# Patient Record
Sex: Female | Born: 1980 | Race: White | Hispanic: No | Marital: Married | Smoking: Never smoker
Health system: Southern US, Community
[De-identification: ages and names within clinical notes are randomized; demographics above are authoritative.]

## PROBLEM LIST (undated history)

## (undated) DIAGNOSIS — N289 Disorder of kidney and ureter, unspecified: Secondary | ICD-10-CM

## (undated) DIAGNOSIS — I10 Essential (primary) hypertension: Secondary | ICD-10-CM

## (undated) DIAGNOSIS — I340 Nonrheumatic mitral (valve) insufficiency: Secondary | ICD-10-CM

## (undated) DIAGNOSIS — G43909 Migraine, unspecified, not intractable, without status migrainosus: Secondary | ICD-10-CM

## (undated) DIAGNOSIS — N200 Calculus of kidney: Secondary | ICD-10-CM

## (undated) DIAGNOSIS — F419 Anxiety disorder, unspecified: Secondary | ICD-10-CM

## (undated) DIAGNOSIS — I071 Rheumatic tricuspid insufficiency: Secondary | ICD-10-CM

## (undated) HISTORY — PX: LITHOTRIPSY: SUR834

## (undated) HISTORY — DX: Anxiety disorder, unspecified: F41.9

---
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
## 2002-02-21 ENCOUNTER — Emergency Department (HOSPITAL_COMMUNITY): Admission: EM | Admit: 2002-02-21 | Discharge: 2002-02-21 | Payer: Self-pay | Admitting: Emergency Medicine

## 2002-12-22 ENCOUNTER — Emergency Department (HOSPITAL_COMMUNITY): Admission: EM | Admit: 2002-12-22 | Discharge: 2002-12-22 | Payer: Self-pay | Admitting: Emergency Medicine

## 2003-11-06 ENCOUNTER — Other Ambulatory Visit: Admission: RE | Admit: 2003-11-06 | Discharge: 2003-11-06 | Payer: Self-pay | Admitting: Obstetrics and Gynecology

## 2006-06-14 ENCOUNTER — Other Ambulatory Visit: Admission: RE | Admit: 2006-06-14 | Discharge: 2006-06-14 | Payer: Self-pay | Admitting: Obstetrics and Gynecology

## 2006-08-02 ENCOUNTER — Emergency Department (HOSPITAL_COMMUNITY): Admission: EM | Admit: 2006-08-02 | Discharge: 2006-08-02 | Payer: Self-pay | Admitting: Emergency Medicine

## 2006-08-30 ENCOUNTER — Ambulatory Visit (HOSPITAL_COMMUNITY): Admission: RE | Admit: 2006-08-30 | Discharge: 2006-08-30 | Payer: Self-pay | Admitting: Obstetrics and Gynecology

## 2006-09-04 ENCOUNTER — Emergency Department (HOSPITAL_COMMUNITY): Admission: EM | Admit: 2006-09-04 | Discharge: 2006-09-04 | Payer: Self-pay | Admitting: Emergency Medicine

## 2006-09-05 ENCOUNTER — Emergency Department (HOSPITAL_COMMUNITY): Admission: EM | Admit: 2006-09-05 | Discharge: 2006-09-05 | Payer: Self-pay | Admitting: Emergency Medicine

## 2007-01-07 ENCOUNTER — Inpatient Hospital Stay (HOSPITAL_COMMUNITY): Admission: AD | Admit: 2007-01-07 | Discharge: 2007-01-12 | Payer: Self-pay | Admitting: Obstetrics and Gynecology

## 2007-01-09 DIAGNOSIS — O1490 Unspecified pre-eclampsia, unspecified trimester: Secondary | ICD-10-CM

## 2007-01-17 ENCOUNTER — Inpatient Hospital Stay (HOSPITAL_COMMUNITY): Admission: AD | Admit: 2007-01-17 | Discharge: 2007-01-17 | Payer: Self-pay | Admitting: Obstetrics and Gynecology

## 2007-01-22 ENCOUNTER — Observation Stay (HOSPITAL_COMMUNITY): Admission: AD | Admit: 2007-01-22 | Discharge: 2007-01-23 | Payer: Self-pay | Admitting: Obstetrics and Gynecology

## 2007-02-20 ENCOUNTER — Other Ambulatory Visit: Admission: RE | Admit: 2007-02-20 | Discharge: 2007-02-20 | Payer: Self-pay | Admitting: Obstetrics and Gynecology

## 2007-06-15 ENCOUNTER — Emergency Department (HOSPITAL_COMMUNITY): Admission: EM | Admit: 2007-06-15 | Discharge: 2007-06-15 | Payer: Self-pay | Admitting: Emergency Medicine

## 2007-11-24 ENCOUNTER — Emergency Department (HOSPITAL_COMMUNITY): Admission: EM | Admit: 2007-11-24 | Discharge: 2007-11-24 | Payer: Self-pay | Admitting: Emergency Medicine

## 2007-12-15 ENCOUNTER — Emergency Department (HOSPITAL_COMMUNITY): Admission: EM | Admit: 2007-12-15 | Discharge: 2007-12-15 | Payer: Self-pay | Admitting: Emergency Medicine

## 2007-12-19 ENCOUNTER — Inpatient Hospital Stay (HOSPITAL_COMMUNITY): Admission: EM | Admit: 2007-12-19 | Discharge: 2007-12-21 | Payer: Self-pay | Admitting: Urology

## 2008-01-11 ENCOUNTER — Ambulatory Visit (HOSPITAL_BASED_OUTPATIENT_CLINIC_OR_DEPARTMENT_OTHER): Admission: RE | Admit: 2008-01-11 | Discharge: 2008-01-11 | Payer: Self-pay | Admitting: Urology

## 2008-04-10 ENCOUNTER — Emergency Department (HOSPITAL_COMMUNITY): Admission: EM | Admit: 2008-04-10 | Discharge: 2008-04-10 | Payer: Self-pay | Admitting: Emergency Medicine

## 2008-04-11 ENCOUNTER — Emergency Department (HOSPITAL_COMMUNITY): Admission: EM | Admit: 2008-04-11 | Discharge: 2008-04-11 | Payer: Self-pay | Admitting: Emergency Medicine

## 2008-04-15 ENCOUNTER — Emergency Department (HOSPITAL_COMMUNITY): Admission: EM | Admit: 2008-04-15 | Discharge: 2008-04-15 | Payer: Self-pay | Admitting: Emergency Medicine

## 2008-04-16 ENCOUNTER — Inpatient Hospital Stay (HOSPITAL_COMMUNITY): Admission: AD | Admit: 2008-04-16 | Discharge: 2008-04-21 | Payer: Self-pay | Admitting: Obstetrics and Gynecology

## 2008-04-16 ENCOUNTER — Encounter: Payer: Self-pay | Admitting: Emergency Medicine

## 2008-05-08 ENCOUNTER — Ambulatory Visit: Payer: Self-pay | Admitting: Obstetrics and Gynecology

## 2008-05-09 ENCOUNTER — Encounter: Payer: Self-pay | Admitting: Obstetrics and Gynecology

## 2008-05-09 LAB — CONVERTED CEMR LAB
HCT: 38.2 % (ref 36.0–46.0)
Hemoglobin: 12.8 g/dL (ref 12.0–15.0)
MCV: 91 fL (ref 78.0–100.0)
RDW: 14.2 % (ref 11.5–15.5)

## 2008-05-29 ENCOUNTER — Ambulatory Visit (HOSPITAL_COMMUNITY): Admission: RE | Admit: 2008-05-29 | Discharge: 2008-05-29 | Payer: Self-pay | Admitting: Family Medicine

## 2008-09-16 ENCOUNTER — Emergency Department (HOSPITAL_COMMUNITY): Admission: EM | Admit: 2008-09-16 | Discharge: 2008-09-17 | Payer: Self-pay | Admitting: Emergency Medicine

## 2008-09-24 ENCOUNTER — Emergency Department (HOSPITAL_COMMUNITY): Admission: EM | Admit: 2008-09-24 | Discharge: 2008-09-24 | Payer: Self-pay | Admitting: Emergency Medicine

## 2008-12-13 ENCOUNTER — Emergency Department (HOSPITAL_COMMUNITY): Admission: EM | Admit: 2008-12-13 | Discharge: 2008-12-13 | Payer: Self-pay | Admitting: Emergency Medicine

## 2009-04-13 ENCOUNTER — Inpatient Hospital Stay (HOSPITAL_COMMUNITY): Admission: AD | Admit: 2009-04-13 | Discharge: 2009-04-13 | Payer: Self-pay | Admitting: Obstetrics and Gynecology

## 2009-05-07 ENCOUNTER — Emergency Department (HOSPITAL_COMMUNITY): Admission: EM | Admit: 2009-05-07 | Discharge: 2009-05-07 | Payer: Self-pay | Admitting: Emergency Medicine

## 2009-05-20 ENCOUNTER — Ambulatory Visit: Payer: Self-pay | Admitting: Obstetrics and Gynecology

## 2009-05-21 ENCOUNTER — Emergency Department (HOSPITAL_COMMUNITY): Admission: EM | Admit: 2009-05-21 | Discharge: 2009-05-21 | Payer: Self-pay | Admitting: Emergency Medicine

## 2009-10-09 IMAGING — CR DG CHEST 2V
2 series · 2 of 2 positions shown · non-contrast
Comparison: None

CLINICAL DATA: Elevated blood pressure, dizziness and headaches.

CHEST - 2 VIEW

[w chest pa]
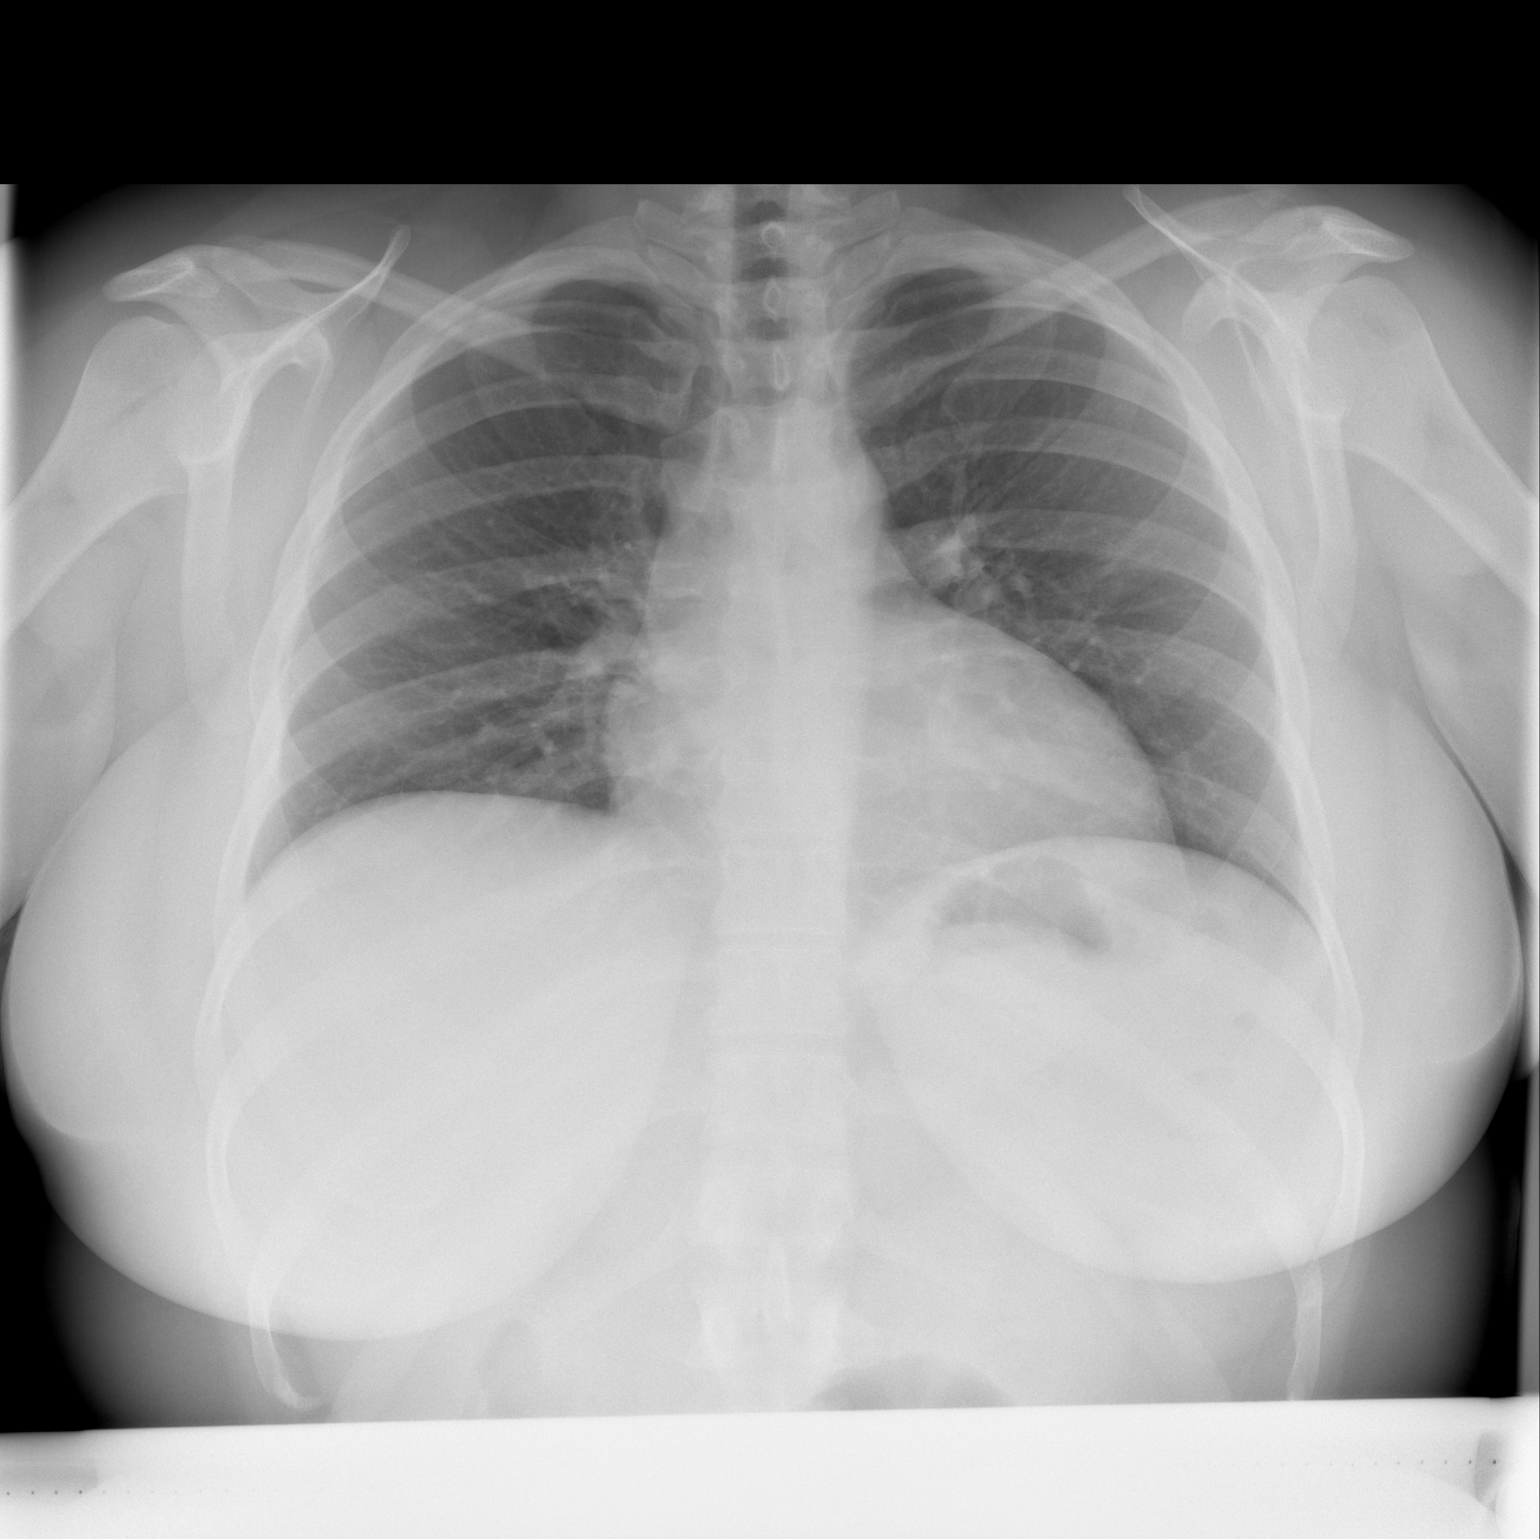

[w chest lat]
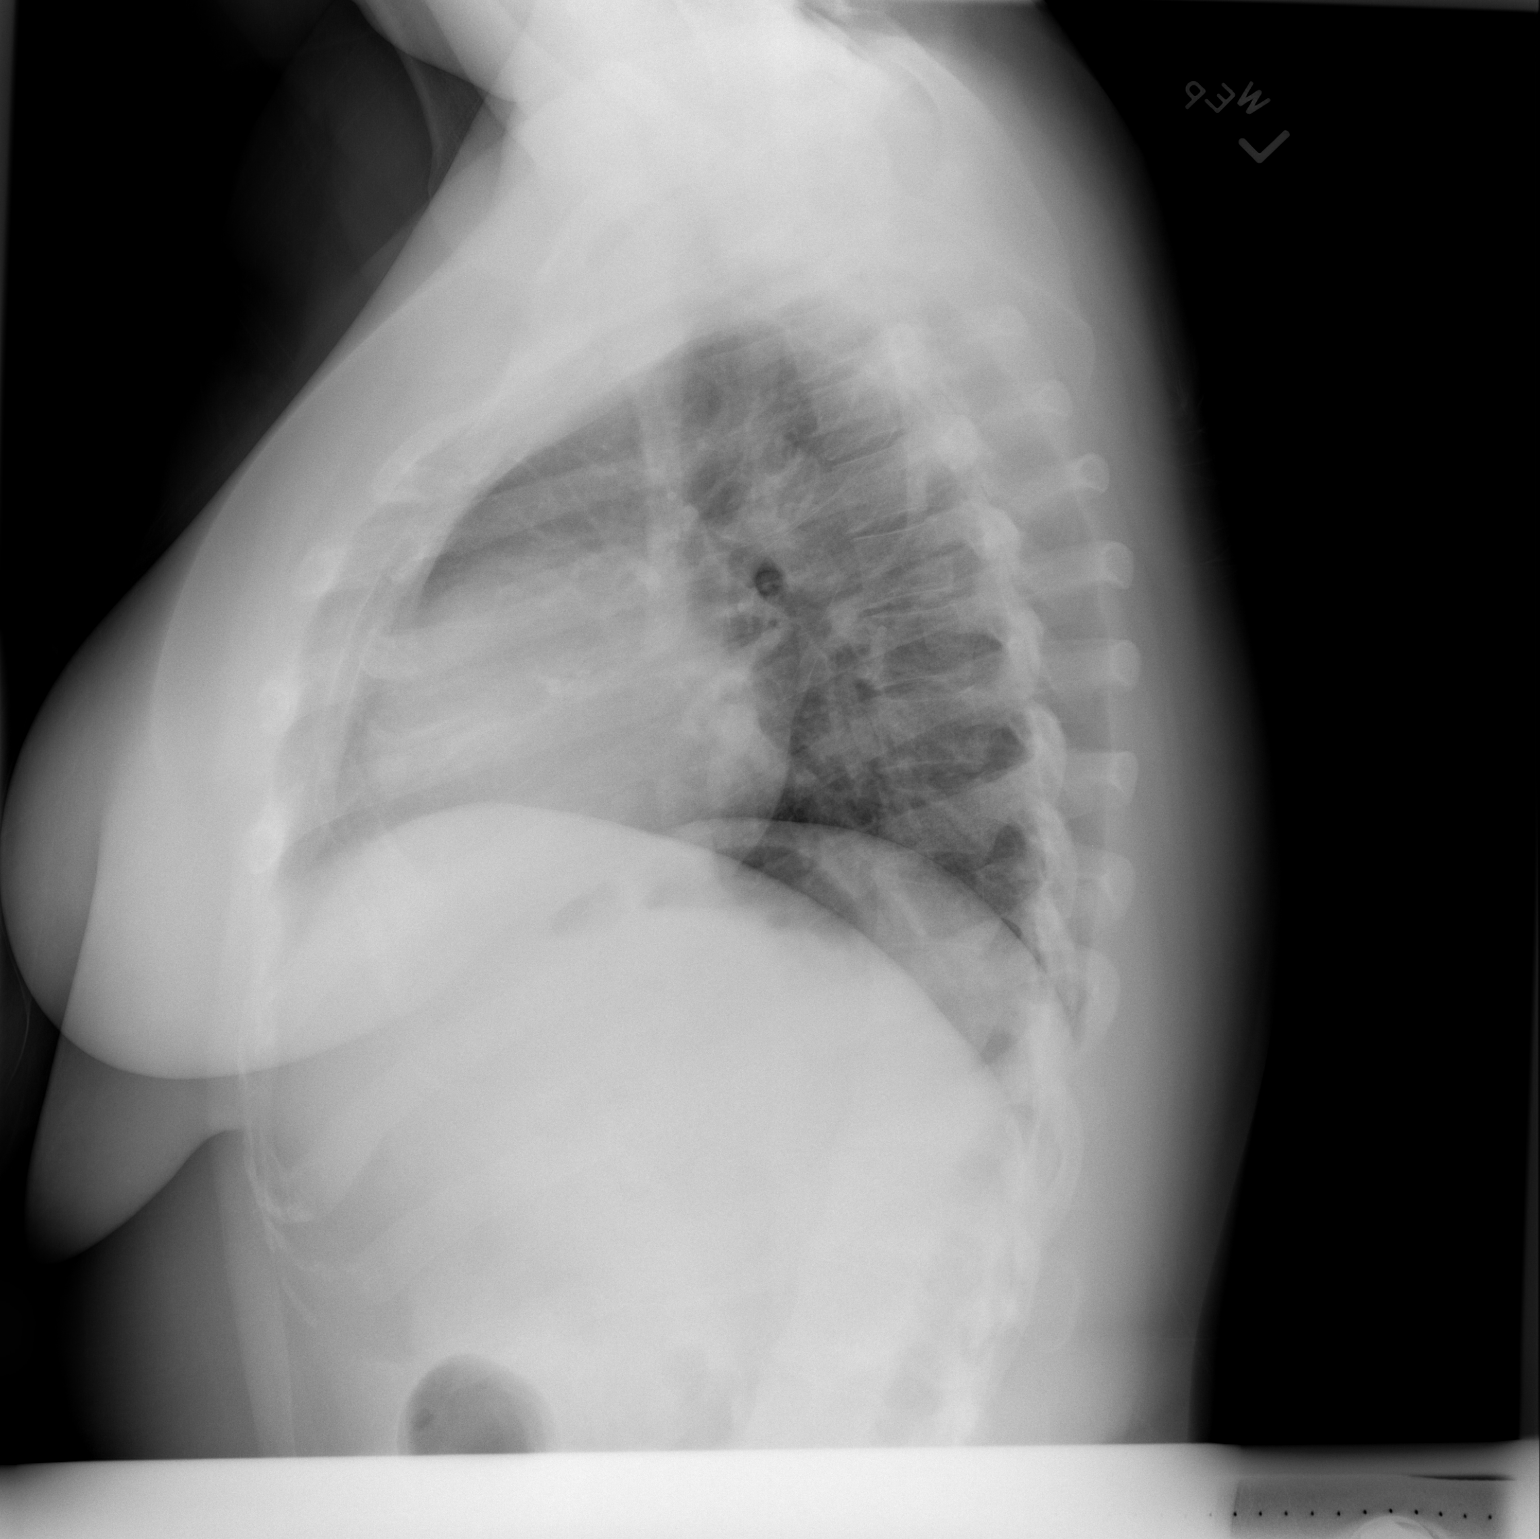

[2 of 2 positions shown; findings below may reference images not displayed]

FINDINGS: There are low lung volumes.  The heart size and
mediastinal contours are normal.  The lungs are clear.  There is no
pleural effusion or pneumothorax.  No acute osseous findings are
demonstrated.
IMPRESSION: No active cardiopulmonary process.

## 2010-01-05 IMAGING — CR DG CHEST 2V
2 series · 2 of 2 positions shown · non-contrast
Comparison: 09/16/2008.

CLINICAL DATA: 27-year-old female with right breast pain for 2
days.

CHEST - 2 VIEW

[w chest pa]
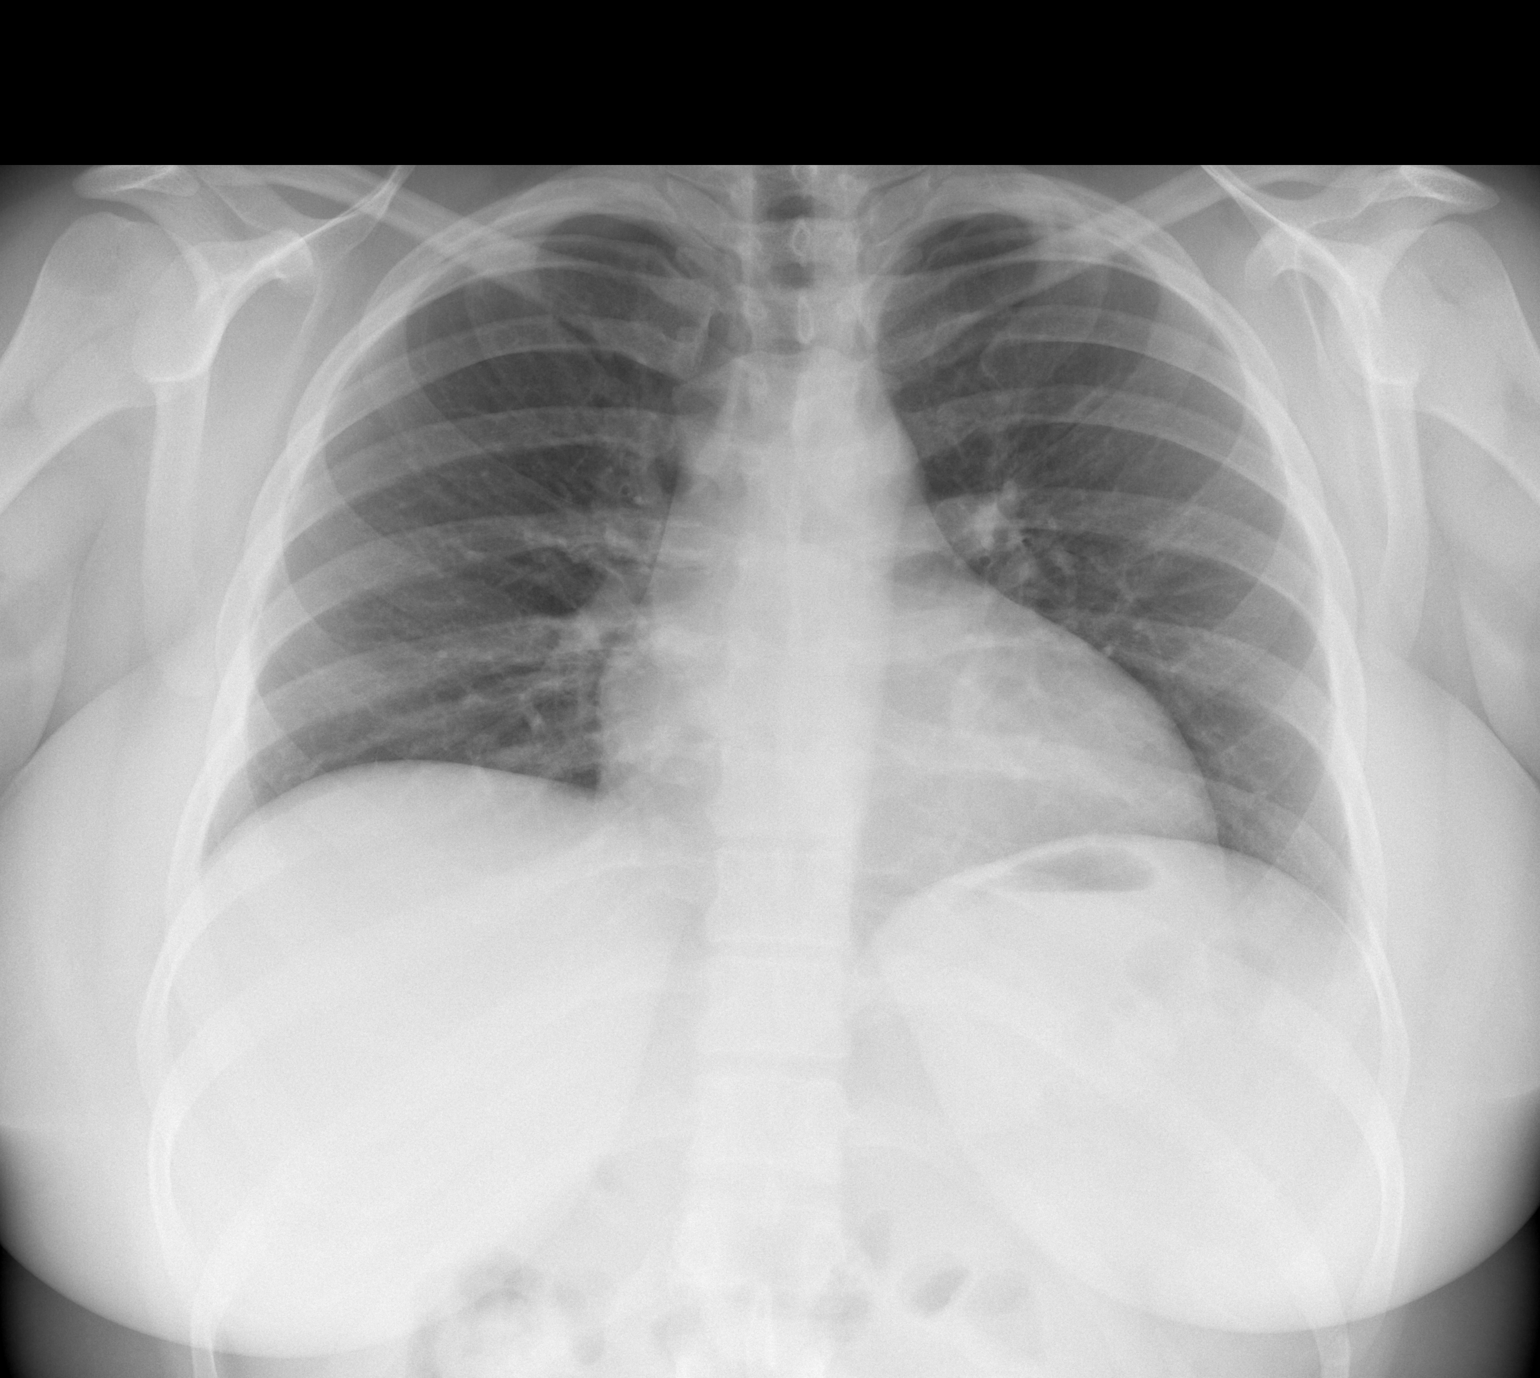

[w chest lat]
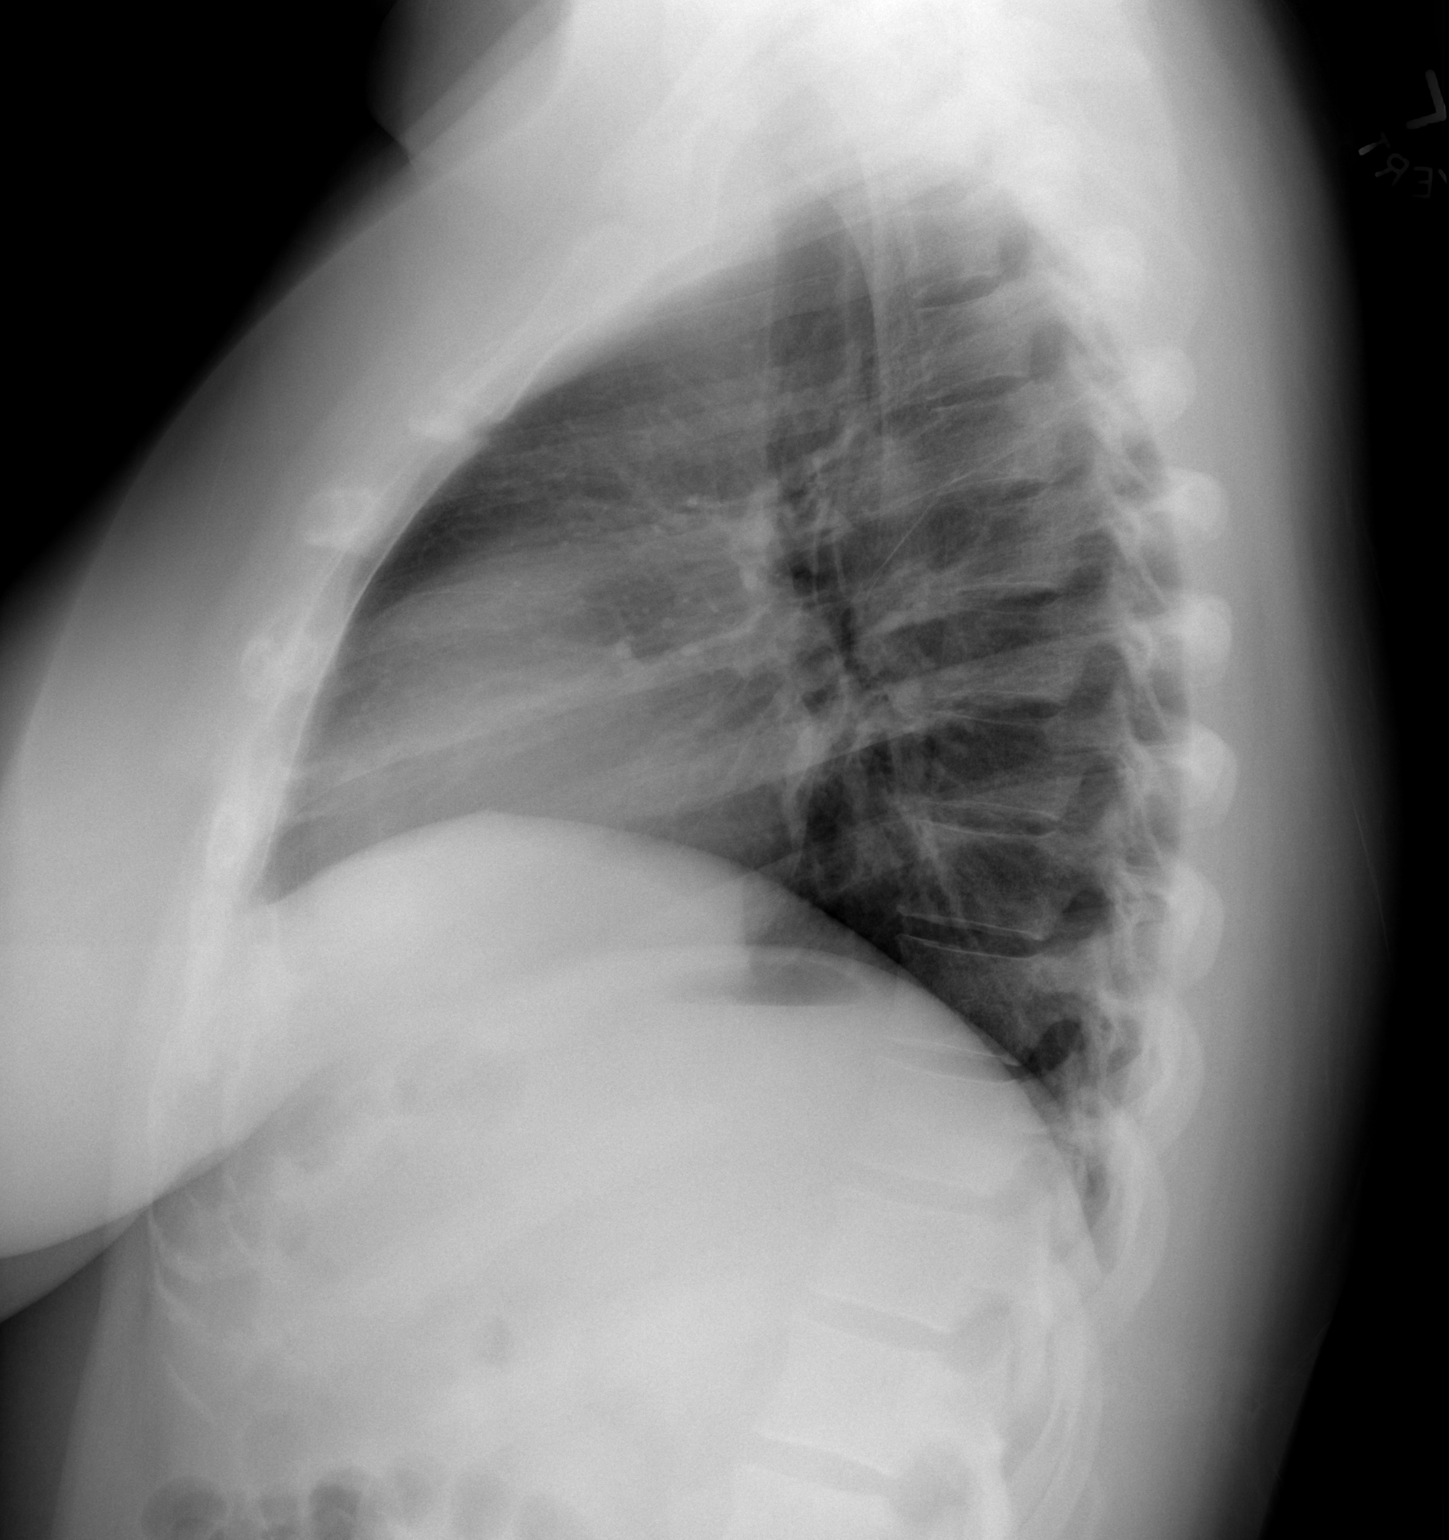

[2 of 2 positions shown; findings below may reference images not displayed]

FINDINGS: Stable low lung volumes.  Cardiac size and mediastinal
contours are within normal limits.  Visualized tracheal air column
is within normal limits.  The lungs remain clear.  No pneumothorax.
No acute osseous abnormality identified.
IMPRESSION: No acute cardiopulmonary abnormality.

## 2010-04-25 ENCOUNTER — Encounter: Payer: Self-pay | Admitting: *Deleted

## 2010-04-26 ENCOUNTER — Encounter: Payer: Self-pay | Admitting: Urology

## 2010-04-28 ENCOUNTER — Emergency Department (HOSPITAL_COMMUNITY)
Admission: EM | Admit: 2010-04-28 | Discharge: 2010-04-28 | Payer: Self-pay | Source: Home / Self Care | Admitting: Emergency Medicine

## 2010-04-28 LAB — RAPID STREP SCREEN (MED CTR MEBANE ONLY): Streptococcus, Group A Screen (Direct): NEGATIVE

## 2010-06-20 LAB — WET PREP, GENITAL
Trich, Wet Prep: NONE SEEN
Yeast Wet Prep HPF POC: NONE SEEN

## 2010-06-20 LAB — URINALYSIS, ROUTINE W REFLEX MICROSCOPIC
Glucose, UA: NEGATIVE mg/dL
Ketones, ur: NEGATIVE mg/dL
Leukocytes, UA: NEGATIVE
pH: 7 (ref 5.0–8.0)

## 2010-06-20 LAB — URINE MICROSCOPIC-ADD ON

## 2010-06-20 LAB — GC/CHLAMYDIA PROBE AMP, GENITAL: GC Probe Amp, Genital: NEGATIVE

## 2010-06-20 LAB — CBC
Hemoglobin: 13.7 g/dL (ref 12.0–15.0)
MCV: 96.2 fL (ref 78.0–100.0)
RBC: 4.16 MIL/uL (ref 3.87–5.11)
WBC: 6.1 10*3/uL (ref 4.0–10.5)

## 2010-06-20 LAB — HCG, QUANTITATIVE, PREGNANCY: hCG, Beta Chain, Quant, S: <2 m[IU]/mL (ref <5)

## 2010-06-23 LAB — URINALYSIS, ROUTINE W REFLEX MICROSCOPIC
Bilirubin Urine: NEGATIVE
Glucose, UA: NEGATIVE mg/dL
Hgb urine dipstick: NEGATIVE
Hgb urine dipstick: NEGATIVE
Ketones, ur: NEGATIVE mg/dL
Nitrite: NEGATIVE
Protein, ur: NEGATIVE mg/dL
Specific Gravity, Urine: 1.023 (ref 1.005–1.030)
Urobilinogen, UA: 1 mg/dL (ref 0.0–1.0)

## 2010-06-23 LAB — URINE MICROSCOPIC-ADD ON

## 2010-06-23 LAB — COMPREHENSIVE METABOLIC PANEL
Alkaline Phosphatase: 97 U/L (ref 39–117)
BUN: 13 mg/dL (ref 6–23)
Calcium: 8.9 mg/dL (ref 8.4–10.5)
Glucose, Bld: 91 mg/dL (ref 70–99)
Potassium: 4.2 mEq/L (ref 3.5–5.1)
Total Protein: 6.9 g/dL (ref 6.0–8.3)

## 2010-06-23 LAB — CBC
HCT: 39.9 % (ref 36.0–46.0)
Hemoglobin: 13.9 g/dL (ref 12.0–15.0)
MCHC: 34.8 g/dL (ref 30.0–36.0)
MCV: 97.9 fL (ref 78.0–100.0)
RDW: 13 % (ref 11.5–15.5)

## 2010-06-23 LAB — DIFFERENTIAL
Basophils Relative: 0 % (ref 0–1)
Monocytes Relative: 6 % (ref 3–12)
Neutro Abs: 5.2 10*3/uL (ref 1.7–7.7)
Neutrophils Relative %: 62 % (ref 43–77)

## 2010-06-23 LAB — POCT PREGNANCY, URINE: Preg Test, Ur: NEGATIVE

## 2010-07-09 ENCOUNTER — Emergency Department (HOSPITAL_COMMUNITY): Payer: BC Managed Care – PPO

## 2010-07-09 ENCOUNTER — Emergency Department (HOSPITAL_COMMUNITY)
Admission: EM | Admit: 2010-07-09 | Discharge: 2010-07-09 | Disposition: A | Discharge status: Home / Self Care | Payer: BC Managed Care – PPO | Attending: Emergency Medicine | Admitting: Emergency Medicine

## 2010-07-09 DIAGNOSIS — R109 Unspecified abdominal pain: Secondary | ICD-10-CM | POA: Insufficient documentation

## 2010-07-09 DIAGNOSIS — I10 Essential (primary) hypertension: Secondary | ICD-10-CM | POA: Insufficient documentation

## 2010-07-09 DIAGNOSIS — N201 Calculus of ureter: Secondary | ICD-10-CM | POA: Insufficient documentation

## 2010-07-09 LAB — BASIC METABOLIC PANEL
CO2: 25 mEq/L (ref 19–32)
Calcium: 9.3 mg/dL (ref 8.4–10.5)
Chloride: 104 mEq/L (ref 96–112)
GFR calc Af Amer: >60 mL/min (ref >60)
Sodium: 138 mEq/L (ref 135–145)

## 2010-07-09 LAB — CBC
Hemoglobin: 13.8 g/dL (ref 12.0–15.0)
MCHC: 32.8 g/dL (ref 30.0–36.0)
Platelets: 307 10*3/uL (ref 150–400)
RBC: 4.43 MIL/uL (ref 3.87–5.11)

## 2010-07-09 LAB — URINALYSIS, ROUTINE W REFLEX MICROSCOPIC
Bilirubin Urine: NEGATIVE
Glucose, UA: NEGATIVE mg/dL
Specific Gravity, Urine: 1.022 (ref 1.005–1.030)
pH: 6.5 (ref 5.0–8.0)

## 2010-07-09 LAB — DIFFERENTIAL
Basophils Absolute: 0 10*3/uL (ref 0.0–0.1)
Basophils Relative: 0 % (ref 0–1)
Neutro Abs: 6.3 10*3/uL (ref 1.7–7.7)
Neutrophils Relative %: 60 % (ref 43–77)

## 2010-07-09 LAB — URINE MICROSCOPIC-ADD ON

## 2010-07-10 LAB — URINE CULTURE
Colony Count: 60000
Culture  Setup Time: 201204060837

## 2010-07-12 LAB — DIFFERENTIAL
Basophils Absolute: 0 10*3/uL (ref 0.0–0.1)
Basophils Absolute: 0.1 10*3/uL (ref 0.0–0.1)
Basophils Relative: 1 % (ref 0–1)
Eosinophils Absolute: 0.1 10*3/uL (ref 0.0–0.7)
Eosinophils Absolute: 0.1 10*3/uL (ref 0.0–0.7)
Eosinophils Relative: 1 % (ref 0–5)
Eosinophils Relative: 1 % (ref 0–5)
Lymphocytes Relative: 33 % (ref 12–46)
Monocytes Absolute: 0.5 10*3/uL (ref 0.1–1.0)
Monocytes Relative: 6 % (ref 3–12)
Neutro Abs: 5 10*3/uL (ref 1.7–7.7)
Neutrophils Relative %: 60 % (ref 43–77)

## 2010-07-12 LAB — COMPREHENSIVE METABOLIC PANEL
ALT: 32 U/L (ref 0–35)
AST: 31 U/L (ref 0–37)
Albumin: 3.8 g/dL (ref 3.5–5.2)
Alkaline Phosphatase: 120 U/L — ABNORMAL HIGH (ref 39–117)
Chloride: 104 mEq/L (ref 96–112)
GFR calc Af Amer: >60 mL/min (ref >60)
Potassium: 3.5 mEq/L (ref 3.5–5.1)
Sodium: 138 mEq/L (ref 135–145)
Total Bilirubin: 0.3 mg/dL (ref 0.3–1.2)
Total Protein: 7.2 g/dL (ref 6.0–8.3)

## 2010-07-12 LAB — POCT I-STAT, CHEM 8
Calcium, Ion: 1.17 mmol/L (ref 1.12–1.32)
Calcium, Ion: 1.04 mmol/L — ABNORMAL LOW (ref 1.12–1.32)
Chloride: 103 mEq/L (ref 96–112)
Glucose, Bld: 100 mg/dL — ABNORMAL HIGH (ref 70–99)
HCT: 40 % (ref 36.0–46.0)
HCT: 43 % (ref 36.0–46.0)
Sodium: 136 mEq/L (ref 135–145)
TCO2: 27 mmol/L (ref 0–100)
TCO2: 21 mmol/L (ref 0–100)

## 2010-07-12 LAB — CBC
HCT: 38.7 % (ref 36.0–46.0)
HCT: 41.9 % (ref 36.0–46.0)
MCV: 92.6 fL (ref 78.0–100.0)
Platelets: 285 10*3/uL (ref 150–400)
Platelets: 309 10*3/uL (ref 150–400)
RDW: 13.9 % (ref 11.5–15.5)
RDW: 14 % (ref 11.5–15.5)
WBC: 7.9 10*3/uL (ref 4.0–10.5)

## 2010-07-12 LAB — URINALYSIS, ROUTINE W REFLEX MICROSCOPIC
Bilirubin Urine: NEGATIVE
Ketones, ur: NEGATIVE mg/dL
Nitrite: NEGATIVE
Specific Gravity, Urine: 1.006 (ref 1.005–1.030)
pH: 7 (ref 5.0–8.0)

## 2010-07-12 LAB — URINE MICROSCOPIC-ADD ON

## 2010-07-12 LAB — PREGNANCY, URINE: Preg Test, Ur: NEGATIVE

## 2010-07-12 LAB — POCT PREGNANCY, URINE: Preg Test, Ur: NEGATIVE

## 2010-07-13 ENCOUNTER — Emergency Department (HOSPITAL_COMMUNITY)
Admission: EM | Admit: 2010-07-13 | Discharge: 2010-07-13 | Disposition: A | Discharge status: Home / Self Care | Payer: BC Managed Care – PPO | Attending: Emergency Medicine | Admitting: Emergency Medicine

## 2010-07-13 DIAGNOSIS — R112 Nausea with vomiting, unspecified: Secondary | ICD-10-CM | POA: Insufficient documentation

## 2010-07-13 DIAGNOSIS — Z87442 Personal history of urinary calculi: Secondary | ICD-10-CM | POA: Insufficient documentation

## 2010-07-13 DIAGNOSIS — N200 Calculus of kidney: Secondary | ICD-10-CM | POA: Insufficient documentation

## 2010-07-13 DIAGNOSIS — N390 Urinary tract infection, site not specified: Secondary | ICD-10-CM | POA: Insufficient documentation

## 2010-07-13 DIAGNOSIS — I10 Essential (primary) hypertension: Secondary | ICD-10-CM | POA: Insufficient documentation

## 2010-07-13 DIAGNOSIS — R109 Unspecified abdominal pain: Secondary | ICD-10-CM | POA: Insufficient documentation

## 2010-07-13 LAB — CBC
MCHC: 34 g/dL (ref 30.0–36.0)
Platelets: 303 10*3/uL (ref 150–400)
RDW: 13 % (ref 11.5–15.5)
WBC: 12.4 10*3/uL — ABNORMAL HIGH (ref 4.0–10.5)

## 2010-07-13 LAB — POCT PREGNANCY, URINE: Preg Test, Ur: NEGATIVE

## 2010-07-13 LAB — URINALYSIS, ROUTINE W REFLEX MICROSCOPIC
Nitrite: NEGATIVE
Protein, ur: 30 mg/dL — AB
Urobilinogen, UA: 0.2 mg/dL (ref 0.0–1.0)

## 2010-07-13 LAB — POCT I-STAT, CHEM 8
HCT: 42 % (ref 36.0–46.0)
Hemoglobin: 14.3 g/dL (ref 12.0–15.0)
Potassium: 4.1 mEq/L (ref 3.5–5.1)
TCO2: 24 mmol/L (ref 0–100)

## 2010-07-13 LAB — URINE MICROSCOPIC-ADD ON

## 2010-07-15 ENCOUNTER — Emergency Department (HOSPITAL_COMMUNITY)
Admission: EM | Admit: 2010-07-15 | Discharge: 2010-07-15 | Disposition: A | Discharge status: Home / Self Care | Payer: BC Managed Care – PPO | Attending: Emergency Medicine | Admitting: Emergency Medicine

## 2010-07-15 ENCOUNTER — Emergency Department (HOSPITAL_COMMUNITY): Payer: BC Managed Care – PPO

## 2010-07-15 DIAGNOSIS — R109 Unspecified abdominal pain: Secondary | ICD-10-CM | POA: Insufficient documentation

## 2010-07-15 DIAGNOSIS — I10 Essential (primary) hypertension: Secondary | ICD-10-CM | POA: Insufficient documentation

## 2010-07-15 DIAGNOSIS — R112 Nausea with vomiting, unspecified: Secondary | ICD-10-CM | POA: Insufficient documentation

## 2010-07-15 DIAGNOSIS — N200 Calculus of kidney: Secondary | ICD-10-CM | POA: Insufficient documentation

## 2010-07-15 DIAGNOSIS — Z87442 Personal history of urinary calculi: Secondary | ICD-10-CM | POA: Insufficient documentation

## 2010-07-15 LAB — BASIC METABOLIC PANEL
Calcium: 9.1 mg/dL (ref 8.4–10.5)
GFR calc Af Amer: 59 mL/min — ABNORMAL LOW (ref >60)
GFR calc non Af Amer: 49 mL/min — ABNORMAL LOW (ref >60)
Glucose, Bld: 97 mg/dL (ref 70–99)
Potassium: 3.6 mEq/L (ref 3.5–5.1)
Sodium: 138 mEq/L (ref 135–145)

## 2010-07-15 LAB — URINALYSIS, ROUTINE W REFLEX MICROSCOPIC
Nitrite: NEGATIVE
Specific Gravity, Urine: 1.021 (ref 1.005–1.030)
Urobilinogen, UA: 0.2 mg/dL (ref 0.0–1.0)
pH: 6.5 (ref 5.0–8.0)

## 2010-07-15 LAB — URINE MICROSCOPIC-ADD ON

## 2010-07-15 LAB — DIFFERENTIAL
Basophils Absolute: 0 10*3/uL (ref 0.0–0.1)
Basophils Relative: 0 % (ref 0–1)
Eosinophils Relative: 1 % (ref 0–5)
Lymphocytes Relative: 25 % (ref 12–46)
Monocytes Absolute: 0.7 10*3/uL (ref 0.1–1.0)
Neutro Abs: 6.2 10*3/uL (ref 1.7–7.7)

## 2010-07-15 LAB — CBC
HCT: 39 % (ref 36.0–46.0)
Hemoglobin: 13.1 g/dL (ref 12.0–15.0)
MCHC: 33.6 g/dL (ref 30.0–36.0)
RDW: 13.2 % (ref 11.5–15.5)
WBC: 9.4 10*3/uL (ref 4.0–10.5)

## 2010-07-16 LAB — URINE CULTURE: Colony Count: 35000

## 2010-07-19 LAB — WET PREP, GENITAL
Trich, Wet Prep: NONE SEEN
WBC, Wet Prep HPF POC: NONE SEEN

## 2010-07-19 LAB — URINALYSIS, ROUTINE W REFLEX MICROSCOPIC
Bilirubin Urine: NEGATIVE
Hgb urine dipstick: NEGATIVE
Ketones, ur: NEGATIVE mg/dL
Nitrite: NEGATIVE
Nitrite: NEGATIVE
Nitrite: NEGATIVE
Protein, ur: NEGATIVE mg/dL
Specific Gravity, Urine: 1.012 (ref 1.005–1.030)
Specific Gravity, Urine: 1.023 (ref 1.005–1.030)
Urobilinogen, UA: 0.2 mg/dL (ref 0.0–1.0)
Urobilinogen, UA: 0.2 mg/dL (ref 0.0–1.0)
Urobilinogen, UA: 1 mg/dL (ref 0.0–1.0)

## 2010-07-19 LAB — DIFFERENTIAL
Basophils Absolute: 0 10*3/uL (ref 0.0–0.1)
Basophils Absolute: 0 10*3/uL (ref 0.0–0.1)
Basophils Relative: 0 % (ref 0–1)
Basophils Relative: 0 % (ref 0–1)
Eosinophils Absolute: 0 10*3/uL (ref 0.0–0.7)
Eosinophils Absolute: 0 10*3/uL (ref 0.0–0.7)
Eosinophils Absolute: 0 10*3/uL (ref 0.0–0.7)
Eosinophils Absolute: 0 10*3/uL (ref 0.0–0.7)
Eosinophils Relative: 0 % (ref 0–5)
Eosinophils Relative: 0 % (ref 0–5)
Eosinophils Relative: 0 % (ref 0–5)
Lymphocytes Relative: 15 % (ref 12–46)
Lymphocytes Relative: 10 % — ABNORMAL LOW (ref 12–46)
Lymphocytes Relative: 12 % (ref 12–46)
Lymphs Abs: 1 10*3/uL (ref 0.7–4.0)
Lymphs Abs: 1.1 10*3/uL (ref 0.7–4.0)
Monocytes Absolute: 0.6 10*3/uL (ref 0.1–1.0)
Monocytes Relative: 6 % (ref 3–12)
Monocytes Relative: 5 % (ref 3–12)
Monocytes Relative: 7 % (ref 3–12)
Neutro Abs: 11.4 10*3/uL — ABNORMAL HIGH (ref 1.7–7.7)
Neutrophils Relative %: 79 % — ABNORMAL HIGH (ref 43–77)
Neutrophils Relative %: 81 % — ABNORMAL HIGH (ref 43–77)
Neutrophils Relative %: 88 % — ABNORMAL HIGH (ref 43–77)
Neutrophils Relative %: 88 % — ABNORMAL HIGH (ref 43–77)

## 2010-07-19 LAB — COMPREHENSIVE METABOLIC PANEL
ALT: 26 U/L (ref 0–35)
AST: 23 U/L (ref 0–37)
Alkaline Phosphatase: 117 U/L (ref 39–117)
CO2: 25 mEq/L (ref 19–32)
Calcium: 9.1 mg/dL (ref 8.4–10.5)
GFR calc Af Amer: >60 mL/min (ref >60)
Glucose, Bld: 108 mg/dL — ABNORMAL HIGH (ref 70–99)
Potassium: 3.6 mEq/L (ref 3.5–5.1)
Sodium: 137 mEq/L (ref 135–145)
Total Protein: 6.9 g/dL (ref 6.0–8.3)

## 2010-07-19 LAB — URINE MICROSCOPIC-ADD ON

## 2010-07-19 LAB — BASIC METABOLIC PANEL
BUN: 10 mg/dL (ref 6–23)
BUN: 13 mg/dL (ref 6–23)
CO2: 26 mEq/L (ref 19–32)
Chloride: 102 mEq/L (ref 96–112)
Creatinine, Ser: 0.86 mg/dL (ref 0.4–1.2)
GFR calc non Af Amer: >60 mL/min (ref >60)
GFR calc non Af Amer: >60 mL/min (ref >60)
Glucose, Bld: 124 mg/dL — ABNORMAL HIGH (ref 70–99)
Glucose, Bld: 86 mg/dL (ref 70–99)
Potassium: 3.4 mEq/L — ABNORMAL LOW (ref 3.5–5.1)
Potassium: 3.6 mEq/L (ref 3.5–5.1)
Sodium: 137 mEq/L (ref 135–145)

## 2010-07-19 LAB — CBC
HCT: 31.5 % — ABNORMAL LOW (ref 36.0–46.0)
HCT: 36.4 % (ref 36.0–46.0)
HCT: 36.8 % (ref 36.0–46.0)
Hemoglobin: 13.2 g/dL (ref 12.0–15.0)
Hemoglobin: 10.6 g/dL — ABNORMAL LOW (ref 12.0–15.0)
Hemoglobin: 12.6 g/dL (ref 12.0–15.0)
Hemoglobin: 13.1 g/dL (ref 12.0–15.0)
MCHC: 33.4 g/dL (ref 30.0–36.0)
MCHC: 33.7 g/dL (ref 30.0–36.0)
MCHC: 34.2 g/dL (ref 30.0–36.0)
MCV: 90.4 fL (ref 78.0–100.0)
MCV: 90.8 fL (ref 78.0–100.0)
MCV: 92.3 fL (ref 78.0–100.0)
Platelets: 275 10*3/uL (ref 150–400)
Platelets: 303 10*3/uL (ref 150–400)
RBC: 4.32 MIL/uL (ref 3.87–5.11)
RBC: 3.42 MIL/uL — ABNORMAL LOW (ref 3.87–5.11)
RBC: 4.26 MIL/uL (ref 3.87–5.11)
RDW: 13.5 % (ref 11.5–15.5)
RDW: 13.8 % (ref 11.5–15.5)
RDW: 13.8 % (ref 11.5–15.5)
WBC: 14.5 10*3/uL — ABNORMAL HIGH (ref 4.0–10.5)
WBC: 9.8 10*3/uL (ref 4.0–10.5)

## 2010-07-19 LAB — URINE CULTURE: Colony Count: 35000

## 2010-07-19 LAB — POCT I-STAT, CHEM 8
HCT: 41 % (ref 36.0–46.0)
Hemoglobin: 13.9 g/dL (ref 12.0–15.0)
Potassium: 3.6 mEq/L (ref 3.5–5.1)
Sodium: 139 mEq/L (ref 135–145)
TCO2: 26 mmol/L (ref 0–100)

## 2010-07-19 LAB — PREGNANCY, URINE: Preg Test, Ur: NEGATIVE

## 2010-07-19 LAB — LIPASE, BLOOD: Lipase: 16 U/L (ref 11–59)

## 2010-07-19 LAB — POCT PREGNANCY, URINE: Preg Test, Ur: NEGATIVE

## 2010-08-17 NOTE — H&P (Signed)
NAMEJoneen Ramirez           ACCOUNT NO.:  [id number]   MEDICAL RECORD NO.:  [id number]          PATIENT TYPE:  OBV   LOCATION:  9305                          [facility]   PHYSICIAN:  James A. Ashley Royalty, M.D.DATE OF BIRTH:  Mar 15, 1981   DATE OF ADMISSION:  01/22/2007  DATE OF DISCHARGE:                              HISTORY & PHYSICAL   The patient is a 30 year old gravida 1, para 1 who recently delivered  vaginally January 09, 2007.  At the time of her delivery she carried a  diagnosis of preeclampsia, and was on magnesium sulfate prophylaxis.  She went home on-or-about January 12, 2007.  The patient returned to the  office January 17, 2007 for a blood pressure check.  She was noted to  be hypertensive and asked to return maternity admissions for further  evaluation and therapy.  She did return to maternity admissions, and  blood pressures were noted to be consistently elevated.  The patient was  asked to be placed on outpatient observation in order to address the  issue.  Unfortunately she chose to check out against medical advice.   The patient returned today, to the office, also for blood pressure  check.  At this time she complains of a headache.  She denies any visual  disturbance, right upper quadrant or epigastric pain.   MEDICATIONS:  None.   PAST HISTORY MEDICAL HISTORY:  Medical--history of kidney stones.  Surgical--negative.   ALLERGIES:  PERTUSSIS   FAMILY HISTORY:  Noncontributory.   SOCIAL HISTORY:  The patient denies the use of tobacco or significant  alcohol.   REVIEW OF SYSTEMS:  Noncontributory.   PHYSICAL EXAMINATION:  GENERAL:  A well-developed, well-nourished,  pleasant female in no acute distress.  VITAL SIGNS:  Blood pressure 162/114.  HEENT: Normocephalic.  NECK: Supple without thyromegaly.  CHEST:  Lungs are clear.  CARDIAC:  Regular rate and rhythm.  ABDOMEN:  Soft and nontender without masses or organomegaly.  PELVIC:  Examination  deferred.  MUSCULOSKELETAL:  No significant edema.  DTRs 2+ equal   IMPRESSION:  1. Status post vaginal delivery January 09, 2007.  2. History of preeclampsia with recent pregnancy.  3. Hypertension--chronic versus preeclampsia.   PLAN:  The patient, once again, asked to go to maternity admissions and  be placed on outpatient observation.  She states, that at this time she  will comply without request.  Will check additional labs and provide  antihypertensive medication as indicated.      James A. Ashley Royalty, M.D.  Electronically Signed     JAM/MEDQ  D:  01/22/2007  T:  01/23/2007  Job:  [id number]

## 2010-08-17 NOTE — Op Note (Signed)
Sara Ramirez, Sara Ramirez NO.:  [id number]   MEDICAL RECORD NO.:  [id number]          PATIENT TYPE:  INP   LOCATION:  1428                         FACILITY:  [facility]   PHYSICIAN:  Martina Sinner, MD DATE OF BIRTH:  30-Nov-1980   DATE OF PROCEDURE:  12/19/2007  DATE OF DISCHARGE:                               OPERATIVE REPORT   PREOPERATIVE DIAGNOSIS:  Infected right ureteral stone.   POSTOPERATIVE DIAGNOSIS:  Infected right ureteral stone.   SURGERY:  Cystoscopy, right retrograde ureterogram, insertion of right  ureteral stent.   HISTORY:  [name] has a 5-6 mm distal right ureteral vesical  stone.  She tried to pass over last month.  She developed a temperature  of 103 in the emergency room and she had refractory nausea and pain.   PROCEDURE IN DETAIL:  The patient was prepped and draped in the usual  fashion.  I initially scoped the patient with a 27 [place] scope.  She  was given IV Ancef and gentamicin and cultures were taken of the urine  and blood prior to surgery.  I inspected the bladder with a 21 [place]  scope.  There is no periureteral edema.  Bladder mucosa and trigone were  normal.  There is no stitch, foreign body or carcinoma.   Under cystoscopic and fluoroscopic guidance I gently passed a sensor  wire up to the mid ureter past the stone.  At first the sensor wire  would not go past the partially impacted stone.  Using a corkscrew type  maneuver gently it popped through.  I then passed an open-ended ureteral  catheter using the same maneuver to the mid ureter.  I removed the  sensor wire and did a gentle retrograde not to cause a back pressure  effect with infected urine.  I marked the level of the renal pelvis.  It  was not very dilated.  I then passed the sensor wire up into the upper  pole calyx.  There was a volcano like evacuation of bloody dark urine  from the stone with the wire in place.  I then placed under fluoroscopic  and  cystoscopic guidance quite easily a 24 cm x 6 French double-J stent.  I really did not leave much of a curl in the bladder and the stent just  reached beyond the ureteropelvic junction.  She had a small right renal  pelvis.  There is no question the stent was bridging the stone and I  suspect it will stay in good position.  She is only 5 foot 3 inches or 5  feet 4 inches and I was a bit surprised that the stent would not have  gone all the way into the renal pelvis or upper pole calyx.  The bladder  was emptied.  She had a little bit of bleeding around the ureter which  was minimal.  She was taken to the recovery room.  She will be admitted  and treated with IV antibiotics and observed for sepsis.           ______________________________  Martina Sinner, MD  Electronically Signed  SAM/MEDQ  D:  12/19/2007  T:  12/21/2007  Job:  [id number]

## 2010-08-17 NOTE — Discharge Summary (Signed)
NAMETERRIAN, RIDLON NO.:  [id number]   MEDICAL RECORD NO.:  [id number]          PATIENT TYPE:  INP   LOCATION:  9303                          [facility]ACILITY:  [facility]   PHYSICIAN:  Lazaro Arms, M.D.   DATE OF BIRTH:  [birth year]   DATE OF ADMISSION:  04/16/2008  DATE OF DISCHARGE:  04/21/2008                               DISCHARGE SUMMARY   DISCHARGE DIAGNOSES:  1. Left tuboovarian abscess.  2. Response to intravenous antibiotics.   Please refer to the history and physical for detail submission of the  hospital.   HOSPITAL COURSE:  The patient was admitted with significant left lower  quadrant pain and fever.  She was evaluated and emergently admitted by  Dr. Estanislado Pandy and she was found to have a left tuboovarian abscess 6.5 cm.  Her temperature was greater than 101 and her white blood cell count was  really pretty marginal at 9800 with minimally significant left shift  with 81% segs.  She was placed on Ceftin and Oxy and then transferred to  our service and was then placed on Zosyn.  I believe, she had a fever  elevation up to 100.7 in the morning of the 15, but has been afebrile  thereafter, daily.  Abdominal exams have been progressively better in  this morning, today.  Her discharge is basically benign.  She is eating  well, having no other complaints, and states she is having no need for  pain medicines as well.  She was discharged in the home in the morning  of the 18th, after 4+ days of having antibiotics, having been afebrile  for greater than 60 hours.  She is a short-time Cipro 500 mg take twice  a day for the next 14 days and will be followed up in the GYN clinic in  3 weeks.  She was given instructions and precautions for return to the  hospital prior to that time.      Lazaro Arms, M.D.  Electronically Signed     LHE/MEDQ  D:  04/21/2008  T:  04/21/2008  Job:  [id number]

## 2010-08-17 NOTE — Group Therapy Note (Signed)
NAMEALLIANA, MCAULIFF NO.:  [id number]   MEDICAL RECORD NO.:  [id number]          PATIENT TYPE:  WOC   LOCATION:  [facility]                   FACILITY:  [facility]   PHYSICIAN:  Argentina Donovan, MD        DATE OF BIRTH:  1980-07-11   DATE OF SERVICE:  05/08/2008                                  CLINIC NOTE   The patient is a 30 year old Caucasian female, gravida 1, para 1-0-0-1  who went in to the Maternity Admissions Office on April 16, 2008, was  admitted to the hospital with tubo-ovarian abscess, treated until  April 21, 2008, 5 days on IV antibiotics and sent home on Cipro for 10  days.  She has been asymptomatic since that time.  She came in today for  a followup.  We are going to follow her up at the end of February, i.e.  6 weeks from her acute episode with an ultrasound and have her come in  following that to evaluate her also with a CBC and a sed rate to  evaluate whether there is any sign of chronic inflammation at this  point.           ______________________________  Argentina Donovan, MD     PR/MEDQ  D:  05/08/2008  T:  05/09/2008  Job:  [id number]

## 2010-08-17 NOTE — Discharge Summary (Signed)
Sara Ramirez, Sara Ramirez NO.:  [id number]   MEDICAL RECORD NO.:  [id number]          PATIENT TYPE:  INP   LOCATION:  1428                         FACILITY:  [facility]   PHYSICIAN:  Martina Sinner, MD DATE OF BIRTH:  17-Jun-1980   DATE OF ADMISSION:  12/19/2007  DATE OF DISCHARGE:  12/21/2007                               DISCHARGE SUMMARY   ADMISSION DIAGNOSIS:  Right ureteral stone with probable urinary tract  infection.   DISCHARGE DIAGNOSIS:  Right ureteral stone with probable urinary tract  infection.   [name] tried to pass a stone over 1 month and went to the  [facility] emergency room on three occasions.  I was called when she was going  to have a distal symptomatic right ureterovesical stone.  She had  protracted vomiting and pain.  She was pale.  She spiked a temperature  to 103 when the emergency room, so she was taken to the operating room  and had a cystoscopy, retrograde urethrogram and placement of stent.   She did very well post procedure.  She was afebrile.  I kept her an  extra day to check her cultures and make certain she did not spike a  temperature.   Her vitals were good throughout her stay.  Her intake and output was  good.  Her white blood count was normal.  She had mixed organisms on  urine culture.   [name] was tolerating the stent very well.  Do's and don'ts were  discussed with the patient.  She will be seen in a week in clinic.  I  will get a KUB.  Will arrange ureteroscopy in approximately 2-3 weeks.  I sent her home with prescription of ciprofloxacin, and she had Percocet  from her previous emergency room visits.           ______________________________  Martina Sinner, MD  Electronically Signed     SAM/MEDQ  D:  12/21/2007  T:  12/23/2007  Job:  [id number]

## 2010-08-17 NOTE — Op Note (Signed)
NAMELEEONA, MCCARDLE         ACCOUNT NO.:  [id number]   MEDICAL RECORD NO.:  [id number]          PATIENT TYPE:  AMB   LOCATION:  N[facility]C                         FACILITY:  [facility]   PHYSICIAN:  Martina Sinner, MD DATE OF BIRTH:  25-Aug-1980   DATE OF PROCEDURE:  01/11/2008  DATE OF DISCHARGE:                               OPERATIVE REPORT   PREOPERATIVE DIAGNOSIS:  Right ureteral stone.   POSTOPERATIVE DIAGNOSIS:  Right ureteral stone.   PROCEDURE:  1. Cystourethroscopy.  2. Removal of right ureteral stent.  3. Intraoperative fluoroscopy with interpretation.  4. Right ureteroscopic stone manipulation with laser lithotripsy and      basket extraction.  5. Placement of right 6 x 24 contoured double-J stent placement.   ANESTHESIA:  General.   INDICATIONS FOR PROCEDURE:  [name] is a 30 year old female with  recent episode of renal colic.  She was taken to the operating room a  few weeks ago and had a ureteral stent placed.  She comes back today for  further definitive management of this stone.  Stone was not visible on  preoperative KUB.  Prior to the procedure, all risks, benefits,  consequences and concerns were discussed, and informed consent was  obtained.   PROCEDURE IN DETAIL:  The patient was brought to the operating room,  placed in supine position.  She was correctly identified by wristband,  and appropriate time-out was taken.  IV antibiotics were administered,  and general anesthesia was delivered.  Once adequately anesthetized, she  was placed in dorsal lithotomy position with great care taken to  minimize the risk of peripheral neuropathy or compartment syndrome.  Her  perineum was prepped and draped sterilely.  We began our procedure by  performing rigid cystourethroscopy with 22-French rigid cystoscopic  sheath and a 12-degree lens and normal saline as our irrigant.  Upon  entering the bladder, clear urine was identified.  No stone fragments  were  seen.  The distal curl of the right ureteral stent was grasped and  externalized with flexible graspers.  We then advanced a cystic  guidewire through the right ureteral stent into the right renal pelvis  using fluoroscopic guidance.  The stent was then removed completely.  We  then used a semi-rigid ureteroscope and easily cannulated the capacious  right ureteral orifice despite the marked bullous edema of the  urothelium surrounding the orifice.  We advanced the ureteroscope  without difficulty up to the level of the stone.  In the ureter just  distal to the stone was a small lip of mucosa.  There was no frank  stricture, but there was an obvious anatomic impediment to her stone  passage.  We then advanced a 360 nanometer laser fiber through the  ureteroscope and performed laser lithotripsy with settings of 5 Hz and  0.5 joules.  The laser was subsequently fragmented into multiple small  pieces.  We then used basket to remove all of the pieces completely.  Once this stone was completely removed, we then performed pan right  ureteroscopy, and no stone fragments were seen.  There was a little bit  of urothelial edema  at the site of the prior stone.  There was some  slight evidence that this may have been somewhat impacted.  There was no  perforation, no active bleeding, and likewise, we removed the  ureteroscope entirely.  We then back loaded the cystoscope over the  guide wire and successfully placed a right 6 x 24 contour double-J  stent.  We retrieved the stone  fragments from her bladder and emptied her bladder, and this marked the  end of our procedure.  She awoke from anesthesia and was taken to the  recovery room in stable condition.  There were no complications.  She  tolerated the procedure well.  Dr. Sherron Monday was present and  participated in all aspects of the case.     ______________________________  Delman Kitten, M.D.      Martina Sinner, MD  Electronically  Signed    DW/MEDQ  D:  01/11/2008  T:  01/12/2008  Job:  [id number]

## 2010-08-20 NOTE — Discharge Summary (Signed)
NAMEJoneen Ramirez           ACCOUNT NO.:  [id number]   MEDICAL RECORD NO.:  [id number]        PATIENT TYPE:  WOBV   LOCATION:                                [facility]   PHYSICIAN:  James A. Ashley Royalty, M.D.     DATE OF BIRTH:   DATE OF ADMISSION:  01/08/2007  DATE OF DISCHARGE:  01/12/2007                               DISCHARGE SUMMARY   DISCHARGE DIAGNOSES:  1. Intrauterine pregnancy at term, delivered.  2. Preeclampsia.  3. Anemia - asymptomatic.   CONSULTATIONS:  Dr. Rachel Bo (MFM).   OPERATIONS/SPECIAL PROCEDURES:  OB delivery with repair of first degree  laceration.   HISTORY OF PRESENT ILLNESS:  This is a 30 year old primigravida at 37  weeks 6 days gestation.  Prenatal care was complicated by a kidney stone  which was treated at [facility] as well as a positive chlamydia  culture which was also treated.  The patient presented to maternity  admissions complaining of headaches.  She denied other symptoms of  severe preeclampsia.  Blood pressures upon admission were 140/150  systolic and 90's diastolic.  Initial cervical examination per the nurse  revealed 1 cm dilatation and very high station.  The patient was  admitted to [facility] in order to observe the  patient for possible emerging preeclampsia.  For the remainder of the  History and Physical please see chart.   HOSPITAL COURSE:  The patient was admitted.  On January 08, 2007 the  patient was noted to have higher blood pressures.  A 24 urine had been  in progress, but January 08, 2007 the nurse reported that the urine was  inadvertently discarded by the nursing staff at approximately 6 p.m. or  perhaps at a later time as well.  The 24-hour urine was restarted at  approximately 9:10 p.m. on that date.  Due to the increasing blood  pressures the decision was made to give the patient magnesium sulfate  and initiate induction of labor.  The patient went on to deliver on  January 09, 2007.  The infant was a 6 pound 4 ounce female, Apgars 8 at 1  minute, 9 at 5 minutes.  Sent to newborn nursery.  Delivery was  accomplished by Dr. Sylvester Harder over an intact perineum.  There was a  first degree introital laceration which was repaired without difficulty.  Magnesium sulfate was continued for approximately 24 hours.  On January 10, 2007 blood pressures were noted to be relatively normal.  On January 12, 2007 the patient was felt to be a candidate for discharge home and  was discharged home afebrile and in satisfactory condition.   DISPOSITION:  The patient was asked to return to [facility] in approximately six weeks for postpartum evaluation.      James A. Ashley Royalty, M.D.  Electronically Signed     JAM/MEDQ  D:  03/15/2007  T:  03/15/2007  Job:  [id number]

## 2010-08-20 NOTE — Discharge Summary (Signed)
NAMEJoneen Ramirez           ACCOUNT NO.:  [id number]   MEDICAL RECORD NO.:  [id number]          PATIENT TYPE:  OBV   LOCATION:  9305                          [facility]   PHYSICIAN:  James A. Ashley Royalty, M.D.DATE OF BIRTH:  [birth year]   DATE OF ADMISSION:  01/22/2007  DATE OF DISCHARGE:  01/23/2007                               DISCHARGE SUMMARY   DISCHARGE DIAGNOSES:  1. Status post vaginal delivery January 09, 2007.  2. History of preeclampsia with recent pregnancy.  3. Hypertension, probably chronic.   OPERATIONS AND PROCEDURES:  None.   DISCHARGE MEDICATIONS:  Labetalol 200 mg p.o. b.i.d.   HISTORY AND PHYSICAL:  This is a 25-year gravida 1, para 1, who recent  delivered January 09, 2007.  At the time of her delivery she carried a  diagnosis of preeclampsia and was on magnesium sulfate prophylaxis.  She  went home on or about January 12, 2007.  She returned to the office  October 15 for a blood pressure check.  She was noted to be  hypertensive and asked to return to maternity admissions for further  evaluation and therapy.  She did return to maternity admissions and  blood pressures were noted to be consistently elevated.  The patient was  asked to be placed on outpatient observation in order to address the  tissue.  Unfortunately, she chose to check out against medical advice.   The patient returned on the day of admission to the office, also for a  blood pressure check.  At the time of admission she complained of a  headache.  She denied any other symptoms of severe preeclampsia.  Blood  pressure at that time was 162/114.   For the remainder of the history physical, please see chart.   HOSPITAL COURSE:  The patient was admitted [facility].  Admission laboratory studies were drawn.  She was given  labetalol.  PIH panel was negative.  Urine protein was negative.  On  January 23, 2007, blood pressures were noted be 124/80.  The patient was  felt to be a candidate for discharge and was discharged home afebrile  and in satisfactory condition.  She is asked to return to the office in  3 days for further evaluation and therapy.      James A. Ashley Royalty, M.D.  Electronically Signed     JAM/MEDQ  D:  03/15/2007  T:  03/15/2007  Job:  [id number]

## 2011-01-03 LAB — URINE CULTURE
Colony Count: >=100000
Culture: NO GROWTH
Culture: NO GROWTH

## 2011-01-03 LAB — CBC
HCT: 32.7 — ABNORMAL LOW
HCT: 38.8
Hemoglobin: 11.2 — ABNORMAL LOW
MCHC: 34
MCHC: 34.4
MCV: 91.3
MCV: 91.4
Platelets: 319
RBC: 3.57 — ABNORMAL LOW
RDW: 13.1
WBC: 11.1 — ABNORMAL HIGH

## 2011-01-03 LAB — POCT I-STAT, CHEM 8
Creatinine, Ser: 1
Hemoglobin: 13.3
Sodium: 137
TCO2: 27

## 2011-01-03 LAB — URINALYSIS, ROUTINE W REFLEX MICROSCOPIC
Bilirubin Urine: NEGATIVE
Protein, ur: 30 — AB
Urobilinogen, UA: 0.2

## 2011-01-03 LAB — DIFFERENTIAL
Eosinophils Absolute: 0
Eosinophils Relative: 0
Lymphs Abs: 0.6 — ABNORMAL LOW

## 2011-01-03 LAB — CULTURE, BLOOD (SINGLE): Culture: NO GROWTH

## 2011-01-03 LAB — URINE MICROSCOPIC-ADD ON

## 2011-01-03 LAB — BASIC METABOLIC PANEL
CO2: 27
Chloride: 105
GFR calc Af Amer: >60
Sodium: 135

## 2011-01-05 LAB — URINALYSIS, ROUTINE W REFLEX MICROSCOPIC
Nitrite: NEGATIVE
Specific Gravity, Urine: 1.026
Urobilinogen, UA: 0.2

## 2011-01-05 LAB — URINE MICROSCOPIC-ADD ON

## 2011-01-12 LAB — CBC
HCT: 31.7 — ABNORMAL LOW
HCT: 36.3
MCHC: 33.9
MCHC: 34.2
MCV: 91.5
MCV: 91.8
Platelets: 362
Platelets: 460 — ABNORMAL HIGH
RDW: 15 — ABNORMAL HIGH
WBC: 11.3 — ABNORMAL HIGH

## 2011-01-12 LAB — COMPREHENSIVE METABOLIC PANEL
AST: 17
AST: 18
Albumin: 3.7
BUN: 12
BUN: 12
CO2: 27
Calcium: 9.3
Calcium: 9.6
Creatinine, Ser: 0.89
Creatinine, Ser: 0.92
GFR calc Af Amer: >60
GFR calc Af Amer: >60
GFR calc non Af Amer: >60
Total Protein: 7.4

## 2011-01-12 LAB — URINALYSIS, ROUTINE W REFLEX MICROSCOPIC
Glucose, UA: NEGATIVE
Glucose, UA: NEGATIVE
Ketones, ur: NEGATIVE
Nitrite: NEGATIVE
Nitrite: NEGATIVE
Protein, ur: 30 — AB
Urobilinogen, UA: 0.2
pH: 6.5

## 2011-01-12 LAB — URINE MICROSCOPIC-ADD ON

## 2011-01-12 LAB — URIC ACID
Uric Acid, Serum: 7.4 — ABNORMAL HIGH
Uric Acid, Serum: 7.7 — ABNORMAL HIGH

## 2011-01-13 LAB — CBC
HCT: 22.9 — ABNORMAL LOW
HCT: 23.1 — ABNORMAL LOW
HCT: 26.5 — ABNORMAL LOW
Hemoglobin: 10.2 — ABNORMAL LOW
Hemoglobin: 8 — ABNORMAL LOW
Hemoglobin: 9.3 — ABNORMAL LOW
MCHC: 34.8
MCV: 90.7
MCV: 90.9
MCV: 92.8
Platelets: 252
Platelets: 259
Platelets: 259
Platelets: 265
RBC: 2.49 — ABNORMAL LOW
RBC: 3.22 — ABNORMAL LOW
RDW: 14
RDW: 14.4 — ABNORMAL HIGH
RDW: 14.8 — ABNORMAL HIGH
RDW: 14.9 — ABNORMAL HIGH
WBC: 10.2
WBC: 13.3 — ABNORMAL HIGH

## 2011-01-13 LAB — COMPREHENSIVE METABOLIC PANEL
ALT: 11
AST: 17
AST: 20
Albumin: 2 — ABNORMAL LOW
Albumin: 2 — ABNORMAL LOW
Albumin: 2.2 — ABNORMAL LOW
Albumin: 2.7 — ABNORMAL LOW
Alkaline Phosphatase: 112
Alkaline Phosphatase: 75
Alkaline Phosphatase: 88
Alkaline Phosphatase: 96
BUN: 2 — ABNORMAL LOW
BUN: 3 — ABNORMAL LOW
BUN: 4 — ABNORMAL LOW
CO2: 24
Chloride: 104
Chloride: 105
Chloride: 106
Chloride: 108
Creatinine, Ser: 0.67
GFR calc Af Amer: >60
GFR calc non Af Amer: >60
Glucose, Bld: 91
Potassium: 2.9 — ABNORMAL LOW
Potassium: 3 — ABNORMAL LOW
Potassium: 3.1 — ABNORMAL LOW
Potassium: 3.2 — ABNORMAL LOW
Sodium: 137
Sodium: 138
Total Bilirubin: 0.4
Total Bilirubin: 0.5
Total Bilirubin: 0.6
Total Bilirubin: 0.7
Total Protein: 5.2 — ABNORMAL LOW
Total Protein: 5.5 — ABNORMAL LOW
Total Protein: 6.1

## 2011-01-13 LAB — BASIC METABOLIC PANEL
BUN: 8
Chloride: 107
GFR calc non Af Amer: >60
Glucose, Bld: 83
Potassium: 4

## 2011-01-13 LAB — URINALYSIS, ROUTINE W REFLEX MICROSCOPIC
Bilirubin Urine: NEGATIVE
Hgb urine dipstick: NEGATIVE
Ketones, ur: NEGATIVE
Nitrite: NEGATIVE
Specific Gravity, Urine: 1.01
pH: 7

## 2011-01-13 LAB — CCBB MATERNAL DONOR DRAW

## 2011-01-13 LAB — URIC ACID
Uric Acid, Serum: 4.9
Uric Acid, Serum: 5

## 2011-01-13 LAB — LACTATE DEHYDROGENASE: LDH: 116

## 2011-01-13 LAB — MAGNESIUM: Magnesium: 5.1 — ABNORMAL HIGH

## 2011-01-20 LAB — DIFFERENTIAL
Basophils Absolute: 0
Basophils Relative: 0
Eosinophils Absolute: 0
Eosinophils Relative: 0
Lymphocytes Relative: 15
Lymphs Abs: 1.5
Monocytes Absolute: 0.3
Monocytes Relative: 3
Neutro Abs: 8.2 — ABNORMAL HIGH
Neutrophils Relative %: 82 — ABNORMAL HIGH

## 2011-01-20 LAB — URINE MICROSCOPIC-ADD ON

## 2011-01-20 LAB — CBC
HCT: 34.6 — ABNORMAL LOW
Hemoglobin: 12
MCHC: 34.6
MCV: 95.7
Platelets: 324
RBC: 3.61 — ABNORMAL LOW
RDW: 13.9
WBC: 10

## 2011-01-20 LAB — URINALYSIS, ROUTINE W REFLEX MICROSCOPIC
Bilirubin Urine: NEGATIVE
Glucose, UA: NEGATIVE
Glucose, UA: NEGATIVE
Hgb urine dipstick: NEGATIVE
Ketones, ur: 15 — AB
Ketones, ur: NEGATIVE
Nitrite: NEGATIVE
Nitrite: NEGATIVE
Protein, ur: 30 — AB
Protein, ur: 100 — AB
Specific Gravity, Urine: 1.023
Specific Gravity, Urine: 1.024
Specific Gravity, Urine: 1.026
Urobilinogen, UA: 0.2
Urobilinogen, UA: 0.2
pH: 6
pH: 6

## 2011-01-20 LAB — COMPREHENSIVE METABOLIC PANEL
ALT: 18
AST: 20
Albumin: 2.8 — ABNORMAL LOW
Alkaline Phosphatase: 64
BUN: 7
CO2: 20
Calcium: 8.8
Chloride: 106
Creatinine, Ser: 0.67
GFR calc Af Amer: >60
GFR calc non Af Amer: >60
Glucose, Bld: 86
Potassium: 3.4 — ABNORMAL LOW
Sodium: 137
Total Bilirubin: 0.7
Total Protein: 6.1

## 2011-01-20 LAB — POCT I-STAT CREATININE: Operator id: 151321

## 2011-01-20 LAB — I-STAT 8, (EC8 V) (CONVERTED LAB)
Acid-base deficit: 4 — ABNORMAL HIGH
Chloride: 107
Hemoglobin: 11.9 — ABNORMAL LOW
Potassium: 3.6
Sodium: 138
pH, Ven: 7.339 — ABNORMAL HIGH

## 2011-04-16 ENCOUNTER — Encounter (HOSPITAL_COMMUNITY): Payer: Self-pay | Admitting: *Deleted

## 2011-04-16 ENCOUNTER — Emergency Department (HOSPITAL_COMMUNITY)
Admission: EM | Admit: 2011-04-16 | Discharge: 2011-04-16 | Disposition: A | Discharge status: Home / Self Care | Payer: BC Managed Care – PPO | Attending: Emergency Medicine | Admitting: Emergency Medicine

## 2011-04-16 DIAGNOSIS — R1013 Epigastric pain: Secondary | ICD-10-CM | POA: Insufficient documentation

## 2011-04-16 DIAGNOSIS — R112 Nausea with vomiting, unspecified: Secondary | ICD-10-CM | POA: Insufficient documentation

## 2011-04-16 LAB — DIFFERENTIAL
Lymphocytes Relative: 3 % — ABNORMAL LOW (ref 12–46)
Lymphs Abs: 0.3 10*3/uL — ABNORMAL LOW (ref 0.7–4.0)
Monocytes Relative: 4 % (ref 3–12)
Neutro Abs: 10.7 10*3/uL — ABNORMAL HIGH (ref 1.7–7.7)
Neutrophils Relative %: 93 % — ABNORMAL HIGH (ref 43–77)

## 2011-04-16 LAB — URINALYSIS, ROUTINE W REFLEX MICROSCOPIC
Glucose, UA: NEGATIVE mg/dL
Hgb urine dipstick: NEGATIVE
Protein, ur: NEGATIVE mg/dL
Specific Gravity, Urine: 1.022 (ref 1.005–1.030)

## 2011-04-16 LAB — COMPREHENSIVE METABOLIC PANEL
AST: 16 U/L (ref 0–37)
BUN: 16 mg/dL (ref 6–23)
CO2: 22 mEq/L (ref 19–32)
Calcium: 8.9 mg/dL (ref 8.4–10.5)
Creatinine, Ser: 0.63 mg/dL (ref 0.50–1.10)
GFR calc Af Amer: >90 mL/min (ref >90)
GFR calc non Af Amer: >90 mL/min (ref >90)
Glucose, Bld: 117 mg/dL — ABNORMAL HIGH (ref 70–99)
Total Bilirubin: 0.9 mg/dL (ref 0.3–1.2)

## 2011-04-16 LAB — CBC
Hemoglobin: 13.7 g/dL (ref 12.0–15.0)
Platelets: 228 10*3/uL (ref 150–400)
RBC: 4.2 MIL/uL (ref 3.87–5.11)
WBC: 11.5 10*3/uL — ABNORMAL HIGH (ref 4.0–10.5)

## 2011-04-16 LAB — POCT PREGNANCY, URINE: Preg Test, Ur: NEGATIVE

## 2011-04-16 LAB — URINE MICROSCOPIC-ADD ON

## 2011-04-16 LAB — LIPASE, BLOOD: Lipase: 20 U/L (ref 11–59)

## 2011-04-16 MED ORDER — PROMETHAZINE HCL 25 MG/ML IJ SOLN
25.0000 mg | INTRAMUSCULAR | Status: AC
  Administered 2011-04-16: 25 mg via INTRAVENOUS
  Filled 2011-04-16: qty 1

## 2011-04-16 MED ORDER — PROMETHAZINE HCL 25 MG PO TABS: 25.0000 mg | ORAL_TABLET | Freq: Four times a day (QID) | ORAL | Status: DC | PRN

## 2011-04-16 MED ORDER — SODIUM CHLORIDE 0.9 % IV BOLUS (SEPSIS)
1000.0000 mL | Freq: Once | INTRAVENOUS | Status: AC
  Administered 2011-04-16: 1000 mL via INTRAVENOUS

## 2011-04-16 MED ORDER — FAMOTIDINE IN NACL 20-0.9 MG/50ML-% IV SOLN
20.0000 mg | Freq: Once | INTRAVENOUS | Status: AC
  Administered 2011-04-16: 20 mg via INTRAVENOUS
  Filled 2011-04-16: qty 50

## 2011-04-16 MED ORDER — KETOROLAC TROMETHAMINE 30 MG/ML IJ SOLN
INTRAMUSCULAR | Status: AC
  Filled 2011-04-16: qty 1

## 2011-04-16 MED ORDER — GI COCKTAIL ~~LOC~~
30.0000 mL | Freq: Once | ORAL | Status: AC
  Administered 2011-04-16: 30 mL via ORAL
  Filled 2011-04-16: qty 30

## 2011-04-16 MED ORDER — FAMOTIDINE 20 MG PO TABS: 20.0000 mg | ORAL_TABLET | Freq: Two times a day (BID) | ORAL | Status: DC

## 2011-04-16 MED ORDER — ONDANSETRON HCL 4 MG/2ML IJ SOLN
4.0000 mg | Freq: Once | INTRAMUSCULAR | Status: AC
  Administered 2011-04-16: 4 mg via INTRAVENOUS

## 2011-04-16 MED ORDER — KETOROLAC TROMETHAMINE 30 MG/ML IJ SOLN: 30.0000 mg | Freq: Once | INTRAMUSCULAR | Status: DC

## 2011-04-16 NOTE — ED Provider Notes (Signed)
History     CSN: [id number]  Arrival date & time 04/16/11  0045   First MD Initiated Contact with Patient 04/16/11 0056      Chief Complaint  Patient presents with  . Emesis    (Consider location/radiation/quality/duration/timing/severity/associated sxs/prior treatment) HPI Comments: Patient is a 31 year old female with no significant past medical history who states that in the last 18 hours she developed nausea and vomiting. This has been persistent throughout the day and this evening developed abdominal pain that is located in her epigastrium, feels like a tearing sensation, does not radiate to her back, her chest, or her lower abdomen, not associated with diarrhea, change in stools, change in urinary habits. She states that her symptoms are moderate to severe, nothing makes them better or worse and they are not associated with fevers chills or any other complaints. She does admit to having a child with nausea and vomiting for the last 2 days. She has had no medications prior to arrival. She denies alcohol use, history of acid reflux or over-the-counter NSAID use. She denies history of pancreatitis or gallstones. She has not had abdominal surgery.  The history is provided by the patient.    History reviewed. No pertinent past medical history.  History reviewed. No pertinent past surgical history.  History reviewed. No pertinent family history.  History  Substance Use Topics  . Smoking status: Never Smoker   . Smokeless tobacco: Not on file  . Alcohol Use: No    OB History    Grav Para Term Preterm Abortions TAB SAB Ect Mult Living                  Review of Systems  All other systems reviewed and are negative.    Allergies  Review of patient's allergies indicates no known allergies.  Home Medications   Current Outpatient Rx  Name Route Sig Dispense Refill  . FAMOTIDINE 20 MG PO TABS Oral Take 1 tablet (20 mg total) by mouth 2 (two) times daily. 30 tablet 0  .  PROMETHAZINE HCL 25 MG PO TABS Oral Take 1 tablet (25 mg total) by mouth every 6 (six) hours as needed for nausea. 12 tablet 0    BP 149/88  Pulse 108  Temp(Src) 98.5 F (36.9 C) (Oral)  Resp 16  SpO2 100%  Physical Exam  Nursing note and vitals reviewed. Constitutional: She appears well-developed and well-nourished. No distress.  HENT:  Head: Normocephalic and atraumatic.  Mouth/Throat: No oropharyngeal exudate.       Mucous membranes dehydrated slightly  Eyes: Conjunctivae and EOM are normal. Pupils are equal, round, and reactive to light. Right eye exhibits no discharge. Left eye exhibits no discharge. No scleral icterus.  Neck: Normal range of motion. Neck supple. No JVD present. No thyromegaly present.  Cardiovascular: Normal rate, regular rhythm, normal heart sounds and intact distal pulses.  Exam reveals no gallop and no friction rub.   No murmur heard. Pulmonary/Chest: Effort normal and breath sounds normal. No respiratory distress. She has no wheezes. She has no rales.  Abdominal: Soft. Bowel sounds are normal. She exhibits no distension and no mass. There is tenderness ( Tender to palpation in the epigastrium, left upper quadrant, right upper quadrant.).       No guarding, no rebound, no tenderness over the right lower quadrant suprapubic or left lower quadrant. Non-peritoneal exam  Musculoskeletal: Normal range of motion. She exhibits no edema and no tenderness.  Lymphadenopathy:    She has no  cervical adenopathy.  Neurological: She is alert. Coordination normal.  Skin: Skin is warm and dry. No rash noted. No erythema.  Psychiatric: She has a normal mood and affect. Her behavior is normal.    ED Course  Procedures (including critical care time)  Labs Reviewed  COMPREHENSIVE METABOLIC PANEL - Abnormal; Notable for the following:    Sodium 134 (*)    Glucose, Bld 117 (*)    All other components within normal limits  CBC - Abnormal; Notable for the following:    WBC  11.5 (*)    All other components within normal limits  DIFFERENTIAL - Abnormal; Notable for the following:    Neutrophils Relative 93 (*)    Neutro Abs 10.7 (*)    Lymphocytes Relative 3 (*)    Lymphs Abs 0.3 (*)    All other components within normal limits  URINALYSIS, ROUTINE W REFLEX MICROSCOPIC - Abnormal; Notable for the following:    APPearance CLOUDY (*)    Leukocytes, UA SMALL (*)    All other components within normal limits  LIPASE, BLOOD  POCT PREGNANCY, URINE  URINE MICROSCOPIC-ADD ON  POCT PREGNANCY, URINE   No results found.   1. Abdominal pain       MDM  Abdominal pain has developed in the evening, but has had nausea and vomiting all day. Will require IV fluids, antiemetics, laboratory evaluation to rule out pancreatitis, cholecystitis or peptic ulcer disease   Laboratory evaluation shows normal lipase, normal liver function tests and alkaline phosphatase, white blood cell count slightly elevated at 11.5, normal appearing urine. There is no ketones in the urine.  I have reexamined the patient and she had significant improvement with the GI cocktail from 10 out of 10 pain to 4/10 pain. Her nausea has completely resolved with Zofran and Phenergan. The only pain medication that she was given was a GI cocktail. I have talked to the patient extensively regarding her results and indications for followup and she has expressed her understanding.  On repeat abdominal exam she has minimal epigastric tenderness, no guarding, soft and non-peritoneal.  She states that her pain has improved significantly and is much less tender on exam  diCharge prescriptions  #1 Pepcid #2 Phenergan     Vida Roller, MD 04/16/11 [phone]

## 2011-04-16 NOTE — ED Notes (Signed)
Vomiting and epigastric pain all day with some diarrhea.  lmp  Dec 28th

## 2011-07-30 ENCOUNTER — Emergency Department (HOSPITAL_COMMUNITY): Payer: BC Managed Care – PPO

## 2011-07-30 ENCOUNTER — Encounter (HOSPITAL_COMMUNITY): Payer: Self-pay | Admitting: *Deleted

## 2011-07-30 ENCOUNTER — Emergency Department (HOSPITAL_COMMUNITY)
Admission: EM | Admit: 2011-07-30 | Discharge: 2011-07-31 | Disposition: A | Discharge status: Home / Self Care | Payer: BC Managed Care – PPO | Attending: Emergency Medicine | Admitting: Emergency Medicine

## 2011-07-30 DIAGNOSIS — R079 Chest pain, unspecified: Secondary | ICD-10-CM | POA: Insufficient documentation

## 2011-07-30 DIAGNOSIS — R0602 Shortness of breath: Secondary | ICD-10-CM | POA: Insufficient documentation

## 2011-07-30 DIAGNOSIS — R002 Palpitations: Secondary | ICD-10-CM

## 2011-07-30 HISTORY — DX: Essential (primary) hypertension: I10

## 2011-07-30 LAB — COMPREHENSIVE METABOLIC PANEL
ALT: 24 U/L (ref 0–35)
AST: 17 U/L (ref 0–37)
Albumin: 3.6 g/dL (ref 3.5–5.2)
Alkaline Phosphatase: 104 U/L (ref 39–117)
Calcium: 9.2 mg/dL (ref 8.4–10.5)
GFR calc Af Amer: >90 mL/min (ref >90)
Potassium: 3.7 mEq/L (ref 3.5–5.1)
Sodium: 136 mEq/L (ref 135–145)
Total Protein: 7 g/dL (ref 6.0–8.3)

## 2011-07-30 LAB — TROPONIN I: Troponin I: <0.3 ng/mL (ref <0.30)

## 2011-07-30 LAB — CBC
HCT: 37.9 % (ref 36.0–46.0)
MCHC: 35.1 g/dL (ref 30.0–36.0)
MCV: 91.5 fL (ref 78.0–100.0)
Platelets: 278 10*3/uL (ref 150–400)
RDW: 13.2 % (ref 11.5–15.5)
WBC: 10 10*3/uL (ref 4.0–10.5)

## 2011-07-30 LAB — DIFFERENTIAL
Basophils Absolute: 0 10*3/uL (ref 0.0–0.1)
Basophils Relative: 0 % (ref 0–1)
Eosinophils Relative: 1 % (ref 0–5)
Lymphocytes Relative: 24 % (ref 12–46)
Monocytes Absolute: 0.6 10*3/uL (ref 0.1–1.0)
Neutro Abs: 6.9 10*3/uL (ref 1.7–7.7)

## 2011-07-30 LAB — POCT PREGNANCY, URINE: Preg Test, Ur: NEGATIVE

## 2011-07-30 LAB — CK TOTAL AND CKMB (NOT AT ARMC): Relative Index: INVALID (ref 0.0–2.5)

## 2011-07-30 NOTE — ED Provider Notes (Signed)
History     CSN: [id number]  Arrival date & time 07/30/11  2208   First MD Initiated Contact with Patient 07/30/11 2307     Chief Complaint  Patient presents with  . Chest Pain    Patient is a 31 y.o. female presenting with chest pain. The history is provided by the patient.  Chest Pain The chest pain began 3 - 5 hours ago. Episode Length: "a few seconds" Chest pain occurs intermittently. The chest pain is improving. Associated with: nothing. The severity of the pain is moderate. The quality of the pain is described as pressure-like. The pain does not radiate. Exacerbated by: nothing. Primary symptoms include shortness of breath and palpitations. Pertinent negatives for primary symptoms include no fever, no fatigue, no syncope, no cough, no wheezing, no nausea, no vomiting and no altered mental status.  The palpitations also occurred with shortness of breath.   Pertinent negatives for associated symptoms include no diaphoresis. Treatments tried: rest.   Pt presents with chest pain She reports episodes of chest pressure and feeling "that my heart is racing" No syncope No pleuritic type pain No vomiting No fever No back pain No weakness No abdominal pain No active pain at this time  She reports recent workup with [facility] cardiology for palpitations She reports wearing monitor for several weeks and undergoing stress test  She reports stress test was negative She reports that echo shows "leaky tricuspid and mitral valve" but reports she was told otherwise normal Started on betablocker No h/o PE No h/o CAD She reports right LE swelling She reports SOB only with brief episodes of palpitations  Past Medical History  Diagnosis Date  . Hypertension     History reviewed. No pertinent past surgical history.  No family history on file.  History  Substance Use Topics  . Smoking status: Never Smoker   . Smokeless tobacco: Not on file  . Alcohol Use: No    OB History    Grav  Para Term Preterm Abortions TAB SAB Ect Mult Living                  Review of Systems  Constitutional: Negative for fever, diaphoresis and fatigue.  Respiratory: Positive for shortness of breath. Negative for cough and wheezing.   Cardiovascular: Positive for chest pain and palpitations. Negative for syncope.  Gastrointestinal: Negative for nausea and vomiting.  Psychiatric/Behavioral: Negative for altered mental status.  All other systems reviewed and are negative.    Allergies  Review of patient's allergies indicates no known allergies.  Home Medications   Current Outpatient Rx  Name Route Sig Dispense Refill  . ALPRAZOLAM 0.25 MG PO TABS Oral Take 0.25 mg by mouth at bedtime as needed. For anxiety    . AMLODIPINE BESYLATE 10 MG PO TABS Oral Take 10 mg by mouth daily.    . ATENOLOL 25 MG PO TABS Oral Take 25 mg by mouth daily.      BP 134/89  Pulse 84  Temp(Src) 98.2 F (36.8 C) (Oral)  Resp 11  SpO2 100%  LMP 07/10/2011  Physical Exam CONSTITUTIONAL: Well developed/well nourished HEAD AND FACE: Normocephalic/atraumatic EYES: EOMI/PERRL ENMT: Mucous membranes moist NECK: supple no meningeal signs SPINE:entire spine nontender CV: S1/S2 noted, no murmurs/rubs/gallops noted LUNGS: Lungs are clear to auscultation bilaterally, no apparent distress ABDOMEN: soft, nontender, no rebound or guarding GU:no cva tenderness NEURO: Pt is awake/alert, moves all extremitiesx4 EXTREMITIES: pulses normal, full ROM Right LE pitting edema >left LE.  Mild  calf tenderness.  No erythema.  Distal pulses intact SKIN: warm, color normal PSYCH: no abnormalities of mood noted  ED Course  Procedures   Labs Reviewed  CBC  DIFFERENTIAL  POCT PREGNANCY, URINE  COMPREHENSIVE METABOLIC PANEL  CK TOTAL AND CKMB  TROPONIN I  D-DIMER, QUANTITATIVE   11:40 PM Pt well appearing with recent extensive cardiac workup Suspicion for ACS is low Also, does have right LE edema, could have  DVT, d-dimer sent  ddimer negative Pt resting comfortably Troponin not used in decision making (ordered prior to my eval) Stable for d/c  The patient appears reasonably screened and/or stabilized for discharge and I doubt any other medical condition or other [facility] requiring further screening, evaluation, or treatment in the ED at this time prior to discharge.   MDM  Nursing notes reviewed and considered in documentation xrays reviewed and considered All labs/vitals reviewed and considered        Date: 07/30/2011  Rate: 86  Rhythm: normal sinus rhythm  QRS Axis: normal  Intervals: normal  ST/T Wave abnormalities: normal  Conduction Disutrbances:none    Joya Gaskins, MD 07/31/11 [phone]

## 2011-07-30 NOTE — ED Notes (Signed)
MD at bedside. 

## 2011-07-30 NOTE — ED Notes (Signed)
Chest pain with sl sob and nausea for one week.  She has had a cardiac work up with leaky valves dx.  She was on a monitor for one month just removed

## 2011-07-31 LAB — D-DIMER, QUANTITATIVE: D-Dimer, Quant: 0.29 ug/mL-FEU (ref 0.00–0.48)

## 2011-07-31 MED ORDER — OXYCODONE-ACETAMINOPHEN 5-325 MG PO TABS
1.0000 | ORAL_TABLET | Freq: Once | ORAL | Status: AC
  Administered 2011-07-31: 1 via ORAL
  Filled 2011-07-31: qty 1

## 2012-06-14 ENCOUNTER — Emergency Department (HOSPITAL_COMMUNITY)
Admission: EM | Admit: 2012-06-14 | Discharge: 2012-06-14 | Discharge status: Against Medical Advice | Payer: PRIVATE HEALTH INSURANCE

## 2012-06-14 NOTE — ED Notes (Signed)
Pt not in waiting room

## 2012-06-14 NOTE — ED Notes (Signed)
Not in waiting room

## 2012-06-14 NOTE — ED Notes (Signed)
Pt not in [facility] when called

## 2012-12-14 ENCOUNTER — Emergency Department (HOSPITAL_COMMUNITY)
Admission: EM | Admit: 2012-12-14 | Discharge: 2012-12-14 | Disposition: A | Discharge status: Home / Self Care | Payer: Worker's Compensation | Attending: Emergency Medicine | Admitting: Emergency Medicine

## 2012-12-14 ENCOUNTER — Encounter (HOSPITAL_COMMUNITY): Payer: Self-pay | Admitting: *Deleted

## 2012-12-14 ENCOUNTER — Emergency Department (HOSPITAL_COMMUNITY): Payer: Worker's Compensation

## 2012-12-14 DIAGNOSIS — S93409A Sprain of unspecified ligament of unspecified ankle, initial encounter: Secondary | ICD-10-CM | POA: Insufficient documentation

## 2012-12-14 DIAGNOSIS — S93401A Sprain of unspecified ligament of right ankle, initial encounter: Secondary | ICD-10-CM

## 2012-12-14 DIAGNOSIS — W010XXA Fall on same level from slipping, tripping and stumbling without subsequent striking against object, initial encounter: Secondary | ICD-10-CM | POA: Insufficient documentation

## 2012-12-14 DIAGNOSIS — Z79899 Other long term (current) drug therapy: Secondary | ICD-10-CM | POA: Insufficient documentation

## 2012-12-14 DIAGNOSIS — Y9389 Activity, other specified: Secondary | ICD-10-CM | POA: Insufficient documentation

## 2012-12-14 DIAGNOSIS — I10 Essential (primary) hypertension: Secondary | ICD-10-CM | POA: Insufficient documentation

## 2012-12-14 DIAGNOSIS — S7000XA Contusion of unspecified hip, initial encounter: Secondary | ICD-10-CM | POA: Insufficient documentation

## 2012-12-14 DIAGNOSIS — Y9229 Other specified public building as the place of occurrence of the external cause: Secondary | ICD-10-CM | POA: Insufficient documentation

## 2012-12-14 DIAGNOSIS — S7002XA Contusion of left hip, initial encounter: Secondary | ICD-10-CM

## 2012-12-14 DIAGNOSIS — Z8679 Personal history of other diseases of the circulatory system: Secondary | ICD-10-CM | POA: Insufficient documentation

## 2012-12-14 HISTORY — DX: Migraine, unspecified, not intractable, without status migrainosus: G43.909

## 2012-12-14 MED ORDER — IBUPROFEN 800 MG PO TABS: 800.0000 mg | ORAL_TABLET | Freq: Three times a day (TID) | ORAL | Status: DC

## 2012-12-14 MED ORDER — TRAMADOL HCL 50 MG PO TABS: 50.0000 mg | ORAL_TABLET | Freq: Four times a day (QID) | ORAL | Status: DC | PRN

## 2012-12-14 NOTE — ED Provider Notes (Signed)
CSN: [id number]     Arrival date & time 12/14/12  0756 History   First MD Initiated Contact with Patient 12/14/12 [phone]     Chief Complaint  Patient presents with  . Back Pain  . Ankle Pain   (Consider location/radiation/quality/duration/timing/severity/associated sxs/prior Treatment) HPI Comments: Patient presents with pain to her left hip and her right ankle. She states that she was at work last night and tripped on some uneven pavement blowing her right ankle. She's had some constant pain to that area. She also thinks she either fell on her twisted her left hip and has pain in her left hip. It hurts to sit on it it hurts to bear weight on it. She denies any head injury. There is no loss of consciousness. She denies any other injuries. She took some Motrin prior to arrival without improvement.  Patient is a 32 y.o. female presenting with back pain and ankle pain.  Back Pain Associated symptoms: no abdominal pain, no chest pain, no fever, no headaches, no numbness and no weakness   Ankle Pain Associated symptoms: back pain   Associated symptoms: no fatigue and no fever     Past Medical History  Diagnosis Date  . Hypertension   . Migraines    History reviewed. No pertinent past surgical history. No family history on file. History  Substance Use Topics  . Smoking status: Never Smoker   . Smokeless tobacco: Not on file  . Alcohol Use: No   OB History   Grav Para Term Preterm Abortions TAB SAB Ect Mult Living                 Review of Systems  Constitutional: Negative for fever, chills, diaphoresis and fatigue.  HENT: Negative for congestion, rhinorrhea and sneezing.   Eyes: Negative.   Respiratory: Negative for cough, chest tightness and shortness of breath.   Cardiovascular: Negative for chest pain and leg swelling.  Gastrointestinal: Negative for nausea, vomiting, abdominal pain, diarrhea and blood in stool.  Genitourinary: Negative for frequency, hematuria, flank pain and  difficulty urinating.  Musculoskeletal: Positive for back pain and arthralgias.  Skin: Negative for rash.  Neurological: Negative for dizziness, speech difficulty, weakness, numbness and headaches.    Allergies  Review of patient's allergies indicates no known allergies.  Home Medications   Current Outpatient Rx  Name  Route  Sig  Dispense  Refill  . ALPRAZolam (XANAX) 0.25 MG tablet   Oral   Take 0.25 mg by mouth at bedtime as needed. For anxiety         . amLODipine (NORVASC) 10 MG tablet   Oral   Take 10 mg by mouth daily.         Marland Kitchen atenolol (TENORMIN) 25 MG tablet   Oral   Take 25 mg by mouth daily.         . butalbital-acetaminophen-caffeine (FIORICET, ESGIC) 50-325-40 MG per tablet   Oral   Take 1 tablet by mouth 2 (two) times daily as needed for headache.         . chlorthalidone (HYGROTON) 25 MG tablet   Oral   Take 25 mg by mouth daily.         Marland Kitchen ibuprofen (ADVIL,MOTRIN) 800 MG tablet   Oral   Take 1 tablet (800 mg total) by mouth 3 (three) times daily.   21 tablet   0   . traMADol (ULTRAM) 50 MG tablet   Oral   Take 1 tablet (50 mg total)  by mouth every 6 (six) hours as needed for pain.   15 tablet   0    BP 131/85  Pulse 67  Temp(Src) 97.8 F (36.6 C) (Oral)  Resp 18  SpO2 96%  LMP 12/13/2012 Physical Exam  Constitutional: She is oriented to person, place, and time. She appears well-developed and well-nourished.  HENT:  Head: Normocephalic and atraumatic.  Eyes: Pupils are equal, round, and reactive to light.  Neck: Normal range of motion. Neck supple.  There is no pain along the cervical thoracic or lumbosacral spine  Cardiovascular: Normal rate, regular rhythm and normal heart sounds.   Pulmonary/Chest: Effort normal and breath sounds normal. No respiratory distress. She has no wheezes. She has no rales. She exhibits no tenderness.  Abdominal: Soft. Bowel sounds are normal. There is no tenderness. There is no rebound and no  guarding.  Musculoskeletal: Normal range of motion. She exhibits no edema.  Patient does have some tenderness over the left sciatic nerve and some pain on range of motion of the left hip. There is no pain to the knee or the ankle. She does have some tenderness and swelling along the lateral malleolus of the right ankle. There some mild tenderness to the proximal fifth metatarsal but no other bony tenderness to the foot. She's neurovascularly intact in the foot. There is no pain to the right hip or ankle.  Lymphadenopathy:    She has no cervical adenopathy.  Neurological: She is alert and oriented to person, place, and time.  Skin: Skin is warm and dry. No rash noted.  Psychiatric: She has a normal mood and affect.    ED Course  Procedures (including critical care time) Labs Review Labs Reviewed - No data to display Imaging Review Dg Hip Complete Left  12/14/2012   CLINICAL DATA:  Fall today. Left hip pain.  EXAM: LEFT HIP - COMPLETE 2+ VIEW  COMPARISON:  None.  FINDINGS: There is no evidence of hip fracture or dislocation. There is no evidence of arthropathy or other focal bone abnormality.  IMPRESSION: Negative.   Electronically Signed   By: Amie Portland   On: 12/14/2012 09:14   Dg Ankle Complete Right  12/14/2012   *RADIOLOGY REPORT*  Clinical Data: Pain post trauma  RIGHT ANKLE - COMPLETE 3+ VIEW  Comparison: None.  Findings: Frontal, oblique, and lateral views were obtained.  There is no fracture or effusion.  Ankle mortise appears intact.  There is a small accessory ossicle in the medial malleolar region.  IMPRESSION:  No acute fracture.  Mortise intact.   Original Report Authenticated By: Bretta Bang, M.D.      MDM   1. Contusion, hip, left, initial encounter   2. Ankle sprain, right, initial encounter    No evidence of fracture is identified. She was given an air splint to use in her ankle. She was advised nice elevation. She was given a prescription for high-dose ibuprofen  and Ultram. She was advised to followup with her primary care physician if her symptoms are not improving.    Rolan Bucco, MD 12/14/12 [phone]

## 2012-12-14 NOTE — ED Notes (Signed)
Pt states as she was leaving work last night, she tripped and fell on pavement. States she twisted right ankle, c/o right ankle pain, lower back pain, left hip pain, and pain to left ankle when pressure applied. Has not taken anything for pain

## 2012-12-14 NOTE — ED Notes (Signed)
Pt had paperwork for workers comp drug screen. Charge and tech qualified to do drug screen both reviewed paperwork and pts paperwork recommended pt go to [facility] for services. Pt refereed to [facility] for drug screen services due to drug screen would not be shipped to correct drug screen location if collected at [facility] ED as understood by charge nurse.

## 2012-12-14 NOTE — ED Notes (Signed)
Pt returned for x-ray. 

## 2012-12-17 ENCOUNTER — Encounter (HOSPITAL_COMMUNITY): Payer: Self-pay | Admitting: *Deleted

## 2012-12-17 ENCOUNTER — Emergency Department (HOSPITAL_COMMUNITY)
Admission: EM | Admit: 2012-12-17 | Discharge: 2012-12-17 | Disposition: A | Discharge status: Home / Self Care | Payer: PRIVATE HEALTH INSURANCE | Attending: Emergency Medicine | Admitting: Emergency Medicine

## 2012-12-17 DIAGNOSIS — Z8679 Personal history of other diseases of the circulatory system: Secondary | ICD-10-CM | POA: Insufficient documentation

## 2012-12-17 DIAGNOSIS — H9209 Otalgia, unspecified ear: Secondary | ICD-10-CM | POA: Insufficient documentation

## 2012-12-17 DIAGNOSIS — I10 Essential (primary) hypertension: Secondary | ICD-10-CM | POA: Insufficient documentation

## 2012-12-17 DIAGNOSIS — R0982 Postnasal drip: Secondary | ICD-10-CM | POA: Insufficient documentation

## 2012-12-17 DIAGNOSIS — Z79899 Other long term (current) drug therapy: Secondary | ICD-10-CM | POA: Insufficient documentation

## 2012-12-17 DIAGNOSIS — Z87442 Personal history of urinary calculi: Secondary | ICD-10-CM | POA: Insufficient documentation

## 2012-12-17 DIAGNOSIS — J029 Acute pharyngitis, unspecified: Secondary | ICD-10-CM | POA: Insufficient documentation

## 2012-12-17 HISTORY — DX: Disorder of kidney and ureter, unspecified: N28.9

## 2012-12-17 LAB — RAPID STREP SCREEN (MED CTR MEBANE ONLY): Streptococcus, Group A Screen (Direct): NEGATIVE

## 2012-12-17 MED ORDER — CETIRIZINE HCL 10 MG PO CAPS: 10.0000 mg | ORAL_CAPSULE | Freq: Every morning | ORAL | Status: DC

## 2012-12-17 MED ORDER — IBUPROFEN 800 MG PO TABS: 800.0000 mg | ORAL_TABLET | Freq: Three times a day (TID) | ORAL | Status: DC

## 2012-12-17 MED ORDER — IBUPROFEN 800 MG PO TABS
800.0000 mg | ORAL_TABLET | Freq: Once | ORAL | Status: AC
  Administered 2012-12-17: 800 mg via ORAL
  Filled 2012-12-17: qty 1

## 2012-12-17 MED ORDER — FLUTICASONE PROPIONATE 50 MCG/ACT NA SUSP: 2.0000 | Freq: Every day | NASAL | Status: DC

## 2012-12-17 NOTE — ED Provider Notes (Signed)
CSN: [id number]     Arrival date & time 12/17/12  0126 History   First MD Initiated Contact with Patient 12/17/12 [phone]     Chief Complaint  Patient presents with  . Sore Throat  . Nasal Congestion   (Consider location/radiation/quality/duration/timing/severity/associated sxs/prior Treatment) Patient is a 32 y.o. female presenting with pharyngitis. The history is provided by the patient.  Sore Throat This is a new problem. The current episode started more than 2 days ago. The problem occurs constantly. The problem has not changed since onset.Pertinent negatives include no chest pain, no abdominal pain, no headaches and no shortness of breath. Nothing aggravates the symptoms. Nothing relieves the symptoms. She has tried nothing for the symptoms. The treatment provided no relief.  Taking tylenol severe cold  Past Medical History  Diagnosis Date  . Hypertension   . Migraines   . Renal disorder     kidney stones   Past Surgical History  Procedure Laterality Date  . Lithotripsy     No family history on file. History  Substance Use Topics  . Smoking status: Never Smoker   . Smokeless tobacco: Not on file  . Alcohol Use: No   OB History   Grav Para Term Preterm Abortions TAB SAB Ect Mult Living                 Review of Systems  HENT: Positive for ear pain, congestion and sore throat. Negative for drooling, mouth sores, trouble swallowing, voice change and ear discharge.   Respiratory: Negative for shortness of breath.   Cardiovascular: Negative for chest pain.  Gastrointestinal: Negative for abdominal pain.  Neurological: Negative for headaches.  All other systems reviewed and are negative.    Allergies  Review of patient's allergies indicates no known allergies.  Home Medications   Current Outpatient Rx  Name  Route  Sig  Dispense  Refill  . ALPRAZolam (XANAX) 0.25 MG tablet   Oral   Take 0.25 mg by mouth at bedtime as needed. For anxiety         . amLODipine  (NORVASC) 10 MG tablet   Oral   Take 10 mg by mouth daily.         Marland Kitchen atenolol (TENORMIN) 25 MG tablet   Oral   Take 25 mg by mouth daily.         . butalbital-acetaminophen-caffeine (FIORICET, ESGIC) 50-325-40 MG per tablet   Oral   Take 1 tablet by mouth 2 (two) times daily as needed for headache.         . chlorthalidone (HYGROTON) 25 MG tablet   Oral   Take 25 mg by mouth daily.         Marland Kitchen ibuprofen (ADVIL,MOTRIN) 800 MG tablet   Oral   Take 1 tablet (800 mg total) by mouth 3 (three) times daily.   21 tablet   0   . traMADol (ULTRAM) 50 MG tablet   Oral   Take 1 tablet (50 mg total) by mouth every 6 (six) hours as needed for pain.   15 tablet   0    BP 144/107  Pulse 89  Temp(Src) 97.7 F (36.5 C) (Oral)  Resp 20  Ht 5\' 4"  (1.626 m)  SpO2 98%  LMP 12/13/2012 Physical Exam  Constitutional: She is oriented to person, place, and time. She appears well-developed and well-nourished. No distress.  HENT:  Head: Normocephalic and atraumatic.  Right Ear: No mastoid tenderness. Tympanic membrane is not injected.  Left  Ear: No mastoid tenderness. Tympanic membrane is not injected.  Mouth/Throat: No oropharyngeal exudate.  Cobblestoning with clear posterior drainage consistent with post nasal drip  Eyes: Conjunctivae are normal. Pupils are equal, round, and reactive to light.  Neck: Normal range of motion. Neck supple.  Cardiovascular: Normal rate and regular rhythm.   Pulmonary/Chest: Effort normal and breath sounds normal. No stridor. She has no wheezes. She has no rales.  Abdominal: Soft. Bowel sounds are normal. There is no tenderness. There is no rebound and no guarding.  Musculoskeletal: Normal range of motion.  Lymphadenopathy:    She has no cervical adenopathy.  Neurological: She is alert and oriented to person, place, and time.  Skin: Skin is warm and dry.  Psychiatric: She has a normal mood and affect.    ED Course  Procedures (including critical  care time) Labs Review Labs Reviewed  RAPID STREP SCREEN   Imaging Review No results found.  MDM  No diagnosis found. Will treat for post nasal drip   Trent Gabler K Renalda Locklin-Rasch, MD 12/17/12 [phone]

## 2012-12-17 NOTE — ED Notes (Signed)
Pt states that she began to have sore throat and congestion on Wed; pt states that it has progressively gotten worse; pt states that she know has a productive cough with white sputum and it is sore when she coughs; pt reports nasal drainage.

## 2012-12-18 LAB — CULTURE, GROUP A STREP

## 2012-12-30 ENCOUNTER — Emergency Department (HOSPITAL_COMMUNITY)
Admission: EM | Admit: 2012-12-30 | Discharge: 2012-12-30 | Disposition: A | Discharge status: Home / Self Care | Payer: PRIVATE HEALTH INSURANCE | Attending: Emergency Medicine | Admitting: Emergency Medicine

## 2012-12-30 ENCOUNTER — Emergency Department (HOSPITAL_COMMUNITY): Payer: PRIVATE HEALTH INSURANCE

## 2012-12-30 ENCOUNTER — Encounter (HOSPITAL_COMMUNITY): Payer: Self-pay | Admitting: *Deleted

## 2012-12-30 DIAGNOSIS — N201 Calculus of ureter: Secondary | ICD-10-CM

## 2012-12-30 DIAGNOSIS — Z87442 Personal history of urinary calculi: Secondary | ICD-10-CM | POA: Insufficient documentation

## 2012-12-30 DIAGNOSIS — I10 Essential (primary) hypertension: Secondary | ICD-10-CM | POA: Insufficient documentation

## 2012-12-30 DIAGNOSIS — R110 Nausea: Secondary | ICD-10-CM | POA: Insufficient documentation

## 2012-12-30 DIAGNOSIS — Z3202 Encounter for pregnancy test, result negative: Secondary | ICD-10-CM | POA: Insufficient documentation

## 2012-12-30 DIAGNOSIS — Z79899 Other long term (current) drug therapy: Secondary | ICD-10-CM | POA: Insufficient documentation

## 2012-12-30 LAB — URINALYSIS, ROUTINE W REFLEX MICROSCOPIC
Bilirubin Urine: NEGATIVE
Glucose, UA: NEGATIVE mg/dL
Nitrite: NEGATIVE
Specific Gravity, Urine: 1.037 — ABNORMAL HIGH (ref 1.005–1.030)
pH: 6.5 (ref 5.0–8.0)

## 2012-12-30 LAB — URINE MICROSCOPIC-ADD ON

## 2012-12-30 MED ORDER — ONDANSETRON HCL 4 MG/2ML IJ SOLN
4.0000 mg | Freq: Once | INTRAMUSCULAR | Status: AC
  Administered 2012-12-30: 4 mg via INTRAVENOUS
  Filled 2012-12-30: qty 2

## 2012-12-30 MED ORDER — HYDROMORPHONE HCL PF 1 MG/ML IJ SOLN
1.0000 mg | Freq: Once | INTRAMUSCULAR | Status: AC
  Administered 2012-12-30: 1 mg via INTRAVENOUS
  Filled 2012-12-30: qty 1

## 2012-12-30 MED ORDER — OXYCODONE-ACETAMINOPHEN 5-325 MG PO TABS: 2.0000 | ORAL_TABLET | ORAL | Status: DC | PRN

## 2012-12-30 MED ORDER — ONDANSETRON HCL 4 MG PO TABS: 4.0000 mg | ORAL_TABLET | Freq: Four times a day (QID) | ORAL | Status: DC

## 2012-12-30 NOTE — ED Notes (Signed)
Pt was asleep and woke up with right flank pain,  And nausea, history of kidneystones.

## 2012-12-30 NOTE — ED Notes (Signed)
Waiting on Urine Pregnancy results to proceed.

## 2012-12-30 NOTE — ED Notes (Signed)
Pt has had d/c teaching and vitals, waiting on her sister to pick her up, ETA 20 minutes

## 2012-12-30 NOTE — ED Provider Notes (Signed)
CSN: [id number]     Arrival date & time 12/30/12  0410 History   First MD Initiated Contact with Patient 12/30/12 0507     Chief Complaint  Patient presents with  . Flank Pain  . Nausea   (Consider location/radiation/quality/duration/timing/severity/associated sxs/prior Treatment) HPI History provided by patient. History of kidney stones. Around 4 AM developed sudden onset sharp right flank pain, not radiating, has associated nausea but no vomiting. Feels like previous kidney stone. No fevers or chills. No aggravating or alleviating factors.  Pain is moderate to severe.  Past Medical History  Diagnosis Date  . Hypertension   . Migraines   . Renal disorder     kidney stones   Past Surgical History  Procedure Laterality Date  . Lithotripsy     History reviewed. No pertinent family history. History  Substance Use Topics  . Smoking status: Never Smoker   . Smokeless tobacco: Not on file  . Alcohol Use: No   OB History   Grav Para Term Preterm Abortions TAB SAB Ect Mult Living                 Review of Systems  Constitutional: Negative for fever and chills.  HENT: Negative for neck pain and neck stiffness.   Eyes: Negative for pain.  Respiratory: Negative for shortness of breath.   Cardiovascular: Negative for chest pain.  Gastrointestinal: Negative for abdominal pain.  Genitourinary: Positive for flank pain. Negative for dysuria and hematuria.  Musculoskeletal: Negative for back pain.  Skin: Negative for rash.  Neurological: Negative for headaches.  All other systems reviewed and are negative.    Allergies  Review of patient's allergies indicates no known allergies.  Home Medications   Current Outpatient Rx  Name  Route  Sig  Dispense  Refill  . ALPRAZolam (XANAX) 0.25 MG tablet   Oral   Take 0.25 mg by mouth at bedtime as needed for anxiety. For anxiety         . amLODipine (NORVASC) 10 MG tablet   Oral   Take 10 mg by mouth every evening.          Marland Kitchen  atenolol (TENORMIN) 25 MG tablet   Oral   Take 25 mg by mouth every evening.          . butalbital-acetaminophen-caffeine (FIORICET, ESGIC) 50-325-40 MG per tablet   Oral   Take 1 tablet by mouth 2 (two) times daily as needed for headache.         . cetirizine (ZYRTEC) 10 MG tablet   Oral   Take 10 mg by mouth daily as needed for allergies.         . chlorthalidone (HYGROTON) 25 MG tablet   Oral   Take 25 mg by mouth every evening.          . fluticasone (FLONASE) 50 MCG/ACT nasal spray   Nasal   Place 2 sprays into the nose daily.   16 g   0   . ibuprofen (ADVIL,MOTRIN) 800 MG tablet   Oral   Take 1 tablet (800 mg total) by mouth 3 (three) times daily.   21 tablet   0   . traMADol (ULTRAM) 50 MG tablet   Oral   Take 1 tablet (50 mg total) by mouth every 6 (six) hours as needed for pain.   15 tablet   0    BP 132/97  Pulse 99  Temp(Src) 98.5 F (36.9 C) (Oral)  Resp 18  SpO2  100%  LMP 12/13/2012 Physical Exam  Constitutional: She is oriented to person, place, and time. She appears well-developed and well-nourished.  HENT:  Head: Normocephalic and atraumatic.  Eyes: EOM are normal. Pupils are equal, round, and reactive to light.  Neck: Neck supple.  Cardiovascular: Regular rhythm and intact distal pulses.   Pulmonary/Chest: Effort normal and breath sounds normal. No respiratory distress.  Abdominal: Soft. Bowel sounds are normal. She exhibits no distension. There is no tenderness. There is no rebound and no guarding.  R flank tenderness, negative Murphy's sign. No right upper quadrant tenderness  Musculoskeletal: Normal range of motion. She exhibits no edema.  Neurological: She is alert and oriented to person, place, and time.  Skin: Skin is warm and dry.    ED Course  Procedures (including critical care time) Labs Review Labs Reviewed  URINALYSIS, ROUTINE W REFLEX MICROSCOPIC - Abnormal; Notable for the following:    Color, Urine AMBER (*)     APPearance CLOUDY (*)    Specific Gravity, Urine 1.037 (*)    Hgb urine dipstick LARGE (*)    Protein, ur 100 (*)    All other components within normal limits  URINE MICROSCOPIC-ADD ON - Abnormal; Notable for the following:    Squamous Epithelial / LPF MANY (*)    Bacteria, UA FEW (*)    All other components within normal limits  URINE CULTURE  PREGNANCY, URINE   Imaging Review Ct Abdomen Pelvis Wo Contrast  12/30/2012   *RADIOLOGY REPORT*  Clinical Data: Right flank pain  CT ABDOMEN AND PELVIS WITHOUT CONTRAST  Technique:  Multidetector CT imaging of the abdomen and pelvis was performed following the standard protocol without intravenous contrast.  Comparison: CT abdomen/pelvis 07/09/2010  Findings:  Lower Chest:  Mild dependent atelectasis in the lower lobes.  The lungs are otherwise clear.  Borderline cardiomegaly.  No pericardial effusion.  The visualized distal thoracic esophagus is unremarkable.  Abdomen: Unenhanced CT was performed per clinician order.  Lack of IV contrast limits sensitivity and specificity, especially for evaluation of abdominal/pelvic solid viscera.  Within these limitations, unremarkable CT appearance of the stomach, duodenum, spleen, adrenal glands and pancreas.  Normal hepatic contour and morphology.  No hepatic lesions.  The gallbladder is contracted and the lumen packed with radiopaque stones.  No gallbladder wall thickening or pericholecystic fluid.  No intra or extrahepatic biliary ductal dilatation.  Nonobstructing 5 x 4 mm stone in the right ureteropelvic junction. No significant hydronephrosis.  There may be trace fullness of the renal pelvis.  No renal edema or perinephric stranding.  No additional nephrolithiasis identified.  Normal-caliber large and small bowel throughout the abdomen.  No significant colonic diverticular disease.  Normal appendix in the right lower quadrant.  Terminal ileum is unremarkable.  No free fluid or suspicious adenopathy.  Pelvis: The  bladder is decompressed.  Unremarkable uterus and left adnexa.  Follicular cyst associated with the right ovary.  No free fluid or suspicious adenopathy.  Bones: No acute fracture or aggressive appearing lytic or blastic osseous lesion.  Vascular: Limited evaluation in the absence of intravenous contrast material.  No discrete vascular abnormality identified.  IMPRESSION:  1.  Nonobstructing 5 x 4 mm right UPJ stone. 2.  Borderline cardiomegaly. 3.  Cholelithiasis. 4.  Otherwise, unremarkable noncontrasted CT scan of the abdomen and pelvis.   Original Report Authenticated By: Malachy Moan, M.D.   IV fluids. IV Dilaudid. IV Zofran On recheck, pain improved. Plan discharge home with kidney stone precautions verbalized as  understood. Urology referral provided. Prescriptions provided. All questions answered.  MDM  Diagnosis right ureterolithiasis CT scan reviewed with patient as above Improved with medications including IV narcotics Vital signs and nursing notes reviewed and considered     Sara Nielsen, MD 12/31/12 [phone]

## 2012-12-30 NOTE — ED Notes (Signed)
Pt states her sister will be here "in a few minutes" Letting pt lay in bed until family member is seen

## 2013-01-01 ENCOUNTER — Encounter (HOSPITAL_COMMUNITY): Payer: Self-pay | Admitting: Emergency Medicine

## 2013-01-01 ENCOUNTER — Observation Stay (HOSPITAL_COMMUNITY)
Admission: EM | Admit: 2013-01-01 | Discharge: 2013-01-02 | Disposition: A | Discharge status: Home / Self Care | Payer: PRIVATE HEALTH INSURANCE | Attending: Urology | Admitting: Urology

## 2013-01-01 DIAGNOSIS — N201 Calculus of ureter: Principal | ICD-10-CM | POA: Insufficient documentation

## 2013-01-01 DIAGNOSIS — R110 Nausea: Secondary | ICD-10-CM | POA: Insufficient documentation

## 2013-01-01 DIAGNOSIS — E876 Hypokalemia: Secondary | ICD-10-CM | POA: Insufficient documentation

## 2013-01-01 DIAGNOSIS — I10 Essential (primary) hypertension: Secondary | ICD-10-CM | POA: Insufficient documentation

## 2013-01-01 DIAGNOSIS — K8020 Calculus of gallbladder without cholecystitis without obstruction: Secondary | ICD-10-CM | POA: Insufficient documentation

## 2013-01-01 DIAGNOSIS — R5381 Other malaise: Secondary | ICD-10-CM | POA: Insufficient documentation

## 2013-01-01 DIAGNOSIS — N200 Calculus of kidney: Secondary | ICD-10-CM

## 2013-01-01 LAB — URINE CULTURE

## 2013-01-01 NOTE — ED Notes (Signed)
Pt stated she was seen here for kidney stones on Sunday, followed up with the urologist today.  Pt c/o continued pain and inability to urinate, and vomiting. Pt reports pain unrelieved d/t vomiting medications.

## 2013-01-02 ENCOUNTER — Other Ambulatory Visit: Payer: Self-pay | Admitting: Urology

## 2013-01-02 ENCOUNTER — Encounter (HOSPITAL_COMMUNITY): Payer: Self-pay | Admitting: *Deleted

## 2013-01-02 LAB — URINE MICROSCOPIC-ADD ON

## 2013-01-02 LAB — POCT I-STAT, CHEM 8
BUN: 17 mg/dL (ref 6–23)
Calcium, Ion: 1.14 mmol/L (ref 1.12–1.23)
Chloride: 106 mEq/L (ref 96–112)
Glucose, Bld: 91 mg/dL (ref 70–99)
HCT: 34 % — ABNORMAL LOW (ref 36.0–46.0)
Hemoglobin: 11.6 g/dL — ABNORMAL LOW (ref 12.0–15.0)

## 2013-01-02 LAB — URINALYSIS, ROUTINE W REFLEX MICROSCOPIC
Nitrite: NEGATIVE
Protein, ur: 100 mg/dL — AB
Specific Gravity, Urine: 1.029 (ref 1.005–1.030)
Urobilinogen, UA: 0.2 mg/dL (ref 0.0–1.0)

## 2013-01-02 MED ORDER — PROMETHAZINE HCL 25 MG/ML IJ SOLN
25.0000 mg | Freq: Once | INTRAMUSCULAR | Status: AC
  Administered 2013-01-02: 25 mg via INTRAVENOUS
  Filled 2013-01-02: qty 1

## 2013-01-02 MED ORDER — ONDANSETRON HCL 4 MG/2ML IJ SOLN: 4.0000 mg | INTRAMUSCULAR | Status: DC | PRN

## 2013-01-02 MED ORDER — ONDANSETRON HCL 4 MG/2ML IJ SOLN
4.0000 mg | Freq: Once | INTRAMUSCULAR | Status: AC
  Administered 2013-01-02: 4 mg via INTRAVENOUS

## 2013-01-02 MED ORDER — MORPHINE SULFATE 2 MG/ML IJ SOLN: 2.0000 mg | INTRAMUSCULAR | Status: DC | PRN

## 2013-01-02 MED ORDER — METOCLOPRAMIDE HCL 5 MG/ML IJ SOLN
10.0000 mg | Freq: Once | INTRAMUSCULAR | Status: AC
  Administered 2013-01-02: 10 mg via INTRAVENOUS
  Filled 2013-01-02: qty 2

## 2013-01-02 MED ORDER — HYDROMORPHONE HCL PF 1 MG/ML IJ SOLN
INTRAMUSCULAR | Status: AC
  Filled 2013-01-02: qty 1

## 2013-01-02 MED ORDER — KETOROLAC TROMETHAMINE 30 MG/ML IJ SOLN
30.0000 mg | Freq: Once | INTRAMUSCULAR | Status: AC
  Administered 2013-01-02: 30 mg via INTRAVENOUS

## 2013-01-02 MED ORDER — KETOROLAC TROMETHAMINE 30 MG/ML IJ SOLN
INTRAMUSCULAR | Status: AC
  Filled 2013-01-02: qty 1

## 2013-01-02 MED ORDER — OXYCODONE HCL 5 MG PO TABS: 5.0000 mg | ORAL_TABLET | ORAL | Status: DC | PRN

## 2013-01-02 MED ORDER — HYDROMORPHONE HCL PF 1 MG/ML IJ SOLN
1.0000 mg | Freq: Once | INTRAMUSCULAR | Status: AC
  Administered 2013-01-02: 1 mg via INTRAVENOUS

## 2013-01-02 MED ORDER — ONDANSETRON HCL 4 MG/2ML IJ SOLN
INTRAMUSCULAR | Status: AC
  Filled 2013-01-02: qty 2

## 2013-01-02 MED ORDER — SODIUM CHLORIDE 0.9 % IV SOLN
INTRAVENOUS | Status: DC
  Administered 2013-01-02: 02:00:00 via INTRAVENOUS

## 2013-01-02 MED ORDER — KCL IN DEXTROSE-NACL 20-5-0.45 MEQ/L-%-% IV SOLN
INTRAVENOUS | Status: DC
  Administered 2013-01-02: 07:00:00 via INTRAVENOUS
  Filled 2013-01-02: qty 1000

## 2013-01-02 MED ORDER — SODIUM CHLORIDE 0.9 % IV BOLUS (SEPSIS)
1000.0000 mL | Freq: Once | INTRAVENOUS | Status: AC
  Administered 2013-01-02: 1000 mL via INTRAVENOUS

## 2013-01-02 MED ORDER — POTASSIUM CHLORIDE 10 MEQ/100ML IV SOLN
10.0000 meq | Freq: Once | INTRAVENOUS | Status: AC
  Administered 2013-01-02: 10 meq via INTRAVENOUS
  Filled 2013-01-02: qty 100

## 2013-01-02 NOTE — Progress Notes (Signed)
Patient aware to take atenolol and norvasc this pm as usually does.  Patient voices understanding.

## 2013-01-02 NOTE — Progress Notes (Signed)
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  Requested most recent office visit note , ekg, stress and echo.  Patient reports last visit was approximately a year ago.

## 2013-01-02 NOTE — Anesthesia Preprocedure Evaluation (Addendum)
Anesthesia Evaluation  Patient identified by MRN, date of birth, ID band Patient awake    Reviewed: Allergy & Precautions, H&P , NPO status , Patient's Chart, lab work & pertinent test results  Airway Mallampati: II TM Distance: >3 FB Neck ROM: Full    Dental no notable dental hx.    Pulmonary neg pulmonary ROS,  breath sounds clear to auscultation  Pulmonary exam normal       Cardiovascular hypertension, Pt. on medications and Pt. on home beta blockers negative cardio ROS  Rhythm:Regular Rate:Normal     Neuro/Psych  Headaches, negative psych ROS   GI/Hepatic negative GI ROS, Neg liver ROS,   Endo/Other  Morbid obesity  Renal/GU Renal disease  negative genitourinary   Musculoskeletal negative musculoskeletal ROS (+)   Abdominal   Peds  Hematology negative hematology ROS (+)   Anesthesia Other Findings   Reproductive/Obstetrics                          Anesthesia Physical Anesthesia Plan  ASA: II  Anesthesia Plan: General   Post-op Pain Management:    Induction: Intravenous  Airway Management Planned: LMA  Additional Equipment:   Intra-op Plan:   Post-operative Plan: Extubation in OR  Informed Consent: I have reviewed the patients History and Physical, chart, labs and discussed the procedure including the risks, benefits and alternatives for the proposed anesthesia with the patient or authorized representative who has indicated his/her understanding and acceptance.   Dental advisory given  Plan Discussed with: CRNA  Anesthesia Plan Comments:         Anesthesia Quick Evaluation

## 2013-01-02 NOTE — ED Provider Notes (Signed)
CSN: [id number]     Arrival date & time 01/01/13  2342 History   First MD Initiated Contact with Patient 01/02/13 [phone]     Chief Complaint  Patient presents with  . Flank Pain    right   (Consider location/radiation/quality/duration/timing/severity/associated sxs/prior Treatment) HPI History provided by pt and prior chart.  Per prior chart, pt diagnosed w/ 5mm R ureteral stone by CT on 12/30/12.  She followed up with urology yesterday and has another appt scheduled with them for tomorrow for [place].  Returns to ER now because her pain and nausea have not been controlled w/ percocet and zofran.  Vomits immediately after taking them. Vomited "all day yesterday" and has severe pain in flank that radiates to right side of abdomen.  Associated w/ hematuria.  Denies fever, diarrhea, vaginal sx.  Past Medical History  Diagnosis Date  . Hypertension   . Migraines   . Renal disorder     kidney stones   Past Surgical History  Procedure Laterality Date  . Lithotripsy     History reviewed. No pertinent family history. History  Substance Use Topics  . Smoking status: Never Smoker   . Smokeless tobacco: Not on file  . Alcohol Use: No   OB History   Grav Para Term Preterm Abortions TAB SAB Ect Mult Living                 Review of Systems  All other systems reviewed and are negative.    Allergies  Review of patient's allergies indicates no known allergies.  Home Medications   Current Outpatient Rx  Name  Route  Sig  Dispense  Refill  . ALPRAZolam (XANAX) 0.25 MG tablet   Oral   Take 0.25 mg by mouth at bedtime as needed for anxiety. For anxiety         . amLODipine (NORVASC) 10 MG tablet   Oral   Take 10 mg by mouth every evening.          Marland Kitchen atenolol (TENORMIN) 25 MG tablet   Oral   Take 25 mg by mouth every evening.          . butalbital-acetaminophen-caffeine (FIORICET, ESGIC) 50-325-40 MG per tablet   Oral   Take 1 tablet by mouth 2 (two) times daily as needed for  headache.         . cetirizine (ZYRTEC) 10 MG tablet   Oral   Take 10 mg by mouth daily as needed for allergies.         . chlorthalidone (HYGROTON) 25 MG tablet   Oral   Take 25 mg by mouth every evening.          . fluticasone (FLONASE) 50 MCG/ACT nasal spray   Nasal   Place 2 sprays into the nose daily as needed for rhinitis.         Marland Kitchen ibuprofen (ADVIL,MOTRIN) 800 MG tablet   Oral   Take 800 mg by mouth every 8 (eight) hours as needed for pain.         Marland Kitchen ondansetron (ZOFRAN) 4 MG tablet   Oral   Take 4 mg by mouth every 8 (eight) hours as needed for nausea.         Marland Kitchen oxyCODONE-acetaminophen (PERCOCET/ROXICET) 5-325 MG per tablet   Oral   Take 1 tablet by mouth every 4 (four) hours as needed for pain.         . traMADol (ULTRAM) 50 MG tablet   Oral  Take 50 mg by mouth every 6 (six) hours as needed for pain.          BP 145/75  Pulse 78  Temp(Src) 98.6 F (37 C) (Oral)  Resp 16  Ht 5\' 4"  (1.626 m)  Wt 230 lb (104.327 kg)  BMI 39.46 kg/m2  SpO2 97%  LMP 12/13/2012 Physical Exam  Nursing note and vitals reviewed. Constitutional: She is oriented to person, place, and time. She appears well-developed and well-nourished. No distress.  HENT:  Head: Normocephalic and atraumatic.  Eyes:  Normal appearance  Neck: Normal range of motion.  Cardiovascular: Normal rate and regular rhythm.   Pulmonary/Chest: Effort normal and breath sounds normal. No respiratory distress.  Abdominal: Soft. Bowel sounds are normal. She exhibits no distension and no mass. There is no rebound and no guarding.  Mild, diffuse right-sided abd ttp.  obese  Musculoskeletal: Normal range of motion.  Diffuse, mild right low back ttp, including CVA.  Neurological: She is alert and oriented to person, place, and time.  Skin: Skin is warm and dry. No rash noted.  Psychiatric: She has a normal mood and affect. Her behavior is normal.    ED Course  Procedures (including critical  care time) Labs Review Labs Reviewed  URINALYSIS, ROUTINE W REFLEX MICROSCOPIC - Abnormal; Notable for the following:    Color, Urine RED (*)    APPearance TURBID (*)    Hgb urine dipstick LARGE (*)    Bilirubin Urine MODERATE (*)    Ketones, ur 15 (*)    Protein, ur 100 (*)    Leukocytes, UA SMALL (*)    All other components within normal limits  URINE MICROSCOPIC-ADD ON - Abnormal; Notable for the following:    Bacteria, UA FEW (*)    All other components within normal limits  URINE CULTURE   Imaging Review No results found.  MDM   1. Kidney stone    31yo F diagnosed w/ R ureteral stone 2 days ago and returns to ER this morning w/ persistent R flank pain and vomiting.  Unable to tolerate pos, including her meds.  On exam today, afebrile, NAD, R CVA ttp, abd soft/non-distended and mild tenderness right side.  Labs sig for elevated Cr and hypokalemia.  K+ to be replaced.  Pain successfully treated w/ dilaudid but pt continues to be nauseous following IV zofran and phenergan.  Reglan ordered.   Urology consulted and will admit.      Otilio Miu, PA-C 01/02/13 [phone]

## 2013-01-02 NOTE — ED Provider Notes (Signed)
Medical screening examination/treatment/procedure(s) were conducted as a shared visit with non-physician practitioner(s) and myself.  I personally evaluated the patient during the encounter  Persistent R flank pain and N/V unable to to hold anything down, recent DX Kidney stone, has seen urology in the office, returns here symptomatic. No F/C. On exam TTP R flank. Dx kidney stone. Urology consulted and admited PT.   Results for orders placed during the hospital encounter of 01/01/13  URINALYSIS, ROUTINE W REFLEX MICROSCOPIC      Result Value Range   Color, Urine RED (*) YELLOW   APPearance TURBID (*) CLEAR   Specific Gravity, Urine 1.029  1.005 - 1.030   pH 6.0  5.0 - 8.0   Glucose, UA NEGATIVE  NEGATIVE mg/dL   Hgb urine dipstick LARGE (*) NEGATIVE   Bilirubin Urine MODERATE (*) NEGATIVE   Ketones, ur 15 (*) NEGATIVE mg/dL   Protein, ur 725 (*) NEGATIVE mg/dL   Urobilinogen, UA 0.2  0.0 - 1.0 mg/dL   Nitrite NEGATIVE  NEGATIVE   Leukocytes, UA SMALL (*) NEGATIVE  URINE MICROSCOPIC-ADD ON      Result Value Range   WBC, UA 7-10  <3 WBC/hpf   RBC / HPF TOO NUMEROUS TO COUNT  <3 RBC/hpf   Bacteria, UA FEW (*) RARE   Urine-Other MUCOUS PRESENT    POCT I-STAT, CHEM 8      Result Value Range   Sodium 142  135 - 145 mEq/L   Potassium 3.1 (*) 3.5 - 5.1 mEq/L   Chloride 106  96 - 112 mEq/L   BUN 17  6 - 23 mg/dL   Creatinine, Ser 3.66 (*) 0.50 - 1.10 mg/dL   Glucose, Bld 91  70 - 99 mg/dL   Calcium, Ion 4.40  3.47 - 1.23 mmol/L   TCO2 22  0 - 100 mmol/L   Hemoglobin 11.6 (*) 12.0 - 15.0 g/dL   HCT 42.5 (*) 95.6 - 38.7 %   Ct Abdomen Pelvis Wo Contrast  12/30/2012   *RADIOLOGY REPORT*  Clinical Data: Right flank pain  CT ABDOMEN AND PELVIS WITHOUT CONTRAST  Technique:  Multidetector CT imaging of the abdomen and pelvis was performed following the standard protocol without intravenous contrast.  Comparison: CT abdomen/pelvis 07/09/2010  Findings:  Lower Chest:  Mild dependent atelectasis  in the lower lobes.  The lungs are otherwise clear.  Borderline cardiomegaly.  No pericardial effusion.  The visualized distal thoracic esophagus is unremarkable.  Abdomen: Unenhanced CT was performed per clinician order.  Lack of IV contrast limits sensitivity and specificity, especially for evaluation of abdominal/pelvic solid viscera.  Within these limitations, unremarkable CT appearance of the stomach, duodenum, spleen, adrenal glands and pancreas.  Normal hepatic contour and morphology.  No hepatic lesions.  The gallbladder is contracted and the lumen packed with radiopaque stones.  No gallbladder wall thickening or pericholecystic fluid.  No intra or extrahepatic biliary ductal dilatation.  Nonobstructing 5 x 4 mm stone in the right ureteropelvic junction. No significant hydronephrosis.  There may be trace fullness of the renal pelvis.  No renal edema or perinephric stranding.  No additional nephrolithiasis identified.  Normal-caliber large and small bowel throughout the abdomen.  No significant colonic diverticular disease.  Normal appendix in the right lower quadrant.  Terminal ileum is unremarkable.  No free fluid or suspicious adenopathy.  Pelvis: The bladder is decompressed.  Unremarkable uterus and left adnexa.  Follicular cyst associated with the right ovary.  No free fluid or suspicious adenopathy.  Bones: No acute fracture or aggressive appearing lytic or blastic osseous lesion.  Vascular: Limited evaluation in the absence of intravenous contrast material.  No discrete vascular abnormality identified.  IMPRESSION:  1.  Nonobstructing 5 x 4 mm right UPJ stone. 2.  Borderline cardiomegaly. 3.  Cholelithiasis. 4.  Otherwise, unremarkable noncontrasted CT scan of the abdomen and pelvis.   Original Report Authenticated By: Malachy Moan, M.D.     Sunnie Nielsen, MD 01/02/13 [phone]

## 2013-01-02 NOTE — Discharge Summary (Signed)
Date of admission: 01/01/2013  Date of discharge: 01/02/2013  Admission diagnosis: Renal colic  Discharge diagnosis: Renal colic  Secondary diagnoses: none  History and Physical: For full details, please see admission history and physical. Briefly, Sara Ramirez is a 32 y.o. year old patient with rt. Renal colic.  Admitted for pain control.  Felt good the next day   Hospital Course: pain settled. Lab OK  Laboratory values:  Recent Labs  01/02/13 0408  HGB 11.6*  HCT 34.0*    Recent Labs  01/02/13 0408  CREATININE 1.30*    Disposition: Home  Discharge instruction: The patient was instructed to be ambulatory but told to refrain from heavy lifting, strenuous activity, or driving. Discusssed  Discharge medications:    Medication List         ALPRAZolam 0.25 MG tablet  Commonly known as:  XANAX  Take 0.25 mg by mouth at bedtime as needed for anxiety. For anxiety     amLODipine 10 MG tablet  Commonly known as:  NORVASC  Take 10 mg by mouth every evening.     atenolol 25 MG tablet  Commonly known as:  TENORMIN  Take 25 mg by mouth every evening.     butalbital-acetaminophen-caffeine 50-325-40 MG per tablet  Commonly known as:  FIORICET, ESGIC  Take 1 tablet by mouth 2 (two) times daily as needed for headache.     cetirizine 10 MG tablet  Commonly known as:  ZYRTEC  Take 10 mg by mouth daily as needed for allergies.     chlorthalidone 25 MG tablet  Commonly known as:  HYGROTON  Take 25 mg by mouth every evening.     fluticasone 50 MCG/ACT nasal spray  Commonly known as:  FLONASE  Place 2 sprays into the nose daily as needed for rhinitis.     ibuprofen 800 MG tablet  Commonly known as:  ADVIL,MOTRIN  Take 800 mg by mouth every 8 (eight) hours as needed for pain.     ondansetron 4 MG tablet  Commonly known as:  ZOFRAN  Take 4 mg by mouth every 8 (eight) hours as needed for nausea.     oxyCODONE-acetaminophen 5-325 MG per tablet  Commonly known as:   PERCOCET/ROXICET  Take 1 tablet by mouth every 4 (four) hours as needed for pain.     traMADol 50 MG tablet  Commonly known as:  ULTRAM  Take 50 mg by mouth every 6 (six) hours as needed for pain.        Followup:      Follow-up Information   Follow up with Jadalynn Burr A, MD. (tomorrow)    Specialty:  Urology   Contact information:   [address] [facility] [place] [address] [phone]

## 2013-01-02 NOTE — Progress Notes (Signed)
Feels better this am Spoke re all tx options and drew picture  Pt decided for monotherapy ESWL tomorrow at 730 am DC home and i will arrange

## 2013-01-02 NOTE — H&P (Signed)
Sara Ramirez is an 32 y.o. female.   Chief Complaint: Severe ongoing right-sided flank pain secondary to proximal ureteral calculus HPI: 32 year old female with prior history of nephrolithiasis. Patient was recently diagnosed with 45 mm proximal right ureteral stone. She was seen in our office yesterday and was noted to have a 4-5 mm stone overlying the 12th rib on the right side. She was scheduled to see Dr. Ihor Gully  this week to consider definitive management. She developed increased severity of pain earlier this morning. She's been in the emergency department for 5-6 hours and then had a great deal of difficulty getting her pain under control. She is currently reasonably comfortable. She has had ureteroscopy and stent placement in the past. She requests intervention if at all possible.  Past Medical History  Diagnosis Date  . Hypertension   . Migraines   . Renal disorder     kidney stones    Past Surgical History  Procedure Laterality Date  . Lithotripsy      History reviewed. No pertinent family history. Social History:  reports that she has never smoked. She does not have any smokeless tobacco history on file. She reports that she does not drink alcohol or use illicit drugs.  Allergies: No Known Allergies   (Not in a hospital admission)  Results for orders placed during the hospital encounter of 01/01/13 (from the past 48 hour(s))  URINALYSIS, ROUTINE W REFLEX MICROSCOPIC     Status: Abnormal   Collection Time    01/02/13  2:55 AM      Result Value Range   Color, Urine RED (*) YELLOW   Comment: BIOCHEMICALS MAY BE AFFECTED BY COLOR   APPearance TURBID (*) CLEAR   Specific Gravity, Urine 1.029  1.005 - 1.030   pH 6.0  5.0 - 8.0   Glucose, UA NEGATIVE  NEGATIVE mg/dL   Hgb urine dipstick LARGE (*) NEGATIVE   Bilirubin Urine MODERATE (*) NEGATIVE   Ketones, ur 15 (*) NEGATIVE mg/dL   Protein, ur 161 (*) NEGATIVE mg/dL   Urobilinogen, UA 0.2  0.0 - 1.0 mg/dL    Nitrite NEGATIVE  NEGATIVE   Leukocytes, UA SMALL (*) NEGATIVE  URINE MICROSCOPIC-ADD ON     Status: Abnormal   Collection Time    01/02/13  2:55 AM      Result Value Range   WBC, UA 7-10  <3 WBC/hpf   RBC / HPF TOO NUMEROUS TO COUNT  <3 RBC/hpf   Bacteria, UA FEW (*) RARE   Urine-Other MUCOUS PRESENT    POCT I-STAT, CHEM 8     Status: Abnormal   Collection Time    01/02/13  4:08 AM      Result Value Range   Sodium 142  135 - 145 mEq/L   Potassium 3.1 (*) 3.5 - 5.1 mEq/L   Chloride 106  96 - 112 mEq/L   BUN 17  6 - 23 mg/dL   Creatinine, Ser 0.96 (*) 0.50 - 1.10 mg/dL   Glucose, Bld 91  70 - 99 mg/dL   Calcium, Ion 0.45  4.09 - 1.23 mmol/L   TCO2 22  0 - 100 mmol/L   Hemoglobin 11.6 (*) 12.0 - 15.0 g/dL   HCT 81.1 (*) 91.4 - 78.2 %   No results found.  Review of Systems - Negative except severe abdominal and flank discomfort, nausea, fatigue  Blood pressure 145/75, pulse 78, temperature 98.6 F (37 C), temperature source Oral, resp. rate 16, height 5\' 4"  (  1.626 m), weight 104.327 kg (230 lb), last menstrual period 12/13/2012, SpO2 97.00%. General appearance: alert, cooperative appears mildly uncomfortable. Neck: no adenopathy and no JVD Resp: clear to auscultation bilaterally Cardio: regular rate and rhythm GI: soft, non-tender; bowel sounds normal; no masses,  no organomegaly Extremities: extremities normal, atraumatic, no cyanosis or edema Skin: Skin color, texture, turgor normal. No rashes or lesions Neurologic: Grossly normal  Assessment/Plan 4-5 mm right proximal ureteral stone. Initially she was diagnosed with UPJ stone which is probably progressed. She's requesting more urgent intervention and has had 5 or 6 hours of constant discomfort. She is clinically improved at this point. We will admit her for pain control observation. Her primary urologist and/or the physician on-call make the ultimate decision as to whether she receives intervention today or  not.  [name] S 01/02/2013, 6:16 AM

## 2013-01-02 NOTE — H&P (Signed)
History of Present Illness   Sara Ramirez went to the emergency room Sunday with right flank pain, nausea, and vomiting. She may have had a little bit of a fever Sunday night but history has been fine since. She had flank pain yesterday that was milder and she took 2 pain medicines. She had a little bit of nausea from her pain medicine. On September 28 she had a CT scan which showed a nonobstructing 5 mm x 4 mm stone at the right ureteropelvic junction. There may have been a trace of fullness in the right renal pelvis. A urinalysis was negative and a urine culture is pending. There is no other modifying factors or associated signs or symptoms. There is no other aggravating or relieving factors. The symptoms are milder in severity but recurring.   Urinalysis: I reviewed, positive bacteria and calcium crystals in the urine. Urine sent for culture.  Review of Systems: No change in bowel or neurologic systems.   Sara Ramirez has had a stent and right ureteroscopy in 2009. She has had some stent discomfort afterwards. In 2009, I could not definitively identify her surgeon though it was placed under my name. She had removal of a right stent and ureteroscopy with stone extraction. In April 2012, she saw Dr. Ottelin and had a distal left ureteral stone.   On physical examination she was nontoxic. One could argue she had a little bit of right CVA tenderness.    Surgical History Problems  1. History of  Cystoscopy With Insertion Of Ureteral Stent Right 2. History of  Cystoscopy With Ureteroscopy With Lithotripsy  Current Meds 1. AmLODIPine Besylate TABS; Therapy: (Recorded:12Apr2012) to 2. Atenolol TABS; Therapy: (Recorded:30Sep2014) to 3. Chlorthalidone 25 MG Oral Tablet; Therapy: (Recorded:30Sep2014) to 4. Fioricet TABS; Therapy: (Recorded:12Apr2012) to 5. Imitrex TABS; Therapy: (Recorded:30Sep2014) to 6. Xanax TABS; Therapy: (Recorded:30Sep2014) to 7. Zofran 8 MG Oral Tablet; Therapy:  (Recorded:12Apr2012) to  Allergies Medication  1. Pertussis Vaccine  Family History Problems  1. Family history of  No Significant Family History  Social History Problems  1. Caffeine Use 4 cups a day 2. Marital History - Currently Married 3. Never A Smoker 4. Occupation: [employer] Denied  5. History of  Alcohol Use  Vitals Vital Signs [Data Includes: Last 1 Day]  30Sep2014 08:45AM  Blood Pressure: 155 / 95 Temperature: 98.9 F Heart Rate: 77  Results/Data Urine [Data Includes: Last 1 Day]   30Sep2014  COLOR YELLOW   APPEARANCE CLEAR   SPECIFIC GRAVITY 1.030   pH 6.0   GLUCOSE NEG mg/dL  BILIRUBIN NEG   KETONE TRACE mg/dL  BLOOD LARGE   PROTEIN NEG mg/dL  UROBILINOGEN 0.2 mg/dL  NITRITE NEG   LEUKOCYTE ESTERASE NEG   SQUAMOUS EPITHELIAL/HPF FEW   WBC 3-6 WBC/hpf  RBC 21-50 RBC/hpf  BACTERIA FEW   CRYSTALS Calcium Oxalate crystals noted   CASTS NONE SEEN   Other MUCUS NOTED    Assessment Assessed  1. Urinary Calculus On The Right 592.9  Plan Health Maintenance (V70.0)  1. UA With REFLEX  Done: 30Sep2014 08:30AM  Discussion/Summary   Sara Ramirez has right flank pain that is intermittent. I ordered a KUB.   I reviewed Sara Ramirez's KUB. There was no bony abnormalities. Kidney shadows were noted. I did not see any calcification in the area of the right kidney, right ureteropelvic junction, or course of right ureter. A picture was drawn.   Based upon intermittent symptoms and her urinalysis today, I would like to have   Sara Ramirez follow up with a renal ultrasound.   Sara Ramirez was given 40 Percocet. She was given 20 Phenergan. She was given a strainer and Rapaflo samples. I am going to have her see Dr. Ottelin on Thursday with a renal ultrasound. We will proceed accordingly. Clinically, I think she still has the stone and she may need ureteroscopy in the future.  After a thorough review of the management options for the patient's  condition the patient  elected to proceed with surgical therapy as noted above. We have discussed the potential benefits and risks of the procedure, side effects of the proposed treatment, the likelihood of the patient achieving the goals of the procedure, and any potential problems that might occur during the procedure or recuperation. Informed consent has been obtained. 

## 2013-01-02 NOTE — Progress Notes (Signed)
Pt. D/c after being on the unit for less than 2 hours.  Went over d/c instructions with patient.  Patient to f/u in urology office tomorrow am at 0700 per patient.  Patient requested doctor's note.  Called urology office who stated that patient can come by after d/c to pick up note per RN at office.  Patient informed.

## 2013-01-02 NOTE — Care Management Note (Addendum)
    Page 1 of 1   01/02/2013     12:27:38 PM   CARE MANAGEMENT NOTE 01/02/2013  Patient:  Sara Ramirez, Sara Ramirez   Account Number:  [id number]  Date Initiated:  01/02/2013  Documentation initiated by:  [facility]  Subjective/Objective Assessment:   31 Y/O F ADMITTED W/KIDNEY STONES.     Action/Plan:   FROM HOME.   Anticipated DC Date:  01/02/2013   Anticipated DC Plan:  HOME/SELF CARE      DC Planning Services  CM consult      Choice offered to / List presented to:             Status of service:  Completed, signed off Medicare Important Message given?   (If response is "NO", the following Medicare IM given date fields will be blank) Date Medicare IM given:   Date Additional Medicare IM given:    Discharge Disposition:  HOME/SELF CARE  Per UR Regulation:  Reviewed for med. necessity/level of care/duration of stay  If discussed at Long Length of Stay Meetings, dates discussed:    Comments:

## 2013-01-03 ENCOUNTER — Encounter (HOSPITAL_COMMUNITY)
Admission: RE | Disposition: A | Discharge status: Home / Self Care | Payer: Self-pay | Source: Ambulatory Visit | Attending: Urology

## 2013-01-03 ENCOUNTER — Encounter (HOSPITAL_COMMUNITY): Payer: Self-pay | Admitting: Anesthesiology

## 2013-01-03 ENCOUNTER — Encounter (HOSPITAL_COMMUNITY): Payer: Self-pay | Admitting: *Deleted

## 2013-01-03 ENCOUNTER — Ambulatory Visit (HOSPITAL_COMMUNITY): Payer: PRIVATE HEALTH INSURANCE | Admitting: Anesthesiology

## 2013-01-03 ENCOUNTER — Ambulatory Visit (HOSPITAL_COMMUNITY)
Admission: RE | Admit: 2013-01-03 | Discharge: 2013-01-03 | Disposition: A | Discharge status: Home / Self Care | Payer: PRIVATE HEALTH INSURANCE | Source: Ambulatory Visit | Attending: Urology | Admitting: Urology

## 2013-01-03 DIAGNOSIS — I080 Rheumatic disorders of both mitral and aortic valves: Secondary | ICD-10-CM | POA: Insufficient documentation

## 2013-01-03 DIAGNOSIS — I10 Essential (primary) hypertension: Secondary | ICD-10-CM | POA: Insufficient documentation

## 2013-01-03 DIAGNOSIS — N201 Calculus of ureter: Secondary | ICD-10-CM | POA: Insufficient documentation

## 2013-01-03 HISTORY — DX: Rheumatic tricuspid insufficiency: I07.1

## 2013-01-03 HISTORY — PX: CYSTOSCOPY W/ URETERAL STENT PLACEMENT: SHX1429

## 2013-01-03 HISTORY — DX: Nonrheumatic mitral (valve) insufficiency: I34.0

## 2013-01-03 LAB — BASIC METABOLIC PANEL
CO2: 28 mEq/L (ref 19–32)
Chloride: 103 mEq/L (ref 96–112)
Creatinine, Ser: 0.87 mg/dL (ref 0.50–1.10)
GFR calc Af Amer: >90 mL/min (ref >90)
GFR calc non Af Amer: 89 mL/min — ABNORMAL LOW (ref >90)
Glucose, Bld: 93 mg/dL (ref 70–99)
Potassium: 3.9 mEq/L (ref 3.5–5.1)
Sodium: 138 mEq/L (ref 135–145)

## 2013-01-03 LAB — URINE CULTURE

## 2013-01-03 LAB — CBC
HCT: 33.9 % — ABNORMAL LOW (ref 36.0–46.0)
Hemoglobin: 11.6 g/dL — ABNORMAL LOW (ref 12.0–15.0)
MCH: 32.2 pg (ref 26.0–34.0)
MCHC: 34.2 g/dL (ref 30.0–36.0)
WBC: 6.5 10*3/uL (ref 4.0–10.5)

## 2013-01-03 SURGERY — CYSTOSCOPY, WITH RETROGRADE PYELOGRAM AND URETERAL STENT INSERTION
Anesthesia: General | Laterality: Right | Wound class: Clean Contaminated

## 2013-01-03 MED ORDER — CIPROFLOXACIN IN D5W 400 MG/200ML IV SOLN
INTRAVENOUS | Status: AC
  Filled 2013-01-03: qty 200

## 2013-01-03 MED ORDER — MEPERIDINE HCL 50 MG/ML IJ SOLN: 6.2500 mg | INTRAMUSCULAR | Status: DC | PRN

## 2013-01-03 MED ORDER — FENTANYL CITRATE 0.05 MG/ML IJ SOLN: 25.0000 ug | INTRAMUSCULAR | Status: DC | PRN

## 2013-01-03 MED ORDER — SODIUM CHLORIDE 0.9 % IR SOLN
Status: DC | PRN
  Administered 2013-01-03: 3000 mL via INTRAVESICAL

## 2013-01-03 MED ORDER — LACTATED RINGERS IV SOLN: INTRAVENOUS | Status: DC

## 2013-01-03 MED ORDER — PROMETHAZINE HCL 25 MG/ML IJ SOLN: 6.2500 mg | INTRAMUSCULAR | Status: DC | PRN

## 2013-01-03 MED ORDER — ATENOLOL 25 MG PO TABS
25.0000 mg | ORAL_TABLET | Freq: Once | ORAL | Status: AC
  Administered 2013-01-03: 25 mg via ORAL
  Filled 2013-01-03 (×2): qty 1

## 2013-01-03 MED ORDER — PROPOFOL 10 MG/ML IV BOLUS
INTRAVENOUS | Status: DC | PRN
  Administered 2013-01-03: 150 mg via INTRAVENOUS

## 2013-01-03 MED ORDER — DEXAMETHASONE SODIUM PHOSPHATE 10 MG/ML IJ SOLN
INTRAMUSCULAR | Status: DC | PRN
  Administered 2013-01-03: 10 mg via INTRAVENOUS

## 2013-01-03 MED ORDER — MIDAZOLAM HCL 5 MG/5ML IJ SOLN
INTRAMUSCULAR | Status: DC | PRN
  Administered 2013-01-03: 2 mg via INTRAVENOUS

## 2013-01-03 MED ORDER — PHENAZOPYRIDINE HCL 200 MG PO TABS
200.0000 mg | ORAL_TABLET | Freq: Once | ORAL | Status: AC
  Administered 2013-01-03: 200 mg via ORAL
  Filled 2013-01-03: qty 1

## 2013-01-03 MED ORDER — OXYCODONE-ACETAMINOPHEN 5-325 MG PO TABS
1.0000 | ORAL_TABLET | Freq: Once | ORAL | Status: AC
  Administered 2013-01-03: 1 via ORAL
  Filled 2013-01-03: qty 1

## 2013-01-03 MED ORDER — LACTATED RINGERS IV SOLN
INTRAVENOUS | Status: DC | PRN
  Administered 2013-01-03: 07:00:00 via INTRAVENOUS

## 2013-01-03 MED ORDER — LIDOCAINE HCL 2 % EX GEL
CUTANEOUS | Status: AC
  Filled 2013-01-03: qty 10

## 2013-01-03 MED ORDER — CIPROFLOXACIN IN D5W 400 MG/200ML IV SOLN
400.0000 mg | INTRAVENOUS | Status: AC
  Administered 2013-01-03: 400 mg via INTRAVENOUS

## 2013-01-03 MED ORDER — ONDANSETRON HCL 4 MG/2ML IJ SOLN
INTRAMUSCULAR | Status: DC | PRN
  Administered 2013-01-03: 4 mg via INTRAVENOUS

## 2013-01-03 MED ORDER — FENTANYL CITRATE 0.05 MG/ML IJ SOLN
INTRAMUSCULAR | Status: DC | PRN
  Administered 2013-01-03 (×2): 50 ug via INTRAVENOUS

## 2013-01-03 SURGICAL SUPPLY — 16 items
BAG URO CATCHER STRL LF (DRAPE) ×2 IMPLANT
BASKET ZERO TIP NITINOL 2.4FR (BASKET) IMPLANT
BSKT STON RTRVL ZERO TP 2.4FR (BASKET)
CATH URET 5FR 28IN OPEN ENDED (CATHETERS) ×2 IMPLANT
CLOTH BEACON ORANGE TIMEOUT ST (SAFETY) ×2 IMPLANT
DRAPE CAMERA CLOSED 9X96 (DRAPES) ×2 IMPLANT
GLOVE BIOGEL M STRL SZ7.5 (GLOVE) ×2 IMPLANT
GOWN PREVENTION PLUS XLARGE (GOWN DISPOSABLE) ×2 IMPLANT
GOWN STRL REIN XL XLG (GOWN DISPOSABLE) ×2 IMPLANT
GUIDEWIRE ANG ZIPWIRE 038X150 (WIRE) IMPLANT
GUIDEWIRE STR DUAL SENSOR (WIRE) ×1 IMPLANT
MARKER SKIN DUAL TIP RULER LAB (MISCELLANEOUS) ×1 IMPLANT
PACK CYSTO (CUSTOM PROCEDURE TRAY) ×2 IMPLANT
STENT PERCUFLEX 4.8FRX24 (STENTS) ×2 IMPLANT
SYR 20CC LL (SYRINGE) ×2 IMPLANT
WIRE COONS/BENSON .038X145CM (WIRE) ×1 IMPLANT

## 2013-01-03 NOTE — H&P (Signed)
History of Present Illness   Sara Ramirez went to the emergency room Sunday with right flank pain, nausea, and vomiting. She may have had a little bit of a fever Sunday night but history has been fine since. She had flank pain yesterday that was milder and she took 2 pain medicines. She had a little bit of nausea from her pain medicine. On September 28 she had a CT scan which showed a nonobstructing 5 mm x 4 mm stone at the right ureteropelvic junction. There may have been a trace of fullness in the right renal pelvis. A urinalysis was negative and a urine culture is pending. There is no other modifying factors or associated signs or symptoms. There is no other aggravating or relieving factors. The symptoms are milder in severity but recurring.   Urinalysis: I reviewed, positive bacteria and calcium crystals in the urine. Urine sent for culture.  Review of Systems: No change in bowel or neurologic systems.   Sara Ramirez has had a stent and right ureteroscopy in 2009. She has had some stent discomfort afterwards. In 2009, I could not definitively identify her surgeon though it was placed under my name. She had removal of a right stent and ureteroscopy with stone extraction. In April 2012, she saw Dr. Ottelin and had a distal left ureteral stone.   On physical examination she was nontoxic. One could argue she had a little bit of right CVA tenderness.    Surgical History Problems  1. History of  Cystoscopy With Insertion Of Ureteral Stent Right 2. History of  Cystoscopy With Ureteroscopy With Lithotripsy  Current Meds 1. AmLODIPine Besylate TABS; Therapy: (Recorded:12Apr2012) to 2. Atenolol TABS; Therapy: (Recorded:30Sep2014) to 3. Chlorthalidone 25 MG Oral Tablet; Therapy: (Recorded:30Sep2014) to 4. Fioricet TABS; Therapy: (Recorded:12Apr2012) to 5. Imitrex TABS; Therapy: (Recorded:30Sep2014) to 6. Xanax TABS; Therapy: (Recorded:30Sep2014) to 7. Zofran 8 MG Oral Tablet; Therapy:  (Recorded:12Apr2012) to  Allergies Medication  1. Pertussis Vaccine  Family History Problems  1. Family history of  No Significant Family History  Social History Problems  1. Caffeine Use 4 cups a day 2. Marital History - Currently Married 3. 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 History of  Alcohol Use  Vitals Vital Signs [Data Includes: Last 1 Day]  30Sep2014 08:45AM  Blood Pressure: 155 / 95 Temperature: 98.9 F Heart Rate: 77  Results/Data Urine [Data Includes: Last 1 Day]   30Sep2014  COLOR YELLOW   APPEARANCE CLEAR   SPECIFIC GRAVITY 1.030   pH 6.0   GLUCOSE NEG mg/dL  BILIRUBIN NEG   KETONE TRACE mg/dL  BLOOD LARGE   PROTEIN NEG mg/dL  UROBILINOGEN 0.2 mg/dL  NITRITE NEG   LEUKOCYTE ESTERASE NEG   SQUAMOUS EPITHELIAL/HPF FEW   WBC 3-6 WBC/hpf  RBC 21-50 RBC/hpf  BACTERIA FEW   CRYSTALS Calcium Oxalate crystals noted   CASTS NONE SEEN   Other MUCUS NOTED    Assessment Assessed  1. Urinary Calculus On The Right 592.9  Plan Health Maintenance (V70.0)  1. UA With REFLEX  Done: 30Sep2014 08:30AM  Discussion/Summary   Sara Ramirez has right flank pain that is intermittent. I ordered a KUB.   I reviewed Sara Ramirez's KUB. There was no bony abnormalities. Kidney shadows were noted. I did not see any calcification in the area of the right kidney, right ureteropelvic junction, or course of right ureter. A picture was drawn.   Based upon intermittent symptoms and her urinalysis today, I would like to have   Sara Ramirez follow up with a renal ultrasound.   Sara Ramirez was given 40 Percocet. She was given 20 Phenergan. She was given a strainer and Rapaflo samples. I am going to have her see Dr. Ottelin on Thursday with a renal ultrasound. We will proceed accordingly. Clinically, I think she still has the stone and she may need ureteroscopy in the future.  After a thorough review of the management options for the patient's  condition the patient  elected to proceed with surgical therapy as noted above. We have discussed the potential benefits and risks of the procedure, side effects of the proposed treatment, the likelihood of the patient achieving the goals of the procedure, and any potential problems that might occur during the procedure or recuperation. Informed consent has been obtained. 

## 2013-01-03 NOTE — Transfer of Care (Signed)
Immediate Anesthesia Transfer of Care Note  Patient: Sara Ramirez  Procedure(s) Performed: Procedure(s): CYSTOSCOPY WITH RIGHT RETROGRADE PYELOGRAM/ RIGHT URETERAL STENT PLACEMENT (Right)  Patient Location: PACU  Anesthesia Type:General  Level of Consciousness: awake, alert , oriented and patient cooperative  Airway & Oxygen Therapy: Patient Spontanous Breathing and Patient connected to face mask oxygen  Post-op Assessment: Report given to PACU RN and Post -op Vital signs reviewed and stable  Post vital signs: Reviewed and stable  Complications: No apparent anesthesia complications

## 2013-01-03 NOTE — Interval H&P Note (Signed)
History and Physical Interval Note:  01/03/2013 7:10 AM  Sara Ramirez  has presented today for surgery, with the diagnosis of RIGHT URETERAL STONE   The various methods of treatment have been discussed with the patient and family. After consideration of risks, benefits and other options for treatment, the patient has consented to  Procedure(s): CYSTOSCOPY WITH RIGHT RETROGRADE PYELOGRAM/ RIGHT URETERAL STENT PLACEMENT (Right) as a surgical intervention .  The patient's history has been reviewed, patient examined, no change in status, stable for surgery.  I have reviewed the patient's chart and labs.  Questions were answered to the patient's satisfaction.     Rosangela Fehrenbach A

## 2013-01-03 NOTE — Op Note (Signed)
Preoperative diagnosis: Right ureteral stone Postoperative diagnosis: Right ureteral stone Surgery: Cystoscopy right retrograde ureterogram and insertion of right ureteral stent Surgeon: Dr. Lorin Picket Missie Gehrig  The patient has the above diagnoses and consented the above procedure. Her stones at the right ureteropelvic junction.  Preoperative antibiotics were given. Leg position was excellent.  56 [place] scope was utilized. Bladder mucosa and trigone were normal. Is no stitch or foreign body or carcinoma. Is no edema run the right ureteral orifice.  Under fluoroscopic guidance I passed a sensor wire to the mid right ureter. I did not see an obvious stone fluoroscopically. Over-the-wire I placed an opening ureteral catheter 6 French removed the wire. I did a gentle retrograde ureterogram  I injected approximately 5 cc of contrast. She mild hydro-nephrosis of the renal pelvis and calyces. I did not see an obvious stone. Bony landmarks were normal.  Under fluoroscopic guidance I passed a sensor wire curling in the upper pole calyx.  The patient was noted be 5 feet 4 inches in height. I asked for 4.8 x 24 cm stent.  Under fluoroscopic and cystoscopic guidance I passed the open-ended stent without the string curling in the right renal pelvis and in the bladder. There was a volcano affect noted. There is no injury to ureter or bladder. Bladder was emptied and patient was taken to recovery  The stent was in the lower aspect of the right renal pelvis fluoroscopically. Fluoroscopically and also based upon cystoscopic findings the stent was bridging the stone.  The patient scheduled for lithotripsy on Monday

## 2013-01-03 NOTE — Preoperative (Signed)
Beta Blockers   Reason not to administer Beta Blockers:Not Applicable 

## 2013-01-04 ENCOUNTER — Encounter (HOSPITAL_COMMUNITY): Payer: Self-pay | Admitting: Urology

## 2013-01-04 NOTE — H&P (Signed)
History of Present Illness   Sara Ramirez went to the emergency room Sunday with right flank pain, nausea, and vomiting. She may have had a little bit of a fever Sunday night but history has been fine since. She had flank pain yesterday that was milder and she took 2 pain medicines. She had a little bit of nausea from her pain medicine. On September 28 she had a CT scan which showed a nonobstructing 5 mm x 4 mm stone at the right ureteropelvic junction. There may have been a trace of fullness in the right renal pelvis. A urinalysis was negative and a urine culture is pending. There is no other modifying factors or associated signs or symptoms. There is no other aggravating or relieving factors. The symptoms are milder in severity but recurring.   Urinalysis: I reviewed, positive bacteria and calcium crystals in the urine. Urine sent for culture.  Review of Systems: No change in bowel or neurologic systems.   Sara Ramirez has had a stent and right ureteroscopy in 2009. She has had some stent discomfort afterwards. In 2009, I could not definitively identify her surgeon though it was placed under my name. She had removal of a right stent and ureteroscopy with stone extraction. In April 2012, she saw Dr. Vernie Ammons and had a distal left ureteral stone.   On physical examination she was nontoxic. One could argue she had a little bit of right CVA tenderness.    Surgical History Problems  1. History of  Cystoscopy With Insertion Of Ureteral Stent Right 2. History of  Cystoscopy With Ureteroscopy With Lithotripsy  Current Meds 1. AmLODIPine Besylate TABS; Therapy: (Recorded:12Apr2012) to 2. Atenolol TABS; Therapy: (Recorded:30Sep2014) to 3. Chlorthalidone 25 MG Oral Tablet; Therapy: (Recorded:30Sep2014) to 4. Fioricet TABS; Therapy: (Recorded:12Apr2012) to 5. Imitrex TABS; Therapy: (Recorded:30Sep2014) to 6. Xanax TABS; Therapy: (Recorded:30Sep2014) to 7. Zofran 8 MG Oral Tablet; Therapy:  (Recorded:12Apr2012) to  Allergies Medication  1. Pertussis Vaccine  Family History Problems  1. Family history of  No Significant Family History  Social History Problems  1. Caffeine Use 4 cups a day 2. Marital History - Currently Married 3. Never A Smoker 4. Occupation: [employer]  5. History of  Alcohol Use  Vitals Vital Signs [Data Includes: Last 1 Day]  30Sep2014 08:45AM  Blood Pressure: 155 / 95 Temperature: 98.9 F Heart Rate: 77  Results/Data Urine [Data Includes: Last 1 Day]   30Sep2014  COLOR YELLOW   APPEARANCE CLEAR   SPECIFIC GRAVITY 1.030   pH 6.0   GLUCOSE NEG mg/dL  BILIRUBIN NEG   KETONE TRACE mg/dL  BLOOD LARGE   PROTEIN NEG mg/dL  UROBILINOGEN 0.2 mg/dL  NITRITE NEG   LEUKOCYTE ESTERASE NEG   SQUAMOUS EPITHELIAL/HPF FEW   WBC 3-6 WBC/hpf  RBC 21-50 RBC/hpf  BACTERIA FEW   CRYSTALS Calcium Oxalate crystals noted   CASTS NONE SEEN   Other MUCUS NOTED    Assessment Assessed  1. Urinary Calculus On The Right 592.9  Plan Health Maintenance (V70.0)  1. UA With REFLEX  Done: 30Sep2014 08:30AM  Discussion/Summary   Sara Ramirez has right flank pain that is intermittent. I ordered a KUB.   I reviewed Sara Ramirez's KUB. There was no bony abnormalities. Kidney shadows were noted. I did not see any calcification in the area of the right kidney, right ureteropelvic junction, or course of right ureter. A picture was drawn.   Based upon intermittent symptoms and her urinalysis today, I would like to have  Sara Ramirez follow up with a renal ultrasound.   Sara Ramirez was given 40 Percocet. She was given 20 Phenergan. She was given a strainer and Rapaflo samples. I am going to have her see Dr. Vernie Ammons on Thursday with a renal ultrasound. We will proceed accordingly. Clinically, I think she still has the stone and she may need ureteroscopy in the future.  After a thorough review of the management options for the patient's  condition the patient  elected to proceed with surgical therapy as noted above. We have discussed the potential benefits and risks of the procedure, side effects of the proposed treatment, the likelihood of the patient achieving the goals of the procedure, and any potential problems that might occur during the procedure or recuperation. Informed consent has been obtained.

## 2013-01-04 NOTE — Anesthesia Postprocedure Evaluation (Signed)
  Anesthesia Post-op Note  Patient: Sara Ramirez  Procedure(s) Performed: Procedure(s) (LRB): CYSTOSCOPY WITH RIGHT RETROGRADE PYELOGRAM/ RIGHT URETERAL STENT PLACEMENT (Right)  Patient Location: PACU  Anesthesia Type: General  Level of Consciousness: awake and alert   Airway and Oxygen Therapy: Patient Spontanous Breathing  Post-op Pain: mild  Post-op Assessment: Post-op Vital signs reviewed, Patient's Cardiovascular Status Stable, Respiratory Function Stable, Patent Airway and No signs of Nausea or vomiting  Last Vitals:  Filed Vitals:   01/03/13 1131  BP: 128/78  Pulse: 67  Temp: 36.6 C  Resp: 18    Post-op Vital Signs: stable   Complications: No apparent anesthesia complications \

## 2013-01-07 ENCOUNTER — Ambulatory Visit (HOSPITAL_COMMUNITY): Payer: PRIVATE HEALTH INSURANCE

## 2013-01-07 ENCOUNTER — Ambulatory Visit (HOSPITAL_COMMUNITY)
Admission: RE | Admit: 2013-01-07 | Discharge: 2013-01-07 | Disposition: A | Discharge status: Home / Self Care | Payer: PRIVATE HEALTH INSURANCE | Source: Ambulatory Visit | Attending: Urology | Admitting: Urology

## 2013-01-07 ENCOUNTER — Encounter (HOSPITAL_COMMUNITY)
Admission: RE | Disposition: A | Discharge status: Home / Self Care | Payer: Self-pay | Source: Ambulatory Visit | Attending: Urology

## 2013-01-07 DIAGNOSIS — Z79899 Other long term (current) drug therapy: Secondary | ICD-10-CM | POA: Insufficient documentation

## 2013-01-07 DIAGNOSIS — N201 Calculus of ureter: Secondary | ICD-10-CM | POA: Insufficient documentation

## 2013-01-07 DIAGNOSIS — I10 Essential (primary) hypertension: Secondary | ICD-10-CM | POA: Insufficient documentation

## 2013-01-07 LAB — PREGNANCY, URINE: Preg Test, Ur: NEGATIVE

## 2013-01-07 SURGERY — LITHOTRIPSY, ESWL
Anesthesia: LOCAL | Laterality: Right

## 2013-01-07 MED ORDER — CIPROFLOXACIN HCL 500 MG PO TABS
500.0000 mg | ORAL_TABLET | ORAL | Status: AC
  Administered 2013-01-07: 500 mg via ORAL
  Filled 2013-01-07: qty 1

## 2013-01-07 MED ORDER — DIPHENHYDRAMINE HCL 25 MG PO CAPS
25.0000 mg | ORAL_CAPSULE | ORAL | Status: AC
  Administered 2013-01-07: 25 mg via ORAL
  Filled 2013-01-07: qty 1

## 2013-01-07 MED ORDER — DEXTROSE-NACL 5-0.45 % IV SOLN
INTRAVENOUS | Status: DC
  Administered 2013-01-07: 16:00:00 via INTRAVENOUS

## 2013-01-07 MED ORDER — DIAZEPAM 5 MG PO TABS
10.0000 mg | ORAL_TABLET | ORAL | Status: AC
  Administered 2013-01-07: 10 mg via ORAL
  Filled 2013-01-07: qty 2

## 2013-01-07 NOTE — Interval H&P Note (Signed)
History and Physical Interval Note:  01/07/2013 4:40 PM  Sara Ramirez  has presented today for surgery, with the diagnosis of RIGHT URETER STONE   The various methods of treatment have been discussed with the patient and family. After consideration of risks, benefits and other options for treatment, the patient has consented to  Procedure(s): RIGHT EXTRACORPOREAL SHOCK WAVE LITHOTRIPSY (ESWL) (Right) as a surgical intervention .  The patient's history has been reviewed, patient examined, no change in status, stable for surgery.  I have reviewed the patient's chart and labs.  Questions were answered to the patient's satisfaction.     [name] A

## 2013-01-07 NOTE — Progress Notes (Signed)
The patient was here for lithotripsy today. I reviewed the KUB and could not see the stone. It was compared to her last KUB and CT scan. I also reviewed with Dr. Vernie Ammons. Extensive fluoroscopy for several minutes was utilized over the kidney as well as over the 12th rib on the right side and along the mid and entire length of the ureter.  I could not see a stone. I operated CT scan first in the morning but the patient is with her family. We will call her first in the morning and organize his CT urogram before removing the stent. [name] agreed with the treatment plan

## 2013-01-07 NOTE — Interval H&P Note (Signed)
History and Physical Interval Note:  01/07/2013 4:42 PM  Sara Ramirez  has presented today for surgery, with the diagnosis of RIGHT URETER STONE   The various methods of treatment have been discussed with the patient and family. After consideration of risks, benefits and other options for treatment, the patient has consented to  Procedure(s): RIGHT EXTRACORPOREAL SHOCK WAVE LITHOTRIPSY (ESWL) (Right) as a surgical intervention .  The patient's history has been reviewed, patient examined, no change in status, stable for surgery.  I have reviewed the patient's chart and labs.  Questions were answered to the patient's satisfaction.     [name] A

## 2013-01-07 NOTE — H&P (View-Only) (Signed)
The patient was here for lithotripsy today. I reviewed the KUB and could not see the stone. It was compared to her last KUB and CT scan. I also reviewed with Dr. Ottelin. Extensive fluoroscopy for several minutes was utilized over the kidney as well as over the 12th rib on the right side and along the mid and entire length of the ureter.  I could not see a stone. I operated CT scan first in the morning but the patient is with her family. We will call her first in the morning and organize his CT urogram before removing the stent. Sara Ramirez agreed with the treatment plan 

## 2013-01-07 NOTE — Interval H&P Note (Signed)
History and Physical Interval Note:  01/07/2013 4:42 PM  Sara Ramirez  has presented today for surgery, with the diagnosis of RIGHT URETER STONE   The various methods of treatment have been discussed with the patient and family. After consideration of risks, benefits and other options for treatment, the patient has consented to  Procedure(s): RIGHT EXTRACORPOREAL SHOCK WAVE LITHOTRIPSY (ESWL) (Right) as a surgical intervention .  The patient's history has been reviewed, patient examined, no change in status, stable for surgery.  I have reviewed the patient's chart and labs.  Questions were answered to the patient's satisfaction.     Hephzibah Strehle A   

## 2013-01-11 ENCOUNTER — Encounter (HOSPITAL_COMMUNITY): Payer: Self-pay | Admitting: Emergency Medicine

## 2013-01-11 ENCOUNTER — Emergency Department (HOSPITAL_COMMUNITY)
Admission: EM | Admit: 2013-01-11 | Discharge: 2013-01-11 | Disposition: A | Discharge status: Home / Self Care | Payer: PRIVATE HEALTH INSURANCE | Attending: Emergency Medicine | Admitting: Emergency Medicine

## 2013-01-11 DIAGNOSIS — I10 Essential (primary) hypertension: Secondary | ICD-10-CM | POA: Insufficient documentation

## 2013-01-11 DIAGNOSIS — Z79899 Other long term (current) drug therapy: Secondary | ICD-10-CM | POA: Insufficient documentation

## 2013-01-11 DIAGNOSIS — Z87442 Personal history of urinary calculi: Secondary | ICD-10-CM | POA: Insufficient documentation

## 2013-01-11 DIAGNOSIS — R109 Unspecified abdominal pain: Secondary | ICD-10-CM

## 2013-01-11 HISTORY — DX: Calculus of kidney: N20.0

## 2013-01-11 LAB — URINALYSIS, ROUTINE W REFLEX MICROSCOPIC
Glucose, UA: NEGATIVE mg/dL
Ketones, ur: NEGATIVE mg/dL
Nitrite: POSITIVE — AB
Protein, ur: 100 mg/dL — AB
Urobilinogen, UA: 1 mg/dL (ref 0.0–1.0)

## 2013-01-11 LAB — URINE MICROSCOPIC-ADD ON

## 2013-01-11 MED ORDER — ONDANSETRON 4 MG PO TBDP
4.0000 mg | ORAL_TABLET | Freq: Once | ORAL | Status: AC
Start: 2013-01-11 — End: 2013-01-11
  Administered 2013-01-11: 4 mg via ORAL
  Filled 2013-01-11: qty 1

## 2013-01-11 MED ORDER — OXYCODONE-ACETAMINOPHEN 5-325 MG PO TABS
2.0000 | ORAL_TABLET | Freq: Once | ORAL | Status: AC
  Administered 2013-01-11: 2 via ORAL
  Filled 2013-01-11: qty 2

## 2013-01-11 NOTE — ED Provider Notes (Addendum)
CSN: [id number]     Arrival date & time 01/11/13  4098 History   First MD Initiated Contact with Patient 01/11/13 0335     Chief Complaint  Patient presents with  . Flank Pain   (Consider location/radiation/quality/duration/timing/severity/associated sxs/prior Treatment) HPI Comments: Patient reports, that she had a kidney scan, diagnosed in this emergency room September 30.  She's been followed by urology since, then.  She had a stent placed on Thursday.  That is due to be taken out later today.  She has had some discomfort with this but worsening over the last 24 hours to the point where she "can stand it."  She has not taken anything for pain.  Because she was at work. She states she spoke with the office, who called in an anti-spasmatic, that she placed under her tongue, which he, states was ineffective.  Denies any dysuria, blood in her urine, fever.  Nausea or vomiting  Patient is a 32 y.o. female presenting with flank pain. The history is provided by the patient.  Flank Pain This is a new problem. The current episode started yesterday. The problem occurs constantly. The problem has been gradually worsening. Associated symptoms include abdominal pain. Pertinent negatives include no chills, fever, sore throat or urinary symptoms. Nothing aggravates the symptoms. She has tried nothing for the symptoms. The treatment provided no relief.    Past Medical History  Diagnosis Date  . Hypertension   . Migraines   . Renal disorder     kidney stones  . Mitral regurgitation   . Tricuspid regurgitation   . Kidney stone    Past Surgical History  Procedure Laterality Date  . Lithotripsy    . Cystoscopy w/ ureteral stent placement Right 01/03/2013    Procedure: CYSTOSCOPY WITH RIGHT RETROGRADE PYELOGRAM/ RIGHT URETERAL STENT PLACEMENT;  Surgeon: Martina Sinner, MD;  Location: [facility];  Service: Urology;  Laterality: Right;   No family history on file. History  Substance Use Topics  .  Smoking status: Never Smoker   . Smokeless tobacco: Never Used  . Alcohol Use: No   OB History   Grav Para Term Preterm Abortions TAB SAB Ect Mult Living                 Review of Systems  Constitutional: Negative for fever and chills.  HENT: Negative for sore throat.   Gastrointestinal: Positive for abdominal pain.  Genitourinary: Negative for dysuria, frequency, hematuria and flank pain.  All other systems reviewed and are negative.    Allergies  Review of patient's allergies indicates no known allergies.  Home Medications   Current Outpatient Rx  Name  Route  Sig  Dispense  Refill  . ALPRAZolam (XANAX) 0.25 MG tablet   Oral   Take 0.25 mg by mouth at bedtime as needed for anxiety. For anxiety         . amLODipine (NORVASC) 10 MG tablet   Oral   Take 10 mg by mouth every evening.          Marland Kitchen atenolol (TENORMIN) 25 MG tablet   Oral   Take 25 mg by mouth every evening.          . butalbital-acetaminophen-caffeine (FIORICET, ESGIC) 50-325-40 MG per tablet   Oral   Take 1 tablet by mouth 2 (two) times daily as needed for headache.         . cetirizine (ZYRTEC) 10 MG tablet   Oral   Take 10 mg by mouth daily  as needed for allergies.         . chlorthalidone (HYGROTON) 25 MG tablet   Oral   Take 25 mg by mouth every evening.          . fluticasone (FLONASE) 50 MCG/ACT nasal spray   Nasal   Place 2 sprays into the nose daily as needed for rhinitis.         Marland Kitchen ibuprofen (ADVIL,MOTRIN) 800 MG tablet   Oral   Take 800 mg by mouth every 8 (eight) hours as needed for pain.         Marland Kitchen ondansetron (ZOFRAN) 4 MG tablet   Oral   Take 4 mg by mouth every 8 (eight) hours as needed for nausea.         Marland Kitchen oxyCODONE-acetaminophen (PERCOCET/ROXICET) 5-325 MG per tablet   Oral   Take 1 tablet by mouth every 4 (four) hours as needed for pain.         . phenazopyridine (PYRIDIUM) 97 MG tablet   Oral   Take 97 mg by mouth 3 (three) times daily as needed for  pain.          BP 143/96  Pulse 85  Temp(Src) 98.7 F (37.1 C) (Oral)  Resp 15  Ht 5\' 4"  (1.626 m)  Wt 258 lb (117.028 kg)  BMI 44.26 kg/m2  SpO2 98%  LMP 01/05/2013 Physical Exam  Nursing note and vitals reviewed. Constitutional: She is oriented to person, place, and time. She appears well-developed and well-nourished. No distress.  HENT:  Head: Normocephalic and atraumatic.  Eyes: Pupils are equal, round, and reactive to light.  Cardiovascular: Normal rate and regular rhythm.   Pulmonary/Chest: Effort normal and breath sounds normal.  Abdominal: Soft. She exhibits no distension. There is tenderness. There is CVA tenderness.  Bilateral CVA tenderness  Musculoskeletal: Normal range of motion.  Neurological: She is alert and oriented to person, place, and time.  Skin: Skin is warm. No erythema.    ED Course  Procedures (including critical care time) Labs Review Labs Reviewed  URINALYSIS, ROUTINE W REFLEX MICROSCOPIC - Abnormal; Notable for the following:    Color, Urine RED (*)    APPearance CLOUDY (*)    Specific Gravity, Urine 1.036 (*)    Hgb urine dipstick LARGE (*)    Bilirubin Urine SMALL (*)    Protein, ur 100 (*)    Nitrite POSITIVE (*)    Leukocytes, UA MODERATE (*)    All other components within normal limits  URINE MICROSCOPIC-ADD ON - Abnormal; Notable for the following:    Squamous Epithelial / LPF FEW (*)    Bacteria, UA FEW (*)    Crystals CA OXALATE CRYSTALS (*)    All other components within normal limits  URINE CULTURE   Imaging Review No results found.  EKG Interpretation   None       MDM   1. Flank pain     Will obtain urine sample to check for infection, provide pain control.  Once.  This is obtained.  Patient will be discharged home to followup with urology as scheduled for 3:30.  This afternoon Urine has been cultured and will be reviewed by the urologist    Arman Filter, NP 01/11/13 0530  Arman Filter, NP 01/22/13  2020

## 2013-01-11 NOTE — ED Notes (Signed)
Pt had a right urethral stent placed last Thursday by Dr. Bjorn Loser kidney stone-stone has passed-due to get stent out at 1530 today-pt states she can't stand the pain any more and would like the stent removed

## 2013-01-11 NOTE — ED Provider Notes (Signed)
Medical screening examination/treatment/procedure(s) were performed by non-physician practitioner and as supervising physician I was immediately available for consultation/collaboration.  Olivia Mackie, MD 01/11/13 [phone]

## 2013-01-12 LAB — URINE CULTURE
Colony Count: NO GROWTH
Culture: NO GROWTH

## 2013-01-23 NOTE — ED Provider Notes (Signed)
Medical screening examination/treatment/procedure(s) were performed by non-physician practitioner and as supervising physician I was immediately available for consultation/collaboration.  EKG Interpretation   None        Olivia Mackie, MD 01/23/13 [phone]

## 2013-02-07 ENCOUNTER — Other Ambulatory Visit: Payer: Self-pay

## 2013-02-14 ENCOUNTER — Emergency Department (HOSPITAL_COMMUNITY)
Admission: EM | Admit: 2013-02-14 | Discharge: 2013-02-14 | Disposition: A | Discharge status: Home / Self Care | Payer: PRIVATE HEALTH INSURANCE | Attending: Emergency Medicine | Admitting: Emergency Medicine

## 2013-02-14 ENCOUNTER — Encounter (HOSPITAL_COMMUNITY): Payer: Self-pay | Admitting: Emergency Medicine

## 2013-02-14 DIAGNOSIS — G43909 Migraine, unspecified, not intractable, without status migrainosus: Secondary | ICD-10-CM | POA: Insufficient documentation

## 2013-02-14 DIAGNOSIS — IMO0002 Reserved for concepts with insufficient information to code with codable children: Secondary | ICD-10-CM | POA: Insufficient documentation

## 2013-02-14 DIAGNOSIS — Z79899 Other long term (current) drug therapy: Secondary | ICD-10-CM | POA: Insufficient documentation

## 2013-02-14 DIAGNOSIS — I10 Essential (primary) hypertension: Secondary | ICD-10-CM | POA: Insufficient documentation

## 2013-02-14 DIAGNOSIS — Z87442 Personal history of urinary calculi: Secondary | ICD-10-CM | POA: Insufficient documentation

## 2013-02-14 MED ORDER — DIPHENHYDRAMINE HCL 50 MG/ML IJ SOLN
12.5000 mg | Freq: Once | INTRAMUSCULAR | Status: AC
  Administered 2013-02-14: 12.5 mg via INTRAVENOUS
  Filled 2013-02-14: qty 1

## 2013-02-14 MED ORDER — SODIUM CHLORIDE 0.9 % IV SOLN: 1000.0000 mL | Freq: Once | INTRAVENOUS | Status: DC

## 2013-02-14 MED ORDER — KETOROLAC TROMETHAMINE 30 MG/ML IJ SOLN
30.0000 mg | Freq: Once | INTRAMUSCULAR | Status: AC
  Administered 2013-02-14: 30 mg via INTRAVENOUS
  Filled 2013-02-14: qty 1

## 2013-02-14 MED ORDER — METOCLOPRAMIDE HCL 5 MG/ML IJ SOLN
10.0000 mg | Freq: Once | INTRAMUSCULAR | Status: AC
  Administered 2013-02-14: 10 mg via INTRAVENOUS
  Filled 2013-02-14: qty 2

## 2013-02-14 MED ORDER — HYDROMORPHONE HCL PF 1 MG/ML IJ SOLN: 1.0000 mg | Freq: Once | INTRAMUSCULAR | Status: DC

## 2013-02-14 MED ORDER — METOCLOPRAMIDE HCL 10 MG PO TABS: 10.0000 mg | ORAL_TABLET | Freq: Four times a day (QID) | ORAL | Status: DC | PRN

## 2013-02-14 MED ORDER — ONDANSETRON HCL 4 MG/2ML IJ SOLN: 4.0000 mg | Freq: Once | INTRAMUSCULAR | Status: DC

## 2013-02-14 MED ORDER — SODIUM CHLORIDE 0.9 % IV BOLUS (SEPSIS)
1000.0000 mL | Freq: Once | INTRAVENOUS | Status: AC
  Administered 2013-02-14: 1000 mL via INTRAVENOUS

## 2013-02-14 NOTE — ED Notes (Signed)
Pt states that she has had migraines off and on for 2 weeks now and that tonight she had an episode of vomiting x1.

## 2013-02-14 NOTE — ED Notes (Signed)
Pt complains of a migraine with no relief from meds, states that she's also nauseated

## 2013-02-14 NOTE — ED Provider Notes (Signed)
CSN: [id number]     Arrival date & time 02/14/13  0054 History   First MD Initiated Contact with Patient 02/14/13 0153     Chief Complaint  Patient presents with  . Migraine   (Consider location/radiation/quality/duration/timing/severity/associated sxs/prior Treatment) HPI Comments: Patient states she has a headache every day.  Normally.  She can take Urised and rest and I will go away, but she developed this headache at work, and was unable to take her medication  Patient is a 32 y.o. female presenting with migraines. The history is provided by the patient.  Migraine This is a chronic problem. The current episode started today. The problem has been unchanged. Associated symptoms include headaches and nausea. Pertinent negatives include no chills, fever, neck pain or vomiting. The symptoms are aggravated by stress. Treatments tried: Fioricet. The treatment provided no relief.    Past Medical History  Diagnosis Date  . Hypertension   . Migraines   . Renal disorder     kidney stones  . Mitral regurgitation   . Tricuspid regurgitation   . Kidney stone    Past Surgical History  Procedure Laterality Date  . Lithotripsy    . Cystoscopy w/ ureteral stent placement Right 01/03/2013    Procedure: CYSTOSCOPY WITH RIGHT RETROGRADE PYELOGRAM/ RIGHT URETERAL STENT PLACEMENT;  Surgeon: Martina Sinner, MD;  Location: [facility];  Service: Urology;  Laterality: Right;   History reviewed. No pertinent family history. History  Substance Use Topics  . Smoking status: Never Smoker   . Smokeless tobacco: Never Used  . Alcohol Use: No   OB History   Grav Para Term Preterm Abortions TAB SAB Ect Mult Living                 Review of Systems  Constitutional: Negative for fever and chills.  Eyes: Positive for photophobia.  Gastrointestinal: Positive for nausea. Negative for vomiting.  Musculoskeletal: Negative for neck pain.  Neurological: Positive for headaches. Negative for dizziness.  All  other systems reviewed and are negative.    Allergies  Review of patient's allergies indicates no known allergies.  Home Medications   Current Outpatient Rx  Name  Route  Sig  Dispense  Refill  . ALPRAZolam (XANAX) 0.25 MG tablet   Oral   Take 0.25 mg by mouth at bedtime as needed for anxiety. For anxiety         . amLODipine (NORVASC) 10 MG tablet   Oral   Take 10 mg by mouth every evening.          Marland Kitchen atenolol (TENORMIN) 25 MG tablet   Oral   Take 25 mg by mouth every evening.          . butalbital-acetaminophen-caffeine (FIORICET, ESGIC) 50-325-40 MG per tablet   Oral   Take 1 tablet by mouth 2 (two) times daily as needed for headache.         . cetirizine (ZYRTEC) 10 MG tablet   Oral   Take 10 mg by mouth daily as needed for allergies.         . chlorthalidone (HYGROTON) 25 MG tablet   Oral   Take 25 mg by mouth every evening.          . fluticasone (FLONASE) 50 MCG/ACT nasal spray   Nasal   Place 2 sprays into the nose daily as needed for rhinitis.         Marland Kitchen ibuprofen (ADVIL,MOTRIN) 800 MG tablet   Oral   Take 800  mg by mouth every 8 (eight) hours as needed for pain.         Marland Kitchen ondansetron (ZOFRAN) 4 MG tablet   Oral   Take 4 mg by mouth every 8 (eight) hours as needed for nausea.         Marland Kitchen oxyCODONE-acetaminophen (PERCOCET/ROXICET) 5-325 MG per tablet   Oral   Take 1 tablet by mouth every 4 (four) hours as needed for pain.         . phenazopyridine (PYRIDIUM) 97 MG tablet   Oral   Take 97 mg by mouth 3 (three) times daily as needed for pain.         Marland Kitchen metoCLOPramide (REGLAN) 10 MG tablet   Oral   Take 1 tablet (10 mg total) by mouth every 6 (six) hours as needed for nausea (for headache pain).   30 tablet   0    BP 138/88  Pulse 80  Temp(Src) 98.5 F (36.9 C) (Oral)  Resp 18  Ht 5\' 4"  (1.626 m)  Wt 250 lb (113.399 kg)  BMI 42.89 kg/m2  SpO2 98%  LMP 02/02/2013 Physical Exam  Nursing note and vitals  reviewed. Constitutional: She is oriented to person, place, and time. She appears well-developed and well-nourished.  HENT:  Head: Normocephalic and atraumatic.  Eyes: Pupils are equal, round, and reactive to light.  Neck: Normal range of motion.  Cardiovascular: Normal rate.   Pulmonary/Chest: Effort normal.  Musculoskeletal: Normal range of motion.  Lymphadenopathy:    She has no cervical adenopathy.  Neurological: She is alert and oriented to person, place, and time.  Skin: Skin is warm. No rash noted.    ED Course  Procedures (including critical care time) Labs Review Labs Reviewed - No data to display Imaging Review No results found.  EKG Interpretation   None       MDM   1. Migraine     Headache, has, resolved.  Patient has been given a prescription for she came to her normal.  Fioricet to aid in relief of her headache    Arman Filter, NP 02/14/13 [phone]

## 2013-02-14 NOTE — ED Provider Notes (Signed)
Medical screening examination/treatment/procedure(s) were performed by non-physician practitioner and as supervising physician I was immediately available for consultation/collaboration.    Olivia Mackie, MD 02/14/13 [phone]

## 2013-11-19 ENCOUNTER — Ambulatory Visit: Payer: BC Managed Care – PPO | Admitting: Family

## 2013-11-19 ENCOUNTER — Telehealth: Payer: Self-pay | Admitting: Family

## 2013-11-19 ENCOUNTER — Ambulatory Visit (INDEPENDENT_AMBULATORY_CARE_PROVIDER_SITE_OTHER): Payer: BC Managed Care – PPO | Admitting: Family

## 2013-11-19 ENCOUNTER — Encounter: Payer: Self-pay | Admitting: Family

## 2013-11-19 VITALS — BP 140/98 | HR 97 | Temp 98.7°F | Ht 63.0 in | Wt 287.0 lb

## 2013-11-19 DIAGNOSIS — R635 Abnormal weight gain: Secondary | ICD-10-CM

## 2013-11-19 DIAGNOSIS — Z0000 Encounter for general adult medical examination without abnormal findings: Secondary | ICD-10-CM

## 2013-11-19 DIAGNOSIS — E669 Obesity, unspecified: Secondary | ICD-10-CM

## 2013-11-19 DIAGNOSIS — Z6841 Body Mass Index (BMI) 40.0 and over, adult: Secondary | ICD-10-CM | POA: Insufficient documentation

## 2013-11-19 DIAGNOSIS — G43909 Migraine, unspecified, not intractable, without status migrainosus: Secondary | ICD-10-CM | POA: Insufficient documentation

## 2013-11-19 DIAGNOSIS — I10 Essential (primary) hypertension: Secondary | ICD-10-CM | POA: Insufficient documentation

## 2013-11-19 DIAGNOSIS — G43109 Migraine with aura, not intractable, without status migrainosus: Secondary | ICD-10-CM

## 2013-11-19 LAB — CBC WITH DIFFERENTIAL/PLATELET
BASOS PCT: 0.3 % (ref 0.0–3.0)
Basophils Absolute: 0 10*3/uL (ref 0.0–0.1)
EOS ABS: 0.1 10*3/uL (ref 0.0–0.7)
EOS PCT: 1.1 % (ref 0.0–5.0)
HCT: 40.5 % (ref 36.0–46.0)
Hemoglobin: 13.8 g/dL (ref 12.0–15.0)
LYMPHS PCT: 21.1 % (ref 12.0–46.0)
Lymphs Abs: 1.8 10*3/uL (ref 0.7–4.0)
MCHC: 34 g/dL (ref 30.0–36.0)
MCV: 93.6 fl (ref 78.0–100.0)
Monocytes Absolute: 0.4 10*3/uL (ref 0.1–1.0)
Monocytes Relative: 5.2 % (ref 3.0–12.0)
NEUTROS PCT: 72.3 % (ref 43.0–77.0)
Neutro Abs: 6.1 10*3/uL (ref 1.4–7.7)
Platelets: 311 10*3/uL (ref 150.0–400.0)
RBC: 4.33 Mil/uL (ref 3.87–5.11)
RDW: 13.9 % (ref 11.5–15.5)
WBC: 8.5 10*3/uL (ref 4.0–10.5)

## 2013-11-19 LAB — COMPREHENSIVE METABOLIC PANEL
ALBUMIN: 3.8 g/dL (ref 3.5–5.2)
ALT: 28 U/L (ref 0–35)
AST: 20 U/L (ref 0–37)
Alkaline Phosphatase: 97 U/L (ref 39–117)
BUN: 15 mg/dL (ref 6–23)
CHLORIDE: 103 meq/L (ref 96–112)
CO2: 25 mEq/L (ref 19–32)
Calcium: 9 mg/dL (ref 8.4–10.5)
Creatinine, Ser: 0.9 mg/dL (ref 0.4–1.2)
GFR: 83.04 mL/min (ref >60.00)
GLUCOSE: 85 mg/dL (ref 70–99)
POTASSIUM: 4.6 meq/L (ref 3.5–5.1)
Sodium: 138 mEq/L (ref 135–145)
Total Bilirubin: 0.8 mg/dL (ref 0.2–1.2)
Total Protein: 7.7 g/dL (ref 6.0–8.3)

## 2013-11-19 LAB — POCT URINALYSIS DIPSTICK
Bilirubin, UA: NEGATIVE
Glucose, UA: NEGATIVE
Ketones, UA: NEGATIVE
Leukocytes, UA: NEGATIVE
Nitrite, UA: NEGATIVE
PH UA: 6.5
SPEC GRAV UA: 1.02
Urobilinogen, UA: 0.2

## 2013-11-19 LAB — TSH: TSH: 1.28 u[IU]/mL (ref 0.35–4.50)

## 2013-11-19 LAB — LIPID PANEL
CHOL/HDL RATIO: 4
CHOLESTEROL: 163 mg/dL (ref 0–200)
HDL: 44 mg/dL (ref >39.00)
LDL CALC: 103 mg/dL — AB (ref 0–99)
NONHDL: 119
Triglycerides: 81 mg/dL (ref 0.0–149.0)
VLDL: 16.2 mg/dL (ref 0.0–40.0)

## 2013-11-19 MED ORDER — PROPRANOLOL HCL ER 80 MG PO CP24: 80.0000 mg | ORAL_CAPSULE | Freq: Every day | ORAL | Status: DC

## 2013-11-19 NOTE — Telephone Encounter (Signed)
Spoke with Faith to advise that [facility] as she is already gone for the day.   [facility],, please advise

## 2013-11-19 NOTE — Progress Notes (Signed)
Pre visit review using our clinic review tool, if applicable. No additional management support is needed unless otherwise documented below in the visit note. 

## 2013-11-19 NOTE — Progress Notes (Signed)
Subjective:    Patient ID: Sara Ramirez, female    DOB: 04-21-1980, 33 y.o.   MRN: [id number]  HPI 33 year old white female, nonsmoker with a history of obesity, hypertension, migraine headaches, and trivial tricuspid regurgitation today to be established. She has been under the care of your cardiology was done stress test and echocardiogram related to heart pounding that essentially all been normal. She has had a 30 pound weight gain over the last 8 months. Not currently exercising. Her forced decreased energy levels. Last complete physical was in 2008. She is married with one son, 79 years old. Has been off all medications since January.   Review of Systems  Constitutional: Negative.   HENT: Negative for congestion.   Eyes: Negative.   Respiratory: Negative.   Cardiovascular: Positive for palpitations and leg swelling. Negative for chest pain.  Gastrointestinal: Negative.   Endocrine: Negative.   Genitourinary: Negative.   Musculoskeletal: Negative.   Skin: Negative.   Allergic/Immunologic: Negative.   Neurological: Positive for headaches. Negative for light-headedness.  Hematological: Negative.   Psychiatric/Behavioral: Negative.    Past Medical History  Diagnosis Date  . Hypertension   . Migraines   . Renal disorder     kidney stones  . Mitral regurgitation   . Tricuspid regurgitation   . Kidney stone     History   Social History  . Marital Status: Married    Spouse Name: N/A    Number of Children: N/A  . Years of Education: N/A   Occupational History  . Not on file.   Social History Main Topics  . Smoking status: Never Smoker   . Smokeless tobacco: Never Used  . Alcohol Use: No  . Drug Use: No  . Sexual Activity: Not on file   Other Topics Concern  . Not on file   Social History Narrative  . No narrative on file    Past Surgical History  Procedure Laterality Date  . Lithotripsy    . Cystoscopy w/ ureteral stent placement Right 01/03/2013      Procedure: CYSTOSCOPY WITH RIGHT RETROGRADE PYELOGRAM/ RIGHT URETERAL STENT PLACEMENT;  Surgeon: Martina Sinner, MD;  Location: [facility];  Service: Urology;  Laterality: Right;    No family history on file.  No Known Allergies  No current outpatient prescriptions on file prior to visit.   No current facility-administered medications on file prior to visit.    BP 140/98  Pulse 97  Temp(Src) 98.7 F (37.1 C) (Oral)  Ht 5\' 3"  (1.6 m)  Wt 287 lb (130.182 kg)  BMI 50.85 kg/m2  LMP 08/17/2015chart    Objective:   Physical Exam  Constitutional: She is oriented to person, place, and time. She appears well-developed and well-nourished.  HENT:  Right Ear: External ear normal.  Left Ear: External ear normal.  Nose: Nose normal.  Mouth/Throat: Oropharynx is clear and moist.  Neck: Normal range of motion. Neck supple.  Cardiovascular: Normal rate, regular rhythm and normal heart sounds.   Pulmonary/Chest: Effort normal and breath sounds normal.  Abdominal: Soft. Bowel sounds are normal.  Musculoskeletal: Normal range of motion. She exhibits no edema.  Neurological: She is alert and oriented to person, place, and time.  Skin: Skin is warm and dry.  Psychiatric: She has a normal mood and affect.          Assessment & Plan:  Sara Ramirez was seen today for establish care.  Diagnoses and associated orders for this visit:  Migraine syndrome - TSH -  CBC with Differential - CMP  Unspecified essential hypertension - TSH - CBC with Differential - CMP  Obesity, unspecified - TSH - CBC with Differential - CMP  Weight gain - TSH - CBC with Differential - CMP  Preventative health care - POC Urinalysis Dipstick - Lipid Panel  Other Orders - propranolol ER (INDERAL LA) 80 MG 24 hr capsule; Take 1 capsule (80 mg total) by mouth daily.    start propranolol to help blood pressure as well as migraine headache prevention. Encouraged healthy diet, exercise, weight  reduction. Monthly self breast exams. Recheck for complete physical exam in 3 weeks.

## 2013-11-19 NOTE — Telephone Encounter (Signed)
Pt's sister called bc pt is at work and now has a migraine. Pt would like Imitrex called into [facility]/ [address] Did not see on pt's med list. Sis states pt was a new pt today.

## 2013-11-19 NOTE — Patient Instructions (Signed)
Exercise to Lose Weight Exercise and a healthy diet may help you lose weight. Your doctor may suggest specific exercises. EXERCISE IDEAS AND TIPS  Choose low-cost things you enjoy doing, such as walking, bicycling, or exercising to workout videos.  Take stairs instead of the elevator.  Walk during your lunch break.  Park your car further away from work or school.  Go to a gym or an exercise class.  Start with 5 to 10 minutes of exercise each day. Build up to 30 minutes of exercise 4 to 6 days a week.  Wear shoes with good support and comfortable clothes.  Stretch before and after working out.  Work out until you breathe harder and your heart beats faster.  Drink extra water when you exercise.  Do not do so much that you hurt yourself, feel dizzy, or get very short of breath. Exercises that burn about 150 calories:  Running 1  miles in 15 minutes.  Playing volleyball for 45 to 60 minutes.  Washing and waxing a car for 45 to 60 minutes.  Playing touch football for 45 minutes.  Walking 1  miles in 35 minutes.  Pushing a stroller 1  miles in 30 minutes.  Playing basketball for 30 minutes.  Raking leaves for 30 minutes.  Bicycling 5 miles in 30 minutes.  Walking 2 miles in 30 minutes.  Dancing for 30 minutes.  Shoveling snow for 15 minutes.  Swimming laps for 20 minutes.  Walking up stairs for 15 minutes.  Bicycling 4 miles in 15 minutes.  Gardening for 30 to 45 minutes.  Jumping rope for 15 minutes.  Washing windows or floors for 45 to 60 minutes. Document Released: 04/23/2010 Document Revised: 06/13/2011 Document Reviewed: 04/23/2010 Exit[employer] Patient Information 2015 [employer]. This information is not intended to replace advice given to you by your health care provider. Make sure you discuss any questions you have with your health care provider.  

## 2013-11-20 ENCOUNTER — Encounter: Payer: Self-pay | Admitting: Family

## 2013-11-20 MED ORDER — SUMATRIPTAN SUCCINATE 50 MG PO TABS: 50.0000 mg | ORAL_TABLET | ORAL | Status: DC | PRN

## 2013-12-10 ENCOUNTER — Encounter: Payer: Self-pay | Admitting: Family

## 2013-12-24 ENCOUNTER — Encounter: Payer: BC Managed Care – PPO | Admitting: Family

## 2013-12-24 DIAGNOSIS — Z0289 Encounter for other administrative examinations: Secondary | ICD-10-CM

## 2014-01-08 ENCOUNTER — Other Ambulatory Visit: Payer: Self-pay | Admitting: Family

## 2014-01-08 ENCOUNTER — Encounter: Payer: Self-pay | Admitting: Family

## 2014-01-08 MED ORDER — PHENTERMINE HCL 37.5 MG PO CAPS: 37.5000 mg | ORAL_CAPSULE | ORAL | Status: DC

## 2014-01-30 ENCOUNTER — Encounter: Payer: BC Managed Care – PPO | Admitting: Family

## 2014-01-30 DIAGNOSIS — Z0289 Encounter for other administrative examinations: Secondary | ICD-10-CM

## 2014-02-11 ENCOUNTER — Ambulatory Visit: Payer: BC Managed Care – PPO | Admitting: Family

## 2014-09-25 ENCOUNTER — Ambulatory Visit (INDEPENDENT_AMBULATORY_CARE_PROVIDER_SITE_OTHER): Payer: BLUE CROSS/BLUE SHIELD | Admitting: Nurse Practitioner

## 2014-09-25 ENCOUNTER — Encounter: Payer: Self-pay | Admitting: Nurse Practitioner

## 2014-09-25 VITALS — BP 129/88 | HR 58 | Temp 98.6°F | Resp 16 | Ht 63.5 in | Wt 276.0 lb

## 2014-09-25 DIAGNOSIS — R079 Chest pain, unspecified: Secondary | ICD-10-CM

## 2014-09-25 DIAGNOSIS — I10 Essential (primary) hypertension: Secondary | ICD-10-CM | POA: Diagnosis not present

## 2014-09-25 LAB — BASIC METABOLIC PANEL
BUN: 14 mg/dL (ref 6–23)
CALCIUM: 9.2 mg/dL (ref 8.4–10.5)
CO2: 24 mEq/L (ref 19–32)
CREATININE: 0.77 mg/dL (ref 0.40–1.20)
Chloride: 105 mEq/L (ref 96–112)
GFR: 92.56 mL/min (ref >60.00)
GLUCOSE: 93 mg/dL (ref 70–99)
Potassium: 3.9 mEq/L (ref 3.5–5.1)
SODIUM: 135 meq/L (ref 135–145)

## 2014-09-25 LAB — T4, FREE: FREE T4: 0.75 ng/dL (ref 0.60–1.60)

## 2014-09-25 LAB — TSH: TSH: 1.39 u[IU]/mL (ref 0.35–4.50)

## 2014-09-25 MED ORDER — AMLODIPINE BESYLATE 10 MG PO TABS: 10.0000 mg | ORAL_TABLET | Freq: Every day | ORAL | Status: DC

## 2014-09-25 MED ORDER — LISINOPRIL-HYDROCHLOROTHIAZIDE 10-12.5 MG PO TABS: 1.0000 | ORAL_TABLET | Freq: Every day | ORAL | Status: DC

## 2014-09-25 MED ORDER — LORAZEPAM 0.5 MG PO TABS: ORAL_TABLET | ORAL | Status: DC

## 2014-09-25 NOTE — Progress Notes (Signed)
Subjective:    Sara Ramirez is a 34 y.o. female who presents for evaluation of chest pain. Onset was 4 days ago. Symptoms have improved since that time, but continues to feel pressure in chest. The patient describes the pain as pressure and does not radiate. Patient rates pain as a 4/10 in intensity. Associated symptoms are: none. Aggravating factors are: none. Alleviating factors are: none. Patient's cardiac risk factors are: hypertension, obesity (BMI >= 30 kg/m2) and sedentary lifestyle. Patient's risk factors for DVT/PE: none. Previous cardiac testing: chest x-ray, electrocardiogram (ECG), exercise stress test ., Holter monitor, kidney function and potassium. She denies heartburn. Pt reports went to [facility] when pain started-woke her from sleep, initially sharp & stabbing then changed to pressure. She took 2 T atenolol without relief, then went to Er. Reviewed ER note: BP initially 210/129, pulse 69. Bp 142/92 prior to d/c w/out administration of meds. EKG NSR. CMET-alk phos 116 o/w nml. CBC nml. CXR nml. Pt d/c home & told to est w/PCP. Pt reports she saw cardiology in past-Dr Radford Pax. Told she has MV & tricuspid valve regurg. She has not f/u in over 1 yr. She is not taking atenolol, propranolol, chlorthalidone, or norvasc regularly-takes when BP is "high" -pt says she gets HA when high. She doesn't think meds work, so doesn't take them daily.  When asked about anxiety, she says she has scripts for zoloft & xanax but does not take them.  She says all her "heart problems" satrted after she took phentermine several years ago and lost 50 lbs.   Today, she took norvasc & atenolol. The following portions of the patient's history were reviewed and updated as appropriate: allergies, current medications, past family history, past medical history, past social history, past surgical history and problem list.  Review of Systems Constitutional: negative for fatigue Respiratory: negative for  cough Cardiovascular: positive for lower extremity edema    Objective:    BP 129/88 mmHg  Pulse 58  Temp(Src) 98.6 F (37 C) (Temporal)  Resp 16  Ht 5' 3.5" (1.613 m)  Wt 276 lb (125.193 kg)  BMI 48.12 kg/m2  SpO2 98%  LMP 09/12/2014 General appearance: alert, cooperative, appears stated age and no distress Eyes: negative findings: lids and lashes normal and conjunctivae and sclerae normal Lungs: clear to auscultation bilaterally Heart: bradycardia, regular, no murmur Extremities: extremities normal, atraumatic, no cyanosis or edema Pulses: 2+ and symmetric Neurologic: Grossly normal  Cardiographics ECG: sinus brady rate 54, short PR    Assessment:Plan   1. Chest pain, unspecified chest pain type DD: angina, anxiety, GI, med SE - EKG 12-Lead - LORazepam (ATIVAN) 0.5 MG tablet; Take 1 to 2 T PO qd PRN anxiety  Dispense: 30 tablet; Refill: 0 - Ambulatory referral to Cardiology - T4, free - TSH - Thyroid peroxidase antibody - Basic metabolic panel  2. Essential hypertension, benign Start  - lisinopril-hydrochlorothiazide (PRINZIDE,ZESTORETIC) 10-12.5 MG per tablet; Take 1 tablet by mouth daily.  Dispense: 30 tablet; Refill: 0 continue - amLODipine (NORVASC) 10 MG tablet; Take 1 tablet (10 mg total) by mouth daily.  Dispense: 30 tablet; Refill: 0 Stop atenolol 2 baby aspirin qd - Ambulatory referral to Cardiology  F/u 1 wks-BP, CP

## 2014-09-25 NOTE — Patient Instructions (Addendum)
Please follow up with cardiology.  Please start new blood pressure medicines. Stop atenolol. Take lorazepam when you feel heart is racing. If medicine relieves problem, it is related to anxiety.   Start taking a walk daily-at least 15 minutes, work up to 30 minutes.   Please see [place] in 2 weeks.

## 2014-09-25 NOTE — Progress Notes (Signed)
Pre visit review using our clinic review tool, if applicable. No additional management support is needed unless otherwise documented below in the visit note. 

## 2014-09-26 ENCOUNTER — Telehealth: Payer: Self-pay | Admitting: Nurse Practitioner

## 2014-09-26 LAB — THYROID PEROXIDASE ANTIBODY: Thyroperoxidase Ab SerPl-aCnc: <1 IU/mL (ref <9)

## 2014-09-26 NOTE — Telephone Encounter (Signed)
pls call pt: Advise  Labs nml. Keep f/u appt w/me & cardiology

## 2014-09-26 NOTE — Telephone Encounter (Signed)
Left detailed message on cell, okay per DPR.

## 2014-09-29 ENCOUNTER — Other Ambulatory Visit: Payer: Self-pay | Admitting: Nurse Practitioner

## 2014-09-29 ENCOUNTER — Encounter: Payer: Self-pay | Admitting: Nurse Practitioner

## 2014-09-29 DIAGNOSIS — K30 Functional dyspepsia: Secondary | ICD-10-CM

## 2014-09-29 MED ORDER — OMEPRAZOLE 40 MG PO CPDR: 40.0000 mg | DELAYED_RELEASE_CAPSULE | Freq: Every day | ORAL | Status: DC

## 2014-10-02 ENCOUNTER — Encounter: Payer: Self-pay | Admitting: Nurse Practitioner

## 2014-10-02 ENCOUNTER — Ambulatory Visit (INDEPENDENT_AMBULATORY_CARE_PROVIDER_SITE_OTHER): Payer: BLUE CROSS/BLUE SHIELD | Admitting: Nurse Practitioner

## 2014-10-02 VITALS — BP 124/84 | HR 92 | Temp 98.2°F | Resp 18 | Ht 63.5 in | Wt 274.0 lb

## 2014-10-02 DIAGNOSIS — K30 Functional dyspepsia: Secondary | ICD-10-CM | POA: Insufficient documentation

## 2014-10-02 DIAGNOSIS — F418 Other specified anxiety disorders: Secondary | ICD-10-CM | POA: Diagnosis not present

## 2014-10-02 DIAGNOSIS — I10 Essential (primary) hypertension: Secondary | ICD-10-CM | POA: Diagnosis not present

## 2014-10-02 DIAGNOSIS — R1013 Epigastric pain: Secondary | ICD-10-CM | POA: Diagnosis not present

## 2014-10-02 DIAGNOSIS — R0789 Other chest pain: Secondary | ICD-10-CM

## 2014-10-02 MED ORDER — SERTRALINE HCL 25 MG PO TABS: 25.0000 mg | ORAL_TABLET | Freq: Every day | ORAL | Status: DC

## 2014-10-02 NOTE — Progress Notes (Signed)
Subjective:     Sara Ramirez is a 34 y.o. female presents for follow of HTN & chest discomfort.  She was seen in ofc last week: meds were changed: atenolol stopped, started lisinopril/HCTZ, cont norvasc, start omeprazole & start lorazepam.  Today she states she took ativan every day that she was at work due to chest discomfort. Pain resolved after taking med. She identifies her job as stressful & has stressful relationship w/ex-husband. She had some eisodes of chest discomfort several yrs ago, before she started current job. See oV note dated 6/23 for "cardiac Hx". Pt has appt w/cardio next week. [facility] for [facility] for stress management , establishing personal boundaries. Pt in agreement.  She feels well on new blood pressure medicine, not feeling tired or sluggish as when taking atenolol.  She is checking bp at work-running around 117/76.  She had several days of indigestion described as feeling food was stuck in throat. She started omeprazole 40 mg a few days ago. Discussed diet changes.    She lost 2 lbs in 1 week-cut back soda, replced with water. The following portions of the patient's history were reviewed and updated as appropriate: allergies, current medications, past medical history, past social history, past surgical history and problem list.  Review of Systems Pertinent items are noted in HPI.    Objective:    BP 124/84 mmHg  Pulse 92  Temp(Src) 98.2 F (36.8 C) (Temporal)  Resp 18  Ht 5' 3.5" (1.613 m)  Wt 274 lb (124.286 kg)  BMI 47.77 kg/m2  SpO2 98%  LMP 09/12/2014 BP 124/84 mmHg  Pulse 92  Temp(Src) 98.2 F (36.8 C) (Temporal)  Resp 18  Ht 5' 3.5" (1.613 m)  Wt 274 lb (124.286 kg)  BMI 47.77 kg/m2  SpO2 98%  LMP 09/12/2014 General appearance: alert, cooperative, appears stated age and no distress Eyes: negative findings: lids and lashes normal and conjunctivae and sclerae normal Lungs: clear to auscultation bilaterally Heart: regular  rate and rhythm, S1, S2 normal, no murmur, click, rub or gallop    Assessment:     1. Situational anxiety start - sertraline (ZOLOFT) 25 MG tablet; Take 1 tablet (25 mg total) by mouth daily.  Dispense: 30 tablet; Refill: 1 - Ambulatory referral to Psychology  2. Essential hypertension Continue current meds 3. Indigestion Continue omeprazole Avoid food triggers & over eating  4. Other chest pain Likely r/t anxiety Keep cardio appt.  F/u 4 wks-bp, anxiety, cp

## 2014-10-02 NOTE — Assessment & Plan Note (Signed)
Well controlled Cont current meds

## 2014-10-02 NOTE — Patient Instructions (Addendum)
Start zoloft for anxiety management. Continue to take lorazepam as needed.  Please see behavioral health to learn coping strategies & establish boundaries to best manage stress.  Continue blood pressure medicines.    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 Take a daily walk-it helps with stress!  See me in 4 weeks.

## 2014-10-02 NOTE — Progress Notes (Signed)
Pre visit review using our clinic review tool, if applicable. No additional management support is needed unless otherwise documented below in the visit note. 

## 2014-10-09 ENCOUNTER — Encounter: Payer: Self-pay | Admitting: Cardiology

## 2014-10-09 ENCOUNTER — Ambulatory Visit (INDEPENDENT_AMBULATORY_CARE_PROVIDER_SITE_OTHER): Payer: BLUE CROSS/BLUE SHIELD | Admitting: Cardiology

## 2014-10-09 VITALS — BP 113/78 | HR 94 | Ht 63.0 in | Wt 274.8 lb

## 2014-10-09 DIAGNOSIS — R079 Chest pain, unspecified: Secondary | ICD-10-CM | POA: Diagnosis not present

## 2014-10-09 DIAGNOSIS — R002 Palpitations: Secondary | ICD-10-CM | POA: Diagnosis not present

## 2014-10-09 MED ORDER — DILTIAZEM HCL 30 MG PO TABS: 30.0000 mg | ORAL_TABLET | Freq: Two times a day (BID) | ORAL | Status: DC

## 2014-10-09 NOTE — Patient Instructions (Signed)
Your physician has recommended you make the following change in your medication:  Stop norvasc. Start diltiazem 30 mg twice daily. Continue all other medications the same. Your physician recommends that you schedule a follow-up appointment in: 4 weeks.

## 2014-10-09 NOTE — Progress Notes (Signed)
Clinical Summary [name] is a 34 y.o.female seen today as a new patient for the following medical problems.  1. Chest pain - symptoms started about 2 years ago. Pain can occur at rest or with exertion. Sharp pain midchest, varies is severity, at its worst its 8/10 in severity. + palpitations. +SOB. Not positional. No relation to food. Palpitations last just a few seconds, pain can last up to 1 hour. + LE edema over the last year. Notes some orthopnea - Notes some DOE at times.Often brought on by stress or being irritated. Palpitaitons can awake from sleep - episodes occur 1-2 times a week - no coffee, no tea, sodas Mt Dew x1-2 20oz, no energy drinks, no EtOH - better with ativan - tried on atenolol without benefit, felt symptoms made worst.   CAD risk factors: HTN, maternal grandfather stents 74s,     Past Medical History  Diagnosis Date  . Hypertension   . Migraines   . Renal disorder     kidney stones  . Mitral regurgitation   . Tricuspid regurgitation   . Kidney stone      No Known Allergies   Current Outpatient Prescriptions  Medication Sig Dispense Refill  . amLODipine (NORVASC) 10 MG tablet Take 1 tablet (10 mg total) by mouth daily. 30 tablet 0  . lisinopril-hydrochlorothiazide (PRINZIDE,ZESTORETIC) 10-12.5 MG per tablet Take 1 tablet by mouth daily. 30 tablet 0  . LORazepam (ATIVAN) 0.5 MG tablet Take 1 to 2 T PO qd PRN anxiety 30 tablet 0  . omeprazole (PRILOSEC) 40 MG capsule Take 1 capsule (40 mg total) by mouth daily. 30 capsule 0  . sertraline (ZOLOFT) 25 MG tablet Take 1 tablet (25 mg total) by mouth daily. 30 tablet 1   No current facility-administered medications for this visit.     Past Surgical History  Procedure Laterality Date  . Lithotripsy    . Cystoscopy w/ ureteral stent placement Right 01/03/2013    Procedure: CYSTOSCOPY WITH RIGHT RETROGRADE PYELOGRAM/ RIGHT URETERAL STENT PLACEMENT;  Surgeon: Martina Sinner, MD;  Location: [facility];  Service: Urology;  Laterality: Right;     No Known Allergies    Family History  Problem Relation Age of Onset  . Diabetes Father      Social History [name] reports that she has never smoked. She has never used smokeless tobacco. [name] reports that she does not drink alcohol.   Review of Systems CONSTITUTIONAL: No weight loss, fever, chills, weakness or fatigue.  HEENT: Eyes: No visual loss, blurred vision, double vision or yellow sclerae.No hearing loss, sneezing, congestion, runny nose or sore throat.  SKIN: No rash or itching.  CARDIOVASCULAR: per HPI RESPIRATORY: No shortness of breath, cough or sputum.  GASTROINTESTINAL: No anorexia, nausea, vomiting or diarrhea. No abdominal pain or blood.  GENITOURINARY: No burning on urination, no polyuria NEUROLOGICAL: No headache, dizziness, syncope, paralysis, ataxia, numbness or tingling in the extremities. No change in bowel or bladder control.  MUSCULOSKELETAL: No muscle, back pain, joint pain or stiffness.  LYMPHATICS: No enlarged nodes. No history of splenectomy.  PSYCHIATRIC: No history of depression or anxiety.  ENDOCRINOLOGIC: No reports of sweating, cold or heat intolerance. No polyuria or polydipsia.  Marland Kitchen   Physical Examination Filed Vitals:   10/09/14 1143  BP: 113/78  Pulse: 94   Filed Vitals:   10/09/14 1143  Height:  (1.6 m)  Weight: 274 lb 12.8 oz (124.648 kg)    Gen: resting  comfortably, no acute distress HEENT: no scleral icterus, pupils equal round and reactive, no palptable cervical adenopathy,  CV: RRR, no m/r/g, no JVD Resp: Clear to auscultation bilaterally GI: abdomen is soft, non-tender, non-distended, normal bowel sounds, no hepatosplenomegaly MSK: extremities are warm, no edema.  Skin: warm, no rash Neuro:  no focal deficits Psych: appropriate affect   Diagnostic Studies  07/2011 echo LVEF 65%   Assessment and Plan  1. Chest pain/palpitations - she reports  prior stress testing and cardiac monitor at [facility] in 2013, will request records - symptoms did not improve on beta blocker previously, will try dilt 30mg  bid.  - pending results from prior testing and response to thearapy, will decide if any repeat testing indicated.    F/u 4 weeks      Antoine PocheJonathan F. Genavieve Mangiapane, M.D.

## 2014-10-20 ENCOUNTER — Other Ambulatory Visit: Payer: Self-pay | Admitting: Nurse Practitioner

## 2014-10-21 ENCOUNTER — Other Ambulatory Visit: Payer: Self-pay | Admitting: Nurse Practitioner

## 2014-10-21 DIAGNOSIS — I10 Essential (primary) hypertension: Secondary | ICD-10-CM

## 2014-10-21 DIAGNOSIS — K30 Functional dyspepsia: Secondary | ICD-10-CM

## 2014-10-21 DIAGNOSIS — R079 Chest pain, unspecified: Secondary | ICD-10-CM

## 2014-10-21 MED ORDER — OMEPRAZOLE 40 MG PO CPDR: 40.0000 mg | DELAYED_RELEASE_CAPSULE | Freq: Every day | ORAL | Status: DC

## 2014-10-21 MED ORDER — LISINOPRIL-HYDROCHLOROTHIAZIDE 10-12.5 MG PO TABS
1.0000 | ORAL_TABLET | Freq: Every day | ORAL | Status: DC
Start: 2014-10-21 — End: 2014-11-06

## 2014-10-21 MED ORDER — LORAZEPAM 0.5 MG PO TABS: ORAL_TABLET | ORAL | Status: DC

## 2014-10-23 ENCOUNTER — Encounter: Payer: Self-pay | Admitting: *Deleted

## 2014-10-30 ENCOUNTER — Ambulatory Visit (INDEPENDENT_AMBULATORY_CARE_PROVIDER_SITE_OTHER): Payer: BLUE CROSS/BLUE SHIELD | Admitting: Nurse Practitioner

## 2014-10-30 ENCOUNTER — Encounter: Payer: Self-pay | Admitting: Nurse Practitioner

## 2014-10-30 VITALS — BP 104/65 | HR 95 | Temp 97.7°F | Resp 16 | Ht 63.5 in | Wt 271.0 lb

## 2014-10-30 DIAGNOSIS — F418 Other specified anxiety disorders: Secondary | ICD-10-CM

## 2014-10-30 DIAGNOSIS — Z6841 Body Mass Index (BMI) 40.0 and over, adult: Secondary | ICD-10-CM | POA: Diagnosis not present

## 2014-10-30 DIAGNOSIS — K30 Functional dyspepsia: Secondary | ICD-10-CM

## 2014-10-30 DIAGNOSIS — I10 Essential (primary) hypertension: Secondary | ICD-10-CM | POA: Diagnosis not present

## 2014-10-30 DIAGNOSIS — R1013 Epigastric pain: Secondary | ICD-10-CM | POA: Diagnosis not present

## 2014-10-30 MED ORDER — SERTRALINE HCL 50 MG PO TABS: 50.0000 mg | ORAL_TABLET | Freq: Every day | ORAL | Status: DC

## 2014-10-30 MED ORDER — OMEPRAZOLE 20 MG PO CPDR: 20.0000 mg | DELAYED_RELEASE_CAPSULE | Freq: Every day | ORAL | Status: DC

## 2014-10-30 NOTE — Patient Instructions (Signed)
Start new prescription for omeprazole at lower dose. Take daily for 4 weeks. If no symptoms, decrease to as needed.  Start new prescription for zoloft at increased dose with goal of fewer anxiety episodes and no need for ativan.   GREAT JOB with weight loss!!!! Keep up the good work!  Blood pressure is slightly low, but continue both cardizem & lisinopril/HCTZ. Discuss changes with Dr Wyline Mood.   Please return in about 10 weeks to review med changes.   It has been a pleasure to partner with you in your healthcare!

## 2014-10-30 NOTE — Assessment & Plan Note (Signed)
Decrease omeprazole to 20 mg qd X 4 weeks Then PRN

## 2014-10-30 NOTE — Assessment & Plan Note (Signed)
Cont current meds-discuss w/cardiology LE edema resolved since starting lisinopril/HCTZ Cardiology added cardizem twice daily, d/c norvasc

## 2014-10-30 NOTE — Assessment & Plan Note (Signed)
Increase zoloft to 50 mg qd Goal-fewer episodes of chest pain, no need for ativan F/u 10 wks

## 2014-10-30 NOTE — Progress Notes (Signed)
Pre visit review using our clinic review tool, if applicable. No additional management support is needed unless otherwise documented below in the visit note. 

## 2014-10-30 NOTE — Assessment & Plan Note (Signed)
lsot 4 lbs in 4 weeks! Cont diet changes & exercise

## 2014-10-30 NOTE — Progress Notes (Signed)
Subjective:     Sara Ramirez is a 34 y.o. female f/u anxiety, htn, LE edema, weight loss. Anxiety: fewer episodes of cp since starting zoloft. Used ativan  2or 3 times in last month-mostly when at work. Sleeping better. Discussed increasing dose to 50 mg qd for better control of symptoms.  Htn: well controlled. Low BP today. Cardiology d/c norvasc & started cardizem bid. Pt denies palpitations. Says LE edema has resolved since starting lisinopril/HCTZ. No SE from new meds. Will f/u with cardiology next week. Advised continue meds for now & discuss any changes w/cardio.  weight loss: lost additional 4 lbs in 4 weeks due to diet changes. Motivated & plans to continue with changes.   The following portions of the patient's history were reviewed and updated as appropriate: allergies, current medications, past medical history, past social history, past surgical history and problem list.  Review of Systems Pertinent items are noted in HPI.    Objective:    BP 104/65 mmHg  Pulse 95  Temp(Src) 97.7 F (36.5 C) (Temporal)  Resp 16  Ht 5' 3.5" (1.613 m)  Wt 271 lb (122.925 kg)  BMI 47.25 kg/m2  SpO2 98% General appearance: alert, cooperative, appears stated age and no distress Eyes: negative findings: lids and lashes normal and conjunctivae and sclerae normal Lungs: clear to auscultation bilaterally Heart: regular rate and rhythm, S1, S2 normal, no murmur, click, rub or gallop Extremities: extremities normal, atraumatic, no cyanosis or edema Pulses: 2+ and symmetric Neurologic: Grossly normal    Assessment:Plan   1. Indigestion Symptoms resolved decrease - omeprazole (PRILOSEC) 20 MG capsule; Take 1 capsule (20 mg total) by mouth daily.  Dispense: 30 capsule; Refill: 1  2. Situational anxiety Improved-less frequent Increase - sertraline (ZOLOFT) 50 MG tablet; Take 1 tablet (50 mg total) by mouth daily.  Dispense: 30 tablet; Refill: 2  3. BMI 45.0-49.9, adult Cont diet  changes  4. Essential hypertension Cont meds Discuss w/cardiology  F/u 10 weeks-anxiety

## 2014-11-06 ENCOUNTER — Encounter: Payer: Self-pay | Admitting: Cardiology

## 2014-11-06 ENCOUNTER — Ambulatory Visit (INDEPENDENT_AMBULATORY_CARE_PROVIDER_SITE_OTHER): Payer: BLUE CROSS/BLUE SHIELD | Admitting: Cardiology

## 2014-11-06 VITALS — BP 127/83 | HR 81 | Ht 63.0 in | Wt 272.0 lb

## 2014-11-06 DIAGNOSIS — R002 Palpitations: Secondary | ICD-10-CM | POA: Diagnosis not present

## 2014-11-06 DIAGNOSIS — R079 Chest pain, unspecified: Secondary | ICD-10-CM | POA: Diagnosis not present

## 2014-11-06 MED ORDER — HYDROCHLOROTHIAZIDE 12.5 MG PO CAPS: 12.5000 mg | ORAL_CAPSULE | Freq: Every day | ORAL | Status: DC

## 2014-11-06 NOTE — Patient Instructions (Signed)
   Stop Prinzide.  Begin HCTZ 12.5mg  daily - new sent to pharmacy today. Continue all other medications.   Your physician wants you to follow up in:  1 year.  You will receive a reminder letter in the mail one-two months in advance.  If you don't receive a letter, please call our office to schedule the follow up appointment

## 2014-11-06 NOTE — Progress Notes (Signed)
Patient ID: Sara Ramirez, female   DOB: [birth year], 34 y.o.   MRN: [id number]     Clinical Summary Sara Ramirez is a 34 y.o.female seen today for follow up of the following medical problems.   1.Palpitations/ Chest pain - prior workup at [facility] in 2013 with unremarkable echo, GxT, and holter monitor - reported ongoing palpitations last visit. Atenolol in the past she stated made her palpitations worst. Start dilt  bid, since that time symptoms significantly improved. She does note some occasionall blurry vision since starting dilt and questions if possible side effect.       Past Medical History  Diagnosis Date  . Hypertension   . Migraines   . Renal disorder     kidney stones  . Mitral regurgitation   . Tricuspid regurgitation   . Kidney stone      No Known Allergies   Current Outpatient Prescriptions  Medication Sig Dispense Refill  . diltiazem (CARDIZEM) 30 MG tablet Take 1 tablet (30 mg total) by mouth 2 (two) times daily. 180 tablet 0  . lisinopril-hydrochlorothiazide (PRINZIDE,ZESTORETIC) 10-12.5 MG per tablet Take 1 tablet by mouth daily. 30 tablet 2  . LORazepam (ATIVAN) 0.5 MG tablet Take 1 to 2 T PO qd PRN anxiety 30 tablet 1  . omeprazole (PRILOSEC) 20 MG capsule Take 1 capsule (20 mg total) by mouth daily. 30 capsule 1  . sertraline (ZOLOFT) 50 MG tablet Take 1 tablet (50 mg total) by mouth daily. 30 tablet 2   No current facility-administered medications for this visit.     Past Surgical History  Procedure Laterality Date  . Lithotripsy    . Cystoscopy w/ ureteral stent placement Right 01/03/2013    Procedure: CYSTOSCOPY WITH RIGHT RETROGRADE PYELOGRAM/ RIGHT URETERAL STENT PLACEMENT;  Surgeon: Martina Sinner, MD;  Location: [facility];  Service: Urology;  Laterality: Right;     No Known Allergies    Family History  Problem Relation Age of Onset  . Diabetes Father      Social History Sara Ramirez reports that she has never smoked.  She has never used smokeless tobacco. Sara Ramirez reports that she does not drink alcohol.   Review of Systems CONSTITUTIONAL: No weight loss, fever, chills, weakness or fatigue.  HEENT: blurry vision  SKIN: No rash or itching.  CARDIOVASCULAR: per HPI RESPIRATORY: No shortness of breath, cough or sputum.  GASTROINTESTINAL: No anorexia, nausea, vomiting or diarrhea. No abdominal pain or blood.  GENITOURINARY: No burning on urination, no polyuria NEUROLOGICAL: No headache, dizziness, syncope, paralysis, ataxia, numbness or tingling in the extremities. No change in bowel or bladder control.  MUSCULOSKELETAL: No muscle, back pain, joint pain or stiffness.  LYMPHATICS: No enlarged nodes. No history of splenectomy.  PSYCHIATRIC: No history of depression or anxiety.  ENDOCRINOLOGIC: No reports of sweating, cold or heat intolerance. No polyuria or polydipsia.  Marland Kitchen   Physical Examination Filed Vitals:   11/06/14 1011  BP: 127/83  Pulse: 81   Filed Vitals:   11/06/14 1011  Height:  (1.6 m)  Weight: 272 lb (123.378 kg)    Gen: resting comfortably, no acute distress HEENT: no scleral icterus, pupils equal round and reactive, no palptable cervical adenopathy,  CV: RRR, no m/r/g, no JVD Resp: Clear to auscultation bilaterally GI: abdomen is soft, non-tender, non-distended, normal bowel sounds, no hepatosplenomegaly MSK: extremities are warm, no edema.  Skin: warm, no rash Neuro:  no focal deficits Psych: appropriate affect   Diagnostic Studies 07/2011 echo LVEF  65%  07/2011 ETT: negative for ischemia, no arrhythmias   Assessment and Plan  1. Chest pain/palpitations - negative workup at [facility] in 2013 including echo, GXT, and cardiac monitor - symptoms improved on dilt, will continue current dose - note some occasional blurry vision, unclear cause. Will stop prinzide and restart HCTZ 12.5mg  daily in case related to low bp's since starting dilt.    F/u 1 year   Antoine Poche, M.D.

## 2015-01-08 ENCOUNTER — Ambulatory Visit: Payer: Self-pay | Admitting: Family Medicine

## 2015-01-08 DIAGNOSIS — Z0289 Encounter for other administrative examinations: Secondary | ICD-10-CM

## 2015-07-22 ENCOUNTER — Ambulatory Visit (INDEPENDENT_AMBULATORY_CARE_PROVIDER_SITE_OTHER): Payer: 59 | Admitting: Family Medicine

## 2015-07-22 ENCOUNTER — Encounter: Payer: Self-pay | Admitting: Family Medicine

## 2015-07-22 VITALS — BP 173/136 | HR 83 | Temp 98.2°F | Resp 20 | Ht 63.0 in | Wt 283.2 lb

## 2015-07-22 DIAGNOSIS — R002 Palpitations: Secondary | ICD-10-CM | POA: Insufficient documentation

## 2015-07-22 DIAGNOSIS — Z6841 Body Mass Index (BMI) 40.0 and over, adult: Secondary | ICD-10-CM

## 2015-07-22 DIAGNOSIS — G43009 Migraine without aura, not intractable, without status migrainosus: Secondary | ICD-10-CM | POA: Diagnosis not present

## 2015-07-22 DIAGNOSIS — I10 Essential (primary) hypertension: Secondary | ICD-10-CM | POA: Insufficient documentation

## 2015-07-22 MED ORDER — KETOROLAC TROMETHAMINE 60 MG/2ML IM SOLN
60.0000 mg | Freq: Once | INTRAMUSCULAR | Status: AC
  Administered 2015-07-22: 60 mg via INTRAMUSCULAR

## 2015-07-22 MED ORDER — BUTALBITAL-APAP-CAFFEINE 50-325-40 MG PO TABS: 1.0000 | ORAL_TABLET | Freq: Two times a day (BID) | ORAL | Status: DC | PRN

## 2015-07-22 MED ORDER — HYDROCHLOROTHIAZIDE 12.5 MG PO CAPS: 12.5000 mg | ORAL_CAPSULE | Freq: Every day | ORAL | Status: DC

## 2015-07-22 MED ORDER — DILTIAZEM HCL 30 MG PO TABS: 30.0000 mg | ORAL_TABLET | Freq: Two times a day (BID) | ORAL | Status: DC

## 2015-07-22 NOTE — Patient Instructions (Signed)
Hypertension °Hypertension, commonly called high blood pressure, is when the force of blood pumping through your arteries is too strong. Your arteries are the blood vessels that carry blood from your heart throughout your body. A blood pressure reading consists of a higher number over a lower number, such as 110/72. The higher number (systolic) is the pressure inside your arteries when your heart pumps. The lower number (diastolic) is the pressure inside your arteries when your heart relaxes. Ideally you want your blood pressure below 120/80. °Hypertension forces your heart to work harder to pump blood. Your arteries may become narrow or stiff. Having untreated or uncontrolled hypertension can cause heart attack, stroke, kidney disease, and other problems. °RISK FACTORS °Some risk factors for high blood pressure are controllable. Others are not.  °Risk factors you cannot control include:  °· Race. You may be at higher risk if you are African American. °· Age. Risk increases with age. °· Gender. Men are at higher risk than women before age 45 years. After age 65, women are at higher risk than men. °Risk factors you can control include: °· Not getting enough exercise or physical activity. °· Being overweight. °· Getting too much fat, sugar, calories, or salt in your diet. °· Drinking too much alcohol. °SIGNS AND SYMPTOMS °Hypertension does not usually cause signs or symptoms. Extremely high blood pressure (hypertensive crisis) may cause headache, anxiety, shortness of breath, and nosebleed. °DIAGNOSIS °To check if you have hypertension, your health care provider will measure your blood pressure while you are seated, with your arm held at the level of your heart. It should be measured at least twice using the same arm. Certain conditions can cause a difference in blood pressure between your right and left arms. A blood pressure reading that is higher than normal on one occasion does not mean that you need treatment. If  it is not clear whether you have high blood pressure, you may be asked to return on a different day to have your blood pressure checked again. Or, you may be asked to monitor your blood pressure at home for 1 or more weeks. °TREATMENT °Treating high blood pressure includes making lifestyle changes and possibly taking medicine. Living a healthy lifestyle can help lower high blood pressure. You may need to change some of your habits. °Lifestyle changes may include: °· Following the DASH diet. This diet is high in fruits, vegetables, and whole grains. It is low in salt, red meat, and added sugars. °· Keep your sodium intake below 2,300 mg per day. °· Getting at least 30-45 minutes of aerobic exercise at least 4 times per week. °· Losing weight if necessary. °· Not smoking. °· Limiting alcoholic beverages. °· Learning ways to reduce stress. °Your health care provider may prescribe medicine if lifestyle changes are not enough to get your blood pressure under control, and if one of the following is true: °· You are 18-59 years of age and your systolic blood pressure is above 140. °· You are 60 years of age or older, and your systolic blood pressure is above 150. °· Your diastolic blood pressure is above 90. °· You have diabetes, and your systolic blood pressure is over 140 or your diastolic blood pressure is over 90. °· You have kidney disease and your blood pressure is above 140/90. °· You have heart disease and your blood pressure is above 140/90. °Your personal target blood pressure may vary depending on your medical conditions, your age, and other factors. °HOME CARE INSTRUCTIONS °·   Have your blood pressure rechecked as directed by your health care provider.   °· Take medicines only as directed by your health care provider. Follow the directions carefully. Blood pressure medicines must be taken as prescribed. The medicine does not work as well when you skip doses. Skipping doses also puts you at risk for  problems. °· Do not smoke.   °· Monitor your blood pressure at home as directed by your health care provider.  °SEEK MEDICAL CARE IF:  °· You think you are having a reaction to medicines taken. °· You have recurrent headaches or feel dizzy. °· You have swelling in your ankles. °· You have trouble with your vision. °SEEK IMMEDIATE MEDICAL CARE IF: °· You develop a severe headache or confusion. °· You have unusual weakness, numbness, or feel faint. °· You have severe chest or abdominal pain. °· You vomit repeatedly. °· You have trouble breathing. °MAKE SURE YOU:  °· Understand these instructions. °· Will watch your condition. °· Will get help right away if you are not doing well or get worse. °  °This information is not intended to replace advice given to you by your health care provider. Make sure you discuss any questions you have with your health care provider. °  °Document Released: 03/21/2005 Document Revised: 08/05/2014 Document Reviewed: 01/11/2013 °Elsevier Interactive Patient Education ©2016 [employer] ° °Migraine Headache °A migraine headache is an intense, throbbing pain on one or both sides of your head. A migraine can last for 30 minutes to several hours. °CAUSES  °The exact cause of a migraine headache is not always known. However, a migraine may be caused when nerves in the brain become irritated and release chemicals that cause inflammation. This causes pain. °Certain things may also trigger migraines, such as: °· Alcohol. °· Smoking. °· Stress. °· Menstruation. °· Aged cheeses. °· Foods or drinks that contain nitrates, glutamate, aspartame, or tyramine. °· Lack of sleep. °· Chocolate. °· Caffeine. °· Hunger. °· Physical exertion. °· Fatigue. °· Medicines used to treat chest pain (nitroglycerine), birth control pills, estrogen, and some blood pressure medicines. °SIGNS AND SYMPTOMS °· Pain on one or both sides of your head. °· Pulsating or throbbing pain. °· Severe pain that prevents daily  activities. °· Pain that is aggravated by any physical activity. °· Nausea, vomiting, or both. °· Dizziness. °· Pain with exposure to bright lights, loud noises, or activity. °· General sensitivity to bright lights, loud noises, or smells. °Before you get a migraine, you may get warning signs that a migraine is coming (aura). An aura may include: °· Seeing flashing lights. °· Seeing bright spots, halos, or zigzag lines. °· Having tunnel vision or blurred vision. °· Having feelings of numbness or tingling. °· Having trouble talking. °· Having muscle weakness. °DIAGNOSIS  °A migraine headache is often diagnosed based on: °· Symptoms. °· Physical exam. °· A CT scan or MRI of your head. These imaging tests cannot diagnose migraines, but they can help rule out other causes of headaches. °TREATMENT °Medicines may be given for pain and nausea. Medicines can also be given to help prevent recurrent migraines.  °HOME CARE INSTRUCTIONS °· Only take over-the-counter or prescription medicines for pain or discomfort as directed by your health care provider. The use of long-term narcotics is not recommended. °· Lie down in a dark, quiet room when you have a migraine. °· Keep a journal to find out what may trigger your migraine headaches. For example, write down: °¨ What you eat and drink. °¨ How   much sleep you get. °¨ Any change to your diet or medicines. °· Limit alcohol consumption. °· Quit smoking if you smoke. °· Get 7-9 hours of sleep, or as recommended by your health care provider. °· Limit stress. °· Keep lights dim if bright lights bother you and make your migraines worse. °SEEK IMMEDIATE MEDICAL CARE IF:  °· Your migraine becomes severe. °· You have a fever. °· You have a stiff neck. °· You have vision loss. °· You have muscular weakness or loss of muscle control. °· You start losing your balance or have trouble walking. °· You feel faint or pass out. °· You have severe symptoms that are different from your first  symptoms. °MAKE SURE YOU:  °· Understand these instructions. °· Will watch your condition. °· Will get help right away if you are not doing well or get worse. °  °This information is not intended to replace advice given to you by your health care provider. Make sure you discuss any questions you have with your health care provider. °  °Document Released: 03/21/2005 Document Revised: 04/11/2014 Document Reviewed: 11/26/2012 °Elsevier Interactive Patient Education ©2016 [employer] ° °

## 2015-07-22 NOTE — Progress Notes (Signed)
Patient ID: Sara Ramirez, female   DOB: 1980-12-21, 35 y.o.   MRN: [id number]    Sara Ramirez , Sep 21, 1980, 35 y.o., female MRN: [id number]  CC: Migraine Subjective: Pt presents for an acute OV with complaints of migraine affecting the left side of her head and eye. Patient is new to this provider. She has daily headaches, since she has been without medications for headache or hypertension. Associated symptoms include nausea and vomit. Pt has tried advil  to ease their symptoms.  Pt states her headaches can last 24 hours. Nothing helps her migraines except foricet. She has been on imitrex in the past, but it made her nauseated. Patient has been without her medications including medications for hypertension and she had lost her insurance. She has now new insurance, any refills on her hypertensive meds. Patient denies any chest pain, shortness breath, dizziness or lower extremity edema.   Allergies  Allergen Reactions  . Imitrex [Sumatriptan] Nausea Only   Social History  Substance Use Topics  . Smoking status: Never Smoker   . Smokeless tobacco: Never Used  . Alcohol Use: No   Past Medical History  Diagnosis Date  . Hypertension   . Migraines   . Renal disorder     kidney stones  . Mitral regurgitation   . Tricuspid regurgitation   . Kidney stone   . Anxiety    Past Surgical History  Procedure Laterality Date  . Lithotripsy    . Cystoscopy w/ ureteral stent placement Right 01/03/2013    Procedure: CYSTOSCOPY WITH RIGHT RETROGRADE PYELOGRAM/ RIGHT URETERAL STENT PLACEMENT;  Surgeon: Reece Packer, MD;  Location: [facility];  Service: Urology;  Laterality: Right;   Family History  Problem Relation Age of Onset  . Diabetes Father   . Heart disease Paternal Grandmother   . Stroke Paternal Grandmother   . Migraines Mother   . Migraines Son   . Diabetes Maternal Grandfather      Medication List       This list is accurate as of: 07/22/15  2:25 PM.  Always  use your most recent med list.               aspirin EC 81 MG tablet  Take 81 mg by mouth 2 (two) times daily. Reported on 07/22/2015     butalbital-acetaminophen-caffeine 50-325-40 MG tablet  Commonly known as:  FIORICET, ESGIC  Take 1 tablet by mouth 2 (two) times daily as needed for headache.     diltiazem 30 MG tablet  Commonly known as:  CARDIZEM  Take 1 tablet (30 mg total) by mouth 2 (two) times daily.     hydrochlorothiazide 12.5 MG capsule  Commonly known as:  MICROZIDE  Take 1 capsule (12.5 mg total) by mouth daily.     LORazepam 0.5 MG tablet  Commonly known as:  ATIVAN  Take 1 to 2 T PO qd PRN anxiety     omeprazole 20 MG capsule  Commonly known as:  PRILOSEC  Take 1 capsule (20 mg total) by mouth daily.     sertraline 50 MG tablet  Commonly known as:  ZOLOFT  Take 1 tablet (50 mg total) by mouth daily.         ROS: Negative, with the exception of above mentioned in HPI   Objective:  BP 173/136 mmHg  Pulse 83  Temp(Src) 98.2 F (36.8 C)  Resp 20  Ht 5' 3"  (1.6 m)  Wt 283 lb 4 oz (128.481 kg)  BMI 50.19 kg/m2  SpO2 95%  LMP 07/18/2015 Body mass index is 50.19 kg/(m^2). Gen: Afebrile. No acute distress. Nontoxic in appearance, well-developed, well-nourished, Caucasian female, obese. HENT: AT. [place]. Bilateral TM visualized and normal in appearance. MMM, no oral lesions. Bilateral nares without erythema or swelling. Throat without erythema or exudates. No cough or hoarseness on exam. Eyes:Pupils Equal Round Reactive to light, Extraocular movements intact,  Conjunctiva without redness, discharge or icterus. Neck/lymp/endocrine: Supple, no lymphadenopathy, no thyromegaly CV: RRR no murmur appreciated, trace edema, +2/4 P posterior tibialis pulses Chest: CTAB, no wheeze or crackles. Good air movement, normal resp effort.  Abd: Soft. Morbidly obese. NTND. BS present. No Masses palpated. No rebound or guarding.  MSK: Obvious deformities, erythema or joint  swelling. Full range of motion of all extremities. Skin: No rashes, purpura or petechiae.  Neuro: Normal gait. PERLA. EOMi. Alert. Oriented x3 Cranial nerves II through XII intact. Muscle strength 5/5 upper and lower extremity. DTRs equal bilaterally. Psych: Normal affect, dress and demeanor. Normal speech. Normal thought content and judgment..   Assessment/Plan: Sara Ramirez is a 35 y.o. female present for  OV for migraines and hypertension. Uncontrolled Hypertension/palpitations: Hypertension is uncontrolled, patient has been without medications for months. She has been seen by cardiology in the past and her last regimen was reviewed. He will be restarted on the prior regimen and need to follow-up in 2 days for blood pressure recheck.  - Patient encouraged to make an appointment with her eye doctor for formal funduscopic exam.  - diltiazem (CARDIZEM) 30 MG tablet; Take 1 tablet (30 mg total) by mouth 2 (two) times daily.  Dispense: 60 tablet; Refill: 0 - hydrochlorothiazide (MICROZIDE) 12.5 MG capsule; Take 1 capsule (12.5 mg total) by mouth daily.  Dispense: 30 capsule; Refill: 0   Migraine without aura: - Uncertain if migraines are secondary to her uncontrolled blood pressure. Will refill on pure set today, patient counseled on emergency/red flag symptoms and went to be seen immediately. Toradol IM today. - Consider neurology referral if migraines continue despite blood pressure control. - butalbital-acetaminophen-caffeine (FIORICET, ESGIC) 50-325-40 MG tablet; Take 1 tablet by mouth 2 (two) times daily as needed for headache.  Dispense: 14 tablet; Refill: 1 - TSH; Future - Sed Rate (ESR); Future - RPR; Future - Prolactin; Future - Comp Met (CMET); Future - CBC w/Diff; Future - C-reactive protein; Future - Vitamin B12; Future - Vitamin D (25 hydroxy); Future - butalbital-acetaminophen-caffeine (FIORICET, ESGIC) 50-325-40 MG tablet; Take 1 tablet by mouth 2 (two) times daily as  needed for headache.  Dispense: 14 tablet; Refill: 1 - ketorolac (TORADOL) injection 60 mg; Inject 2 mLs (60 mg total) into the muscle once.   Morbid obesity, unspecified obesity type (HCC)/BMI 50.0-59.9, adult ([place]) - Patient counseled on diet and exercise - TSH; Future - Comp Met (CMET); Future - CBC w/Diff; Future    f/u 2 days  Greater than 40 minutes was spent with patient, greater than 50% of that time was spent face-to-face with patient counseling and coordinating care.  electronically signed by:  Howard Pouch, DO  [place]

## 2015-07-23 ENCOUNTER — Other Ambulatory Visit (INDEPENDENT_AMBULATORY_CARE_PROVIDER_SITE_OTHER): Payer: 59

## 2015-07-23 DIAGNOSIS — G43009 Migraine without aura, not intractable, without status migrainosus: Secondary | ICD-10-CM | POA: Diagnosis not present

## 2015-07-23 DIAGNOSIS — Z6841 Body Mass Index (BMI) 40.0 and over, adult: Secondary | ICD-10-CM

## 2015-07-23 DIAGNOSIS — I10 Essential (primary) hypertension: Secondary | ICD-10-CM | POA: Diagnosis not present

## 2015-07-23 DIAGNOSIS — R002 Palpitations: Secondary | ICD-10-CM | POA: Diagnosis not present

## 2015-07-23 LAB — MICROALBUMIN / CREATININE URINE RATIO
Creatinine, Urine: 171 mg/dL (ref 20–320)
Microalb Creat Ratio: 19 mcg/mg creat (ref <30)
Microalb, Ur: 3.3 mg/dL

## 2015-07-24 ENCOUNTER — Encounter: Payer: Self-pay | Admitting: Family Medicine

## 2015-07-24 ENCOUNTER — Ambulatory Visit (INDEPENDENT_AMBULATORY_CARE_PROVIDER_SITE_OTHER): Payer: 59 | Admitting: Family Medicine

## 2015-07-24 VITALS — BP 170/135 | HR 89 | Temp 97.6°F | Resp 20 | Wt 283.0 lb

## 2015-07-24 DIAGNOSIS — Z6841 Body Mass Index (BMI) 40.0 and over, adult: Secondary | ICD-10-CM

## 2015-07-24 DIAGNOSIS — I10 Essential (primary) hypertension: Secondary | ICD-10-CM

## 2015-07-24 DIAGNOSIS — E538 Deficiency of other specified B group vitamins: Secondary | ICD-10-CM

## 2015-07-24 LAB — COMPREHENSIVE METABOLIC PANEL
ALK PHOS: 95 U/L (ref 39–117)
ALT: 35 U/L (ref 0–35)
AST: 25 U/L (ref 0–37)
Albumin: 4.1 g/dL (ref 3.5–5.2)
BUN: 16 mg/dL (ref 6–23)
CHLORIDE: 103 meq/L (ref 96–112)
CO2: 26 meq/L (ref 19–32)
Calcium: 9.4 mg/dL (ref 8.4–10.5)
Creatinine, Ser: 0.81 mg/dL (ref 0.40–1.20)
GFR: 86.85 mL/min (ref >60.00)
GLUCOSE: 88 mg/dL (ref 70–99)
POTASSIUM: 3.6 meq/L (ref 3.5–5.1)
SODIUM: 140 meq/L (ref 135–145)
TOTAL PROTEIN: 7.4 g/dL (ref 6.0–8.3)
Total Bilirubin: 0.7 mg/dL (ref 0.2–1.2)

## 2015-07-24 LAB — VITAMIN B12: Vitamin B-12: 144 pg/mL — ABNORMAL LOW (ref 211–911)

## 2015-07-24 LAB — CBC WITH DIFFERENTIAL/PLATELET
BASOS PCT: 0.5 % (ref 0.0–3.0)
Basophils Absolute: 0 10*3/uL (ref 0.0–0.1)
EOS PCT: 2.2 % (ref 0.0–5.0)
Eosinophils Absolute: 0.2 10*3/uL (ref 0.0–0.7)
HCT: 37.8 % (ref 36.0–46.0)
Hemoglobin: 12.9 g/dL (ref 12.0–15.0)
LYMPHS ABS: 2.1 10*3/uL (ref 0.7–4.0)
Lymphocytes Relative: 25.5 % (ref 12.0–46.0)
MCHC: 34 g/dL (ref 30.0–36.0)
MCV: 91.9 fl (ref 78.0–100.0)
MONO ABS: 0.5 10*3/uL (ref 0.1–1.0)
Monocytes Relative: 6 % (ref 3.0–12.0)
NEUTROS ABS: 5.4 10*3/uL (ref 1.4–7.7)
NEUTROS PCT: 65.8 % (ref 43.0–77.0)
PLATELETS: 339 10*3/uL (ref 150.0–400.0)
RBC: 4.11 Mil/uL (ref 3.87–5.11)
RDW: 14.3 % (ref 11.5–15.5)
WBC: 8.2 10*3/uL (ref 4.0–10.5)

## 2015-07-24 LAB — TSH: TSH: 1.28 u[IU]/mL (ref 0.35–4.50)

## 2015-07-24 LAB — VITAMIN D 25 HYDROXY (VIT D DEFICIENCY, FRACTURES): VITD: 31.83 ng/mL (ref 30.00–100.00)

## 2015-07-24 LAB — C-REACTIVE PROTEIN: CRP: 5.3 mg/dL (ref 0.5–20.0)

## 2015-07-24 LAB — RPR

## 2015-07-24 LAB — SEDIMENTATION RATE: Sed Rate: 28 mm/hr — ABNORMAL HIGH (ref 0–22)

## 2015-07-24 LAB — PROLACTIN: PROLACTIN: 8.5 ng/mL

## 2015-07-24 MED ORDER — VITAMIN B-12 1000 MCG PO TABS: ORAL_TABLET | ORAL | Status: DC

## 2015-07-24 MED ORDER — LISINOPRIL-HYDROCHLOROTHIAZIDE 10-12.5 MG PO TABS: 1.0000 | ORAL_TABLET | Freq: Every day | ORAL | Status: DC

## 2015-07-24 NOTE — Progress Notes (Signed)
Patient ID: Sara Ramirez, female   DOB: Aug 25, 1980, 35 y.o.   MRN: [id number]    Sara Ramirez , [birth year], 35 y.o., female MRN: [id number]  CC: HTN/migraine follow up Subjective:   Uncontrolled HTN: Pt reports taking the dilt 30 mg BID for last few days since ordered, with the HCTZ 12.5. This was her prior regimen. Her BP today is basically unchanged, but she states she is urinating more and feels better. Patient has also been on lisinopril 10 mg in the past, but was taken off that medication for lower end BP by cardiology. Reviewed labs with pt today collected at last visit. Pt denies chest pain , shortness of breath, dizziness or syncope. She does not exercise or watch her diet closely.   Migraine: Her migraine has resolved. She was given a Toradol shot in the office at her  Last visit and headache resolved. She has needed to take the Fioricet. Pt has a low b12 and low normal vit D. RPR,  Prolactin normal. ESR mildly elevated with normal CRP. TSH normal.    Prior note:  Pt presents for an acute OV with complaints of migraine affecting the left side of her head and eye. Patient is new to this provider. She has daily headaches, since she has been without medications for headache or hypertension. Associated symptoms include nausea and vomit. Pt has tried advil  to ease their symptoms.  Pt states her headaches can last 24 hours. Nothing helps her migraines except foricet. She has been on imitrex in the past, but it made her nauseated. Patient has been without her medications including medications for hypertension and she had lost her insurance. She has now new insurance, any refills on her hypertensive meds. Patient denies any chest pain, shortness breath, dizziness or lower extremity edema.  Results for orders placed or performed in visit on 07/23/15 (from the past 72 hour(s))  TSH     Status: None   Collection Time: 07/23/15  3:55 PM  Result Value Ref Range   TSH 1.28 0.35 -  4.50 uIU/mL  Sed Rate (ESR)     Status: Abnormal   Collection Time: 07/23/15  3:55 PM  Result Value Ref Range   Sed Rate 28 (H) 0 - 22 mm/hr  RPR     Status: None   Collection Time: 07/23/15  3:55 PM  Result Value Ref Range   RPR Ser Ql NON REAC NON REAC  Prolactin     Status: None   Collection Time: 07/23/15  3:55 PM  Result Value Ref Range   Prolactin 8.5 ng/mL    Comment: Reference Range Adult Female  Non-Pregnant 3.0-30.0 ng/mL  Pregnant 10.0-209.0 ng/mL  Postmenopausal 2.0-20.0 ng/mL     Comp Met (CMET)     Status: None   Collection Time: 07/23/15  3:55 PM  Result Value Ref Range   Sodium 140 135 - 145 mEq/L   Potassium 3.6 3.5 - 5.1 mEq/L   Chloride 103 96 - 112 mEq/L   CO2 26 19 - 32 mEq/L   Glucose, Bld 88 70 - 99 mg/dL   BUN 16 6 - 23 mg/dL   Creatinine, Ser 0.81 0.40 - 1.20 mg/dL   Total Bilirubin 0.7 0.2 - 1.2 mg/dL   Alkaline Phosphatase 95 39 - 117 U/L   AST 25 0 - 37 U/L   ALT 35 0 - 35 U/L   Total Protein 7.4 6.0 - 8.3 g/dL   Albumin 4.1 3.5 - 5.2 g/dL  Calcium 9.4 8.4 - 10.5 mg/dL   GFR 86.85 >60.00 mL/min  CBC w/Diff     Status: None   Collection Time: 07/23/15  3:55 PM  Result Value Ref Range   WBC 8.2 4.0 - 10.5 K/uL   RBC 4.11 3.87 - 5.11 Mil/uL   Hemoglobin 12.9 12.0 - 15.0 g/dL   HCT 37.8 36.0 - 46.0 %   MCV 91.9 78.0 - 100.0 fl   MCHC 34.0 30.0 - 36.0 g/dL   RDW 14.3 11.5 - 15.5 %   Platelets 339.0 150.0 - 400.0 K/uL   Neutrophils Relative % 65.8 43.0 - 77.0 %   Lymphocytes Relative 25.5 12.0 - 46.0 %   Monocytes Relative 6.0 3.0 - 12.0 %   Eosinophils Relative 2.2 0.0 - 5.0 %   Basophils Relative 0.5 0.0 - 3.0 %   Neutro Abs 5.4 1.4 - 7.7 K/uL   Lymphs Abs 2.1 0.7 - 4.0 K/uL   Monocytes Absolute 0.5 0.1 - 1.0 K/uL   Eosinophils Absolute 0.2 0.0 - 0.7 K/uL   Basophils Absolute 0.0 0.0 - 0.1 K/uL  C-reactive protein     Status: None   Collection Time: 07/23/15  3:55 PM  Result Value Ref Range   CRP 5.3 0.5 - 20.0 mg/dL  Vitamin B12      Status: Abnormal   Collection Time: 07/23/15  3:55 PM  Result Value Ref Range   Vitamin B-12 144 (L) 211 - 911 pg/mL  Vitamin D (25 hydroxy)     Status: None   Collection Time: 07/23/15  3:55 PM  Result Value Ref Range   VITD 31.83 30.00 - 100.00 ng/mL     Allergies  Allergen Reactions  . Imitrex [Sumatriptan] Nausea Only   Social History  Substance Use Topics  . Smoking status: Never Smoker   . Smokeless tobacco: Never Used  . Alcohol Use: No   Past Medical History  Diagnosis Date  . Hypertension   . Migraines   . Renal disorder     kidney stones  . Mitral regurgitation   . Tricuspid regurgitation   . Kidney stone   . Anxiety    Past Surgical History  Procedure Laterality Date  . Lithotripsy    . Cystoscopy w/ ureteral stent placement Right 01/03/2013    Procedure: CYSTOSCOPY WITH RIGHT RETROGRADE PYELOGRAM/ RIGHT URETERAL STENT PLACEMENT;  Surgeon: Reece Packer, MD;  Location: [facility];  Service: Urology;  Laterality: Right;   Family History  Problem Relation Age of Onset  . Diabetes Father   . Heart disease Paternal Grandmother   . Stroke Paternal Grandmother   . Migraines Mother   . Migraines Son   . Diabetes Maternal Grandfather      Medication List       This list is accurate as of: 07/24/15  3:51 PM.  Always use your most recent med list.               aspirin EC 81 MG tablet  Take 81 mg by mouth 2 (two) times daily. Reported on 07/22/2015     butalbital-acetaminophen-caffeine 50-325-40 MG tablet  Commonly known as:  FIORICET, ESGIC  Take 1 tablet by mouth 2 (two) times daily as needed for headache.     diltiazem 30 MG tablet  Commonly known as:  CARDIZEM  Take 1 tablet (30 mg total) by mouth 2 (two) times daily.     hydrochlorothiazide 12.5 MG capsule  Commonly known as:  MICROZIDE  Take 1  capsule (12.5 mg total) by mouth daily.     LORazepam 0.5 MG tablet  Commonly known as:  ATIVAN  Take 1 to 2 T PO qd PRN anxiety      omeprazole 20 MG capsule  Commonly known as:  PRILOSEC  Take 1 capsule (20 mg total) by mouth daily.     sertraline 50 MG tablet  Commonly known as:  ZOLOFT  Take 1 tablet (50 mg total) by mouth daily.        ROS: Negative, with the exception of above mentioned in HPI  Objective:  BP 170/135 mmHg  Pulse 89  Temp(Src) 97.6 F (36.4 C) (Oral)  Resp 20  Wt 283 lb (128.368 kg)  SpO2 97%  LMP 07/18/2015 Body mass index is 50.14 kg/(m^2). Gen: Afebrile. No acute distress. Nontoxic in appearance, well-developed, well-nourished, Caucasian female, obese. HENT: AT. [place]. MMM, no oral lesions.  Eyes:Pupils Equal Round Reactive to light, Extraocular movements intact,  Conjunctiva without redness, discharge or icterus. Neck/lymp/endocrine: Supple, no lymphadenopathy, no thyromegaly CV: RRR no murmur appreciated, trace edema, +2/4 P posterior tibialis pulses Chest: CTAB, no wheeze or crackles. Good air movement, normal resp effort.  Neuro: Normal gait. PERLA. EOMi. Alert. Oriented x3  Psych: Normal affect, dress and demeanor. Normal speech. Normal thought content and judgment..   Assessment/Plan: KALEEYAH CUFFIE is a 35 y.o. female present for  Follow up OV for migraines and hypertension. Uncontrolled Hypertension/palpitations: Hypertension is still uncontrolled, despite being restarted on prior regimen.  - Start lisinopril 10 mg/HCTZ 12.5 - Continue HCTZ 12.5--> once able to adjust all meds will prescribe combo pill - Low salt diet - exercise > 150 minutes a week - Patient encouraged to make an appointment with her eye doctor for formal funduscopic exam.  - lisinopril-hydrochlorothiazide (PRINZIDE,ZESTORETIC) 10-12.5 MG tablet; Take 1 tablet by mouth daily.  Dispense: 30 tablet; Refill: 0  B12 deficiency - vitamin B-12 (CYANOCOBALAMIN) 1000 MCG tablet; 1000 mcg daily for 7 days and then 1000 mcg weekly  Dispense: 18 tablet; Refill: 0  BMI 50.0-59.9, adult (HCC)/Morbid obesity,  unspecified obesity type ([place]) - Diet and exercise encouraged.   - Vit d low normal, encouraged pt to take 800 u Vit D daily with meal.   F/U nurse visit 5 days for BP recheck  > 25 minutes spent with patient, >50% of time spent face to face counseling patient and coordinating care.   electronically signed by:  Howard Pouch, DO  [place]

## 2015-07-24 NOTE — Patient Instructions (Signed)
30 mg diltiazem two times a day continued 12.5 hctz continued Lisinopril HCTZ started b12 called in to pharmacy, will need to retest in 8-12 weeks vit D 800 u a day with meal (pick up OTC) Low sodium diet, exercise > 150 m a week.   Low-Sodium Eating Plan Sodium raises blood pressure and causes water to be held in the body. Getting less sodium from food will help lower your blood pressure, reduce any swelling, and protect your heart, liver, and kidneys. We get sodium by adding salt (sodium chloride) to food. Most of our sodium comes from canned, boxed, and frozen foods. Restaurant foods, fast foods, and pizza are also very high in sodium. Even if you take medicine to lower your blood pressure or to reduce fluid in your body, getting less sodium from your food is important. WHAT IS MY PLAN? Most people should limit their sodium intake to 2,300 mg a day. Your health care provider recommends that you limit your sodium intake to ______2000____ a day.  WHAT DO I NEED TO KNOW ABOUT THIS EATING PLAN? For the low-sodium eating plan, you will follow these general guidelines:  Choose foods with a % Daily Value for sodium of less than 5% (as listed on the food label).   Use salt-free seasonings or herbs instead of table salt or sea salt.   Check with your health care provider or pharmacist before using salt substitutes.   Eat fresh foods.  Eat more vegetables and fruits.  Limit canned vegetables. If you do use them, rinse them well to decrease the sodium.   Limit cheese to 1 oz (28 g) per day.   Eat lower-sodium products, often labeled as "lower sodium" or "no salt added."  Avoid foods that contain monosodium glutamate (MSG). MSG is sometimes added to [place] food and some canned foods.  Check food labels (Nutrition Facts labels) on foods to learn how much sodium is in one serving.  Eat more home-cooked food and less restaurant, buffet, and fast food.  When eating at a restaurant,  ask that your food be prepared with less salt, or no salt if possible.  HOW DO I READ FOOD LABELS FOR SODIUM INFORMATION? The Nutrition Facts label lists the amount of sodium in one serving of the food. If you eat more than one serving, you must multiply the listed amount of sodium by the number of servings. Food labels may also identify foods as:  Sodium free--Less than 5 mg in a serving.  Very low sodium--35 mg or less in a serving.  Low sodium--140 mg or less in a serving.  Light in sodium--50% less sodium in a serving. For example, if a food that usually has 300 mg of sodium is changed to become light in sodium, it will have 150 mg of sodium.  Reduced sodium--25% less sodium in a serving. For example, if a food that usually has 400 mg of sodium is changed to reduced sodium, it will have 300 mg of sodium. WHAT FOODS CAN I EAT? Grains Low-sodium cereals, including oats, puffed wheat and rice, and shredded wheat cereals. Low-sodium crackers. Unsalted rice and pasta. Lower-sodium bread.  Vegetables Frozen or fresh vegetables. Low-sodium or reduced-sodium canned vegetables. Low-sodium or reduced-sodium tomato sauce and paste. Low-sodium or reduced-sodium tomato and vegetable juices.  Fruits Fresh, frozen, and canned fruit. Fruit juice.  Meat and Other Protein Products Low-sodium canned tuna and salmon. Fresh or frozen meat, poultry, seafood, and fish. Lamb. Unsalted nuts. Dried beans, peas, and lentils  without added salt. Unsalted canned beans. Homemade soups without salt. Eggs.  Dairy Milk. Soy milk. Ricotta cheese. Low-sodium or reduced-sodium cheeses. Yogurt.  Condiments Fresh and dried herbs and spices. Salt-free seasonings. Onion and garlic powders. Low-sodium varieties of mustard and ketchup. Fresh or refrigerated horseradish. Lemon juice.  Fats and Oils Reduced-sodium salad dressings. Unsalted butter.  Other Unsalted popcorn and pretzels.  The items listed above may  not be a complete list of recommended foods or beverages. Contact your dietitian for more options. WHAT FOODS ARE NOT RECOMMENDED? Grains Instant hot cereals. Bread stuffing, pancake, and biscuit mixes. Croutons. Seasoned rice or pasta mixes. Noodle soup cups. Boxed or frozen macaroni and cheese. Self-rising flour. Regular salted crackers. Vegetables Regular canned vegetables. Regular canned tomato sauce and paste. Regular tomato and vegetable juices. Frozen vegetables in sauces. Salted [place] fries. Olives. Rosita FirePickles. Relishes. Sauerkraut. Salsa. Meat and Other Protein Products Salted, canned, smoked, spiced, or pickled meats, seafood, or fish. Bacon, ham, sausage, hot dogs, corned beef, chipped beef, and packaged luncheon meats. Salt pork. Jerky. Pickled herring. Anchovies, regular canned tuna, and sardines. Salted nuts. Dairy Processed cheese and cheese spreads. Cheese curds. Blue cheese and cottage cheese. Buttermilk.  Condiments Onion and garlic salt, seasoned salt, table salt, and sea salt. Canned and packaged gravies. Worcestershire sauce. Tartar sauce. Barbecue sauce. Teriyaki sauce. Soy sauce, including reduced sodium. Steak sauce. Fish sauce. Oyster sauce. Cocktail sauce. Horseradish that you find on the shelf. Regular ketchup and mustard. Meat flavorings and tenderizers. Bouillon cubes. Hot sauce. Tabasco sauce. Marinades. Taco seasonings. Relishes. Fats and Oils Regular salad dressings. Salted butter. Margarine. Ghee. Bacon fat.  Other Potato and tortilla chips. Corn chips and puffs. Salted popcorn and pretzels. Canned or dried soups. Pizza. Frozen entrees and pot pies.  The items listed above may not be a complete list of foods and beverages to avoid. Contact your dietitian for more information.   This information is not intended to replace advice given to you by your health care provider. Make sure you discuss any questions you have with your health care provider.   Document  Released: 09/10/2001 Document Revised: 04/11/2014 Document Reviewed: 01/23/2013 Elsevier Interactive Patient Education [employer] Inc.

## 2015-07-27 ENCOUNTER — Encounter: Payer: Self-pay | Admitting: Family Medicine

## 2015-07-27 ENCOUNTER — Telehealth: Payer: Self-pay | Admitting: Family Medicine

## 2015-07-27 NOTE — Telephone Encounter (Signed)
Patient Name: Sara GermanJERRILYN Ramirez  DOB: [birth year]    Initial Comment caller states she saw the dr last week - this am is having back pain again - is having chest pain and pressure also   Nurse Assessment  Nurse: Deatra JamesNoe, RN, Corrie DandyMary Date/Time (Eastern Time): 07/27/2015 9:03:31 AM  Confirm and document reason for call. If symptomatic, describe symptoms. You must click the next button to save text entered. ---Patient states she was seen last week and she had back pain and her bp was elevated. This morning the back pain is bad and she is having chest pain with pressure  Has the patient traveled out of the country within the last 30 days? ---No  Does the patient have any new or worsening symptoms? ---Yes  Will a triage be completed? ---Yes  Related visit to physician within the last 2 weeks? ---Yes  Does the PT have any chronic conditions? (i.e. diabetes, asthma, etc.) ---Yes  List chronic conditions. ---"HTN, kidney problems, Leaky heart valve"  Is the patient pregnant or possibly pregnant? (Ask all females between the ages of 5612-55) ---No  Is this a behavioral health or substance abuse call? ---No     Guidelines    Guideline Title Affirmed Question Affirmed Notes  Chest Pain [1] Chest pain lasts > 5 minutes AND [2] history of heart disease (i.e., heart attack, bypass surgery, angina, angioplasty, CHF; not just a heart murmur)    Final Disposition User   Call EMS 911 Now Deatra JamesNoe, RN, [facility]    [facility]   Disagree/Comply: Disagree  Disagree/Comply Reason: Disagree with instructions   [facility]

## 2015-07-27 NOTE — Telephone Encounter (Signed)
Dr Kuneff aware and agrees with disposition. 

## 2015-07-29 ENCOUNTER — Encounter: Payer: Self-pay | Admitting: Family Medicine

## 2015-07-29 ENCOUNTER — Ambulatory Visit (INDEPENDENT_AMBULATORY_CARE_PROVIDER_SITE_OTHER): Payer: 59 | Admitting: Family Medicine

## 2015-07-29 VITALS — BP 120/64 | HR 103

## 2015-07-29 DIAGNOSIS — I10 Essential (primary) hypertension: Secondary | ICD-10-CM

## 2015-07-29 MED ORDER — HYDROCHLOROTHIAZIDE 25 MG PO TABS: 25.0000 mg | ORAL_TABLET | Freq: Every day | ORAL | Status: DC

## 2015-07-29 MED ORDER — LISINOPRIL 10 MG PO TABS: 10.0000 mg | ORAL_TABLET | Freq: Every day | ORAL | Status: DC

## 2015-07-29 NOTE — Progress Notes (Signed)
Patient ID: Elby ShowersJerrilynn N Hernandez, female   DOB: Jul 30, 1980, 35 y.o.   MRN: [id number]  Repeat BP in office today is perfect at 120/64. Pt instructed by this provider to continue the lisinopril 10 and HCTZ 25 mg total. She can use the medication she has at home and upon refill will she will have to still take 2 pills unfortunately because the combo pill dose not come in the dosages we need. Please make pt aware since I told her we may be able to get her one pill, so she is not confused.

## 2015-07-29 NOTE — Progress Notes (Signed)
Pt presents to office today for blood pressure check. She was last seen by Dr. Claiborne BillingsKuneff on 07/24/15 and was advised to come back in 5 days for blood pressure check. Pt stated that she has been taking the lisinopril/hctz, cardizem and hctz. Note sent to Dr. Claiborne BillingsKuneff for review.

## 2015-08-03 ENCOUNTER — Encounter: Payer: Self-pay | Admitting: Family Medicine

## 2015-08-03 ENCOUNTER — Emergency Department (HOSPITAL_COMMUNITY): Payer: 59

## 2015-08-03 ENCOUNTER — Emergency Department (HOSPITAL_COMMUNITY)
Admission: EM | Admit: 2015-08-03 | Discharge: 2015-08-03 | Disposition: A | Discharge status: Home / Self Care | Payer: 59 | Attending: Emergency Medicine | Admitting: Emergency Medicine

## 2015-08-03 ENCOUNTER — Encounter (HOSPITAL_COMMUNITY): Payer: Self-pay | Admitting: Emergency Medicine

## 2015-08-03 DIAGNOSIS — Z79899 Other long term (current) drug therapy: Secondary | ICD-10-CM | POA: Insufficient documentation

## 2015-08-03 DIAGNOSIS — R1013 Epigastric pain: Secondary | ICD-10-CM | POA: Diagnosis present

## 2015-08-03 DIAGNOSIS — I10 Essential (primary) hypertension: Secondary | ICD-10-CM | POA: Insufficient documentation

## 2015-08-03 DIAGNOSIS — Z7982 Long term (current) use of aspirin: Secondary | ICD-10-CM | POA: Insufficient documentation

## 2015-08-03 DIAGNOSIS — K2100 Gastro-esophageal reflux disease with esophagitis, without bleeding: Secondary | ICD-10-CM

## 2015-08-03 DIAGNOSIS — K2970 Gastritis, unspecified, without bleeding: Secondary | ICD-10-CM | POA: Insufficient documentation

## 2015-08-03 LAB — COMPREHENSIVE METABOLIC PANEL
ALK PHOS: 101 U/L (ref 38–126)
ALT: 29 U/L (ref 14–54)
ANION GAP: 10 (ref 5–15)
AST: 26 U/L (ref 15–41)
Albumin: 4.3 g/dL (ref 3.5–5.0)
BILIRUBIN TOTAL: 0.8 mg/dL (ref 0.3–1.2)
BUN: 20 mg/dL (ref 6–20)
CALCIUM: 9.3 mg/dL (ref 8.9–10.3)
CO2: 26 mmol/L (ref 22–32)
Chloride: 101 mmol/L (ref 101–111)
Creatinine, Ser: 0.93 mg/dL (ref 0.44–1.00)
Glucose, Bld: 114 mg/dL — ABNORMAL HIGH (ref 65–99)
POTASSIUM: 3.8 mmol/L (ref 3.5–5.1)
Sodium: 137 mmol/L (ref 135–145)
TOTAL PROTEIN: 8.1 g/dL (ref 6.5–8.1)

## 2015-08-03 LAB — CBC
HCT: 42.1 % (ref 36.0–46.0)
HEMOGLOBIN: 14.2 g/dL (ref 12.0–15.0)
MCH: 31.6 pg (ref 26.0–34.0)
MCHC: 33.7 g/dL (ref 30.0–36.0)
MCV: 93.6 fL (ref 78.0–100.0)
Platelets: 388 10*3/uL (ref 150–400)
RBC: 4.5 MIL/uL (ref 3.87–5.11)
RDW: 13.7 % (ref 11.5–15.5)
WBC: 18.7 10*3/uL — AB (ref 4.0–10.5)

## 2015-08-03 LAB — URINALYSIS, ROUTINE W REFLEX MICROSCOPIC
BILIRUBIN URINE: NEGATIVE
Glucose, UA: NEGATIVE mg/dL
Hgb urine dipstick: NEGATIVE
Ketones, ur: NEGATIVE mg/dL
LEUKOCYTES UA: NEGATIVE
NITRITE: NEGATIVE
PH: 6.5 (ref 5.0–8.0)
Protein, ur: NEGATIVE mg/dL
SPECIFIC GRAVITY, URINE: 1.02 (ref 1.005–1.030)

## 2015-08-03 LAB — LIPASE, BLOOD: Lipase: 26 U/L (ref 11–51)

## 2015-08-03 LAB — PREGNANCY, URINE: PREG TEST UR: NEGATIVE

## 2015-08-03 MED ORDER — MORPHINE SULFATE (PF) 4 MG/ML IV SOLN
4.0000 mg | Freq: Once | INTRAVENOUS | Status: AC
  Administered 2015-08-03: 4 mg via INTRAVENOUS
  Filled 2015-08-03: qty 1

## 2015-08-03 MED ORDER — PANTOPRAZOLE SODIUM 40 MG IV SOLR
40.0000 mg | Freq: Once | INTRAVENOUS | Status: AC
  Administered 2015-08-03: 40 mg via INTRAVENOUS
  Filled 2015-08-03: qty 40

## 2015-08-03 MED ORDER — HYDROCODONE-ACETAMINOPHEN 5-325 MG PO TABS: 1.0000 | ORAL_TABLET | ORAL | Status: DC | PRN

## 2015-08-03 MED ORDER — SODIUM CHLORIDE 0.9 % IV BOLUS (SEPSIS)
1000.0000 mL | Freq: Once | INTRAVENOUS | Status: AC
  Administered 2015-08-03: 1000 mL via INTRAVENOUS

## 2015-08-03 MED ORDER — OMEPRAZOLE 20 MG PO CPDR: 20.0000 mg | DELAYED_RELEASE_CAPSULE | Freq: Two times a day (BID) | ORAL | Status: DC

## 2015-08-03 MED ORDER — ONDANSETRON 4 MG PO TBDP: 4.0000 mg | ORAL_TABLET | Freq: Three times a day (TID) | ORAL | Status: DC | PRN

## 2015-08-03 MED ORDER — ONDANSETRON HCL 4 MG/2ML IJ SOLN
4.0000 mg | Freq: Once | INTRAMUSCULAR | Status: AC
  Administered 2015-08-03: 4 mg via INTRAVENOUS
  Filled 2015-08-03: qty 2

## 2015-08-03 NOTE — ED Provider Notes (Signed)
CSN: [id number]     Arrival date & time 08/03/15  1639 History   First MD Initiated Contact with Patient 08/03/15 2032     Chief Complaint  Patient presents with  . Abdominal Pain      HPI   Patient presents for evaluation of abdominal pain and vomiting. States a week ago she had an anxiety attack and had chest pain. States she was seen at [facility]. She states "they gave me Toradol may be vomit all night". Chest vomiting Monday night. It was one week ago. Today is Monday. She's had some abdominal discomfort in her epigastrium since that time. Today she started vomiting again this morning states it made her chest and abdomen hurts she presents here.  Call or tobacco. Does not take anti-inflammatories. She states she drinks "at least 2 or 3 [facility] dews per day.  Nausea recent noticeable food intolerances. No change in color character or stools or urine. Negative nonbilious emesis.  Past Medical History  Diagnosis Date  . Hypertension   . Migraines   . Renal disorder     kidney stones  . Mitral regurgitation   . Tricuspid regurgitation   . Kidney stone   . Anxiety    Past Surgical History  Procedure Laterality Date  . Lithotripsy    . Cystoscopy w/ ureteral stent placement Right 01/03/2013    Procedure: CYSTOSCOPY WITH RIGHT RETROGRADE PYELOGRAM/ RIGHT URETERAL STENT PLACEMENT;  Surgeon: Martina Sinner, MD;  Location: [facility];  Service: Urology;  Laterality: Right;   Family History  Problem Relation Age of Onset  . Diabetes Father   . Heart disease Paternal Grandmother   . Stroke Paternal Grandmother   . Migraines Mother   . Migraines Son   . Diabetes Maternal Grandfather    Social History  Substance Use Topics  . Smoking status: Never Smoker   . Smokeless tobacco: Never Used  . Alcohol Use: No   OB History    Gravida Para Term Preterm AB TAB SAB Ectopic Multiple Living   1         1     Review of Systems  Constitutional: Negative for fever,  chills, diaphoresis, appetite change and fatigue.  HENT: Negative for mouth sores, sore throat and trouble swallowing.   Eyes: Negative for visual disturbance.  Respiratory: Negative for cough, chest tightness, shortness of breath and wheezing.   Cardiovascular: Negative for chest pain.  Gastrointestinal: Positive for nausea, vomiting and abdominal pain. Negative for diarrhea and abdominal distention.  Endocrine: Negative for polydipsia, polyphagia and polyuria.  Genitourinary: Negative for dysuria, frequency and hematuria.  Musculoskeletal: Negative for gait problem.  Skin: Negative for color change, pallor and rash.  Neurological: Negative for dizziness, syncope, light-headedness and headaches.  Hematological: Does not bruise/bleed easily.  Psychiatric/Behavioral: Negative for behavioral problems and confusion.      Allergies  Imitrex and Toradol  Home Medications   Prior to Admission medications   Medication Sig Start Date End Date Taking? Authorizing Provider  aspirin EC 81 MG tablet Take 81 mg by mouth 2 (two) times daily. Reported on 07/22/2015   Yes Historical Provider, MD  butalbital-acetaminophen-caffeine (FIORICET, ESGIC) 50-325-40 MG tablet Take 1 tablet by mouth 2 (two) times daily as needed for headache. 07/22/15  Yes Renee A Kuneff, DO  cholecalciferol (VITAMIN D) 1000 units tablet Take 1,000 Units by mouth daily.   Yes Historical Provider, MD  diltiazem (CARDIZEM) 30 MG tablet Take 1 tablet (30 mg total) by  mouth 2 (two) times daily. 07/22/15  Yes Renee A Kuneff, DO  hydrochlorothiazide (HYDRODIURIL) 25 MG tablet Take 1 tablet (25 mg total) by mouth daily. 07/29/15  Yes Renee A Kuneff, DO  lisinopril-hydrochlorothiazide (PRINZIDE,ZESTORETIC) 10-12.5 MG tablet Take 1 tablet by mouth daily. 07/24/15  Yes Renee A Kuneff, DO  LORazepam (ATIVAN) 0.5 MG tablet Take 1 to 2 T PO qd PRN anxiety Patient taking differently: Take 0.5-1 mg by mouth daily as needed for anxiety. Take 1 to 2  T PO qd PRN anxiety 10/21/14  Yes Kelle DartingLayne C Weaver, NP  vitamin B-12 (CYANOCOBALAMIN) 1000 MCG tablet 1000 mcg daily for 7 days and then 1000 mcg weekly Patient taking differently: Take 1,000 mcg by mouth daily. 1000 mcg daily for 7 days and then 1000 mcg weekly 07/24/15  Yes Renee A Kuneff, DO  HYDROcodone-acetaminophen (NORCO/VICODIN) 5-325 MG tablet Take 1 tablet by mouth every 4 (four) hours as needed. 08/03/15   Rolland PorterMark Maryl Blalock, MD  lisinopril (PRINIVIL,ZESTRIL) 10 MG tablet Take 1 tablet (10 mg total) by mouth daily. Patient not taking: Reported on 08/03/2015 07/29/15   Renee A Kuneff, DO  omeprazole (PRILOSEC) 20 MG capsule Take 1 capsule (20 mg total) by mouth 2 (two) times daily. 08/03/15   Rolland PorterMark Annarose Ouellet, MD  ondansetron (ZOFRAN ODT) 4 MG disintegrating tablet Take 1 tablet (4 mg total) by mouth every 8 (eight) hours as needed for nausea. 08/03/15   Rolland PorterMark Minnah Llamas, MD   BP 123/78 mmHg  Pulse 99  Temp(Src) 98 F (36.7 C) (Oral)  Resp 16  Ht 5\' 3"  (1.6 m)  Wt 276 lb (125.193 kg)  BMI 48.90 kg/m2  SpO2 98%  LMP 07/18/2015 Physical Exam  Constitutional: She is oriented to person, place, and time. She appears well-developed and well-nourished. No distress.  Awake and alert. No scleral icterus. Conjunctiva not pale.  HENT:  Head: Normocephalic.  Eyes: Conjunctivae are normal. Pupils are equal, round, and reactive to light. No scleral icterus.  Neck: Normal range of motion. Neck supple. No thyromegaly present.  Cardiovascular: Normal rate and regular rhythm.  Exam reveals no gallop and no friction rub.   No murmur heard. Pulmonary/Chest: Effort normal and breath sounds normal. No respiratory distress. She has no wheezes. She has no rales.  Abdominal: Soft. Bowel sounds are normal. She exhibits no distension. There is tenderness in the epigastric area. There is no rebound.    Musculoskeletal: Normal range of motion.  Neurological: She is alert and oriented to person, place, and time.  Skin: Skin is warm  and dry. No rash noted.  Psychiatric: She has a normal mood and affect. Her behavior is normal.    ED Course  Procedures (including critical care time) Labs Review Labs Reviewed  COMPREHENSIVE METABOLIC PANEL - Abnormal; Notable for the following:    Glucose, Bld 114 (*)    All other components within normal limits  CBC - Abnormal; Notable for the following:    WBC 18.7 (*)    All other components within normal limits  LIPASE, BLOOD  URINALYSIS, ROUTINE W REFLEX MICROSCOPIC (NOT AT [facility])  PREGNANCY, URINE    Imaging Review Dg Chest 2 View  08/03/2015  CLINICAL DATA:  Central chest pain for 1 day. Hypertension. Mitral and tricuspid valve regurgitation. EXAM: CHEST  2 VIEW COMPARISON:  09/22/2014 FINDINGS: The heart size and mediastinal contours are within normal limits. Low lung volumes noted. Both lungs are clear. No evidence of pneumothorax or pleural effusion. The visualized skeletal structures are unremarkable.  IMPRESSION: No active cardiopulmonary disease. Electronically Signed   By: Myles Rosenthal M.D.   On: 08/03/2015 17:14   I have personally reviewed and evaluated these images and lab results as part of my medical decision-making.   EKG Interpretation None      MDM   Final diagnoses:  Gastritis  Gastroesophageal reflux disease with esophagitis   Reassuring labs. Likely gastritis or acid related phenomenon. Plan proton pump inhibitor. Avoid alcohol, echo caffeine anti-inflammatories.    Rolland Porter, MD 08/03/15 2217

## 2015-08-03 NOTE — Discharge Instructions (Signed)
No smoking, alcohol, caffeine, or anti-inflammatory medicines. Very small, frequent meals. Do not eat within 2 hours of laying down. Expect slow improvement over the next 1 week   Gastroesophageal Reflux Disease, Adult Normally, food travels down the esophagus and stays in the stomach to be digested. If a person has gastroesophageal reflux disease (GERD), food and stomach acid move back up into the esophagus. When this happens, the esophagus becomes sore and swollen (inflamed). Over time, GERD can make small holes (ulcers) in the lining of the esophagus. HOME CARE Diet  Follow a diet as told by your doctor. You may need to avoid foods and drinks such as:  Coffee and tea (with or without caffeine).  Drinks that contain alcohol.  Energy drinks and sports drinks.  Carbonated drinks or sodas.  Chocolate and cocoa.  Peppermint and mint flavorings.  Garlic and onions.  Horseradish.  Spicy and acidic foods, such as peppers, chili powder, curry powder, vinegar, hot sauces, and BBQ sauce.  Citrus fruit juices and citrus fruits, such as oranges, lemons, and limes.  Tomato-based foods, such as red sauce, chili, salsa, and pizza with red sauce.  Fried and fatty foods, such as donuts, french fries, potato chips, and high-fat dressings.  High-fat meats, such as hot dogs, rib eye steak, sausage, ham, and bacon.  High-fat dairy items, such as whole milk, butter, and cream cheese.  Eat small meals often. Avoid eating large meals.  Avoid drinking large amounts of liquid with your meals.  Avoid eating meals during the 2-3 hours before bedtime.  Avoid lying down right after you eat.  Do not exercise right after you eat. General Instructions  Pay attention to any changes in your symptoms.  Take over-the-counter and prescription medicines only as told by your doctor. Do not take aspirin, ibuprofen, or other NSAIDs unless your doctor says it is okay.  Do not use any tobacco  products, including cigarettes, chewing tobacco, and e-cigarettes. If you need help quitting, ask your doctor.  Wear loose clothes. Do not wear anything tight around your waist.  Raise (elevate) the head of your bed about 6 inches (15 cm).  Try to lower your stress. If you need help doing this, ask your doctor.  If you are overweight, lose an amount of weight that is healthy for you. Ask your doctor about a safe weight loss goal.  Keep all follow-up visits as told by your doctor. This is important. GET HELP IF:  You have new symptoms.  You lose weight and you do not know why it is happening.  You have trouble swallowing, or it hurts to swallow.  You have wheezing or a cough that keeps happening.  Your symptoms do not get better with treatment.  You have a hoarse voice. GET HELP RIGHT AWAY IF:  You have pain in your arms, neck, jaw, teeth, or back.  You feel sweaty, dizzy, or light-headed.  You have chest pain or shortness of breath.  You throw up (vomit) and your throw up looks like blood or coffee grounds.  You pass out (faint).  Your poop (stool) is bloody or black.  You cannot swallow, drink, or eat.   This information is not intended to replace advice given to you by your health care provider. Make sure you discuss any questions you have with your health care provider.   Document Released: 09/07/2007 Document Revised: 12/10/2014 Document Reviewed: 07/16/2014 Elsevier Interactive Patient Education [employer]2016 Elsevier Inc.

## 2015-08-03 NOTE — ED Notes (Signed)
Pt reports generalized abd pain, n/v x 1 week. Pt also reports chest pain and back pain. Pt states she is unable to tolerate anything PO.

## 2015-10-28 ENCOUNTER — Ambulatory Visit: Payer: 59 | Admitting: Family Medicine

## 2015-10-28 DIAGNOSIS — Z0289 Encounter for other administrative examinations: Secondary | ICD-10-CM

## 2016-02-05 ENCOUNTER — Emergency Department (HOSPITAL_COMMUNITY)
Admission: EM | Admit: 2016-02-05 | Discharge: 2016-02-05 | Disposition: A | Discharge status: Home / Self Care | Payer: Self-pay | Attending: Emergency Medicine | Admitting: Emergency Medicine

## 2016-02-05 ENCOUNTER — Encounter (HOSPITAL_COMMUNITY): Payer: Self-pay | Admitting: Emergency Medicine

## 2016-02-05 ENCOUNTER — Emergency Department (HOSPITAL_COMMUNITY): Payer: Self-pay

## 2016-02-05 DIAGNOSIS — I10 Essential (primary) hypertension: Secondary | ICD-10-CM | POA: Insufficient documentation

## 2016-02-05 DIAGNOSIS — R52 Pain, unspecified: Secondary | ICD-10-CM

## 2016-02-05 DIAGNOSIS — N83202 Unspecified ovarian cyst, left side: Secondary | ICD-10-CM | POA: Insufficient documentation

## 2016-02-05 LAB — URINE MICROSCOPIC-ADD ON: RBC / HPF: NONE SEEN RBC/hpf (ref 0–5)

## 2016-02-05 LAB — URINALYSIS, ROUTINE W REFLEX MICROSCOPIC
Bilirubin Urine: NEGATIVE
GLUCOSE, UA: NEGATIVE mg/dL
HGB URINE DIPSTICK: NEGATIVE
Ketones, ur: NEGATIVE mg/dL
Nitrite: NEGATIVE
Specific Gravity, Urine: 1.015 (ref 1.005–1.030)
pH: 6.5 (ref 5.0–8.0)

## 2016-02-05 LAB — CBC
HEMATOCRIT: 37.6 % (ref 36.0–46.0)
HEMOGLOBIN: 12.8 g/dL (ref 12.0–15.0)
MCH: 31.3 pg (ref 26.0–34.0)
MCHC: 34 g/dL (ref 30.0–36.0)
MCV: 91.9 fL (ref 78.0–100.0)
PLATELETS: 337 10*3/uL (ref 150–400)
RBC: 4.09 MIL/uL (ref 3.87–5.11)
RDW: 13.5 % (ref 11.5–15.5)
WBC: 9.6 10*3/uL (ref 4.0–10.5)

## 2016-02-05 LAB — COMPREHENSIVE METABOLIC PANEL
ALT: 22 U/L (ref 14–54)
ANION GAP: 7 (ref 5–15)
AST: 20 U/L (ref 15–41)
Albumin: 3.7 g/dL (ref 3.5–5.0)
Alkaline Phosphatase: 91 U/L (ref 38–126)
BUN: 14 mg/dL (ref 6–20)
CHLORIDE: 106 mmol/L (ref 101–111)
CO2: 22 mmol/L (ref 22–32)
CREATININE: 0.84 mg/dL (ref 0.44–1.00)
Calcium: 8.6 mg/dL — ABNORMAL LOW (ref 8.9–10.3)
Glucose, Bld: 102 mg/dL — ABNORMAL HIGH (ref 65–99)
POTASSIUM: 3.9 mmol/L (ref 3.5–5.1)
SODIUM: 135 mmol/L (ref 135–145)
Total Bilirubin: 0.5 mg/dL (ref 0.3–1.2)
Total Protein: 7.1 g/dL (ref 6.5–8.1)

## 2016-02-05 LAB — LIPASE, BLOOD: LIPASE: 23 U/L (ref 11–51)

## 2016-02-05 LAB — PREGNANCY, URINE: Preg Test, Ur: NEGATIVE

## 2016-02-05 MED ORDER — HYDROMORPHONE HCL 1 MG/ML IJ SOLN
1.0000 mg | Freq: Once | INTRAMUSCULAR | Status: AC
  Administered 2016-02-05: 1 mg via INTRAVENOUS
  Filled 2016-02-05: qty 1

## 2016-02-05 MED ORDER — ONDANSETRON HCL 4 MG/2ML IJ SOLN
4.0000 mg | Freq: Once | INTRAMUSCULAR | Status: AC
  Administered 2016-02-05: 4 mg via INTRAVENOUS
  Filled 2016-02-05: qty 2

## 2016-02-05 MED ORDER — HYDROCODONE-ACETAMINOPHEN 5-325 MG PO TABS: 1.0000 | ORAL_TABLET | Freq: Four times a day (QID) | ORAL | 0 refills | Status: DC | PRN

## 2016-02-05 NOTE — ED Provider Notes (Signed)
[facility] DEPT Provider Note   CSN: [id number] Arrival date & time: 02/05/16  1525     History   Chief Complaint Chief Complaint  Patient presents with  . Abdominal Pain    HPI Sara Ramirez is a 35 y.o. female.  Patient complains of left lower quadrant abdominal pain for couple days now   The history is provided by the patient. No language interpreter was used.  Abdominal Pain   This is a new problem. The current episode started 2 days ago. The problem occurs constantly. The problem has not changed since onset.Associated with: Nothing. The pain is located in the LLQ. The pain is at a severity of 7/10. The pain is moderate. Pertinent negatives include anorexia, diarrhea, frequency, hematuria and headaches. Nothing aggravates the symptoms.    Past Medical History:  Diagnosis Date  . Anxiety   . Hypertension   . Kidney stone   . Migraines   . Mitral regurgitation   . Renal disorder    kidney stones  . Tricuspid regurgitation     Patient Active Problem List   Diagnosis Date Noted  . B12 deficiency 07/24/2015  . Uncontrolled hypertension 07/22/2015  . Morbid obesity (HCC) 07/22/2015  . Palpitations 07/22/2015  . Other chest pain 10/02/2014  . Indigestion 10/02/2014  . Situational anxiety 10/02/2014  . Migraine headache 11/19/2013  . Essential hypertension 11/19/2013  . BMI 50.0-59.9, adult (HCC) 11/19/2013    Past Surgical History:  Procedure Laterality Date  . CYSTOSCOPY W/ URETERAL STENT PLACEMENT Right 01/03/2013   Procedure: CYSTOSCOPY WITH RIGHT RETROGRADE PYELOGRAM/ RIGHT URETERAL STENT PLACEMENT;  Surgeon: Martina Sinner, MD;  Location: [facility];  Service: Urology;  Laterality: Right;  . LITHOTRIPSY      OB History    Gravida Para Term Preterm AB Living   1         1   SAB TAB Ectopic Multiple Live Births                   Home Medications    Prior to Admission medications   Medication Sig Start Date End Date Taking? Authorizing  Provider  HYDROcodone-acetaminophen (NORCO/VICODIN) 5-325 MG tablet Take 1 tablet by mouth every 6 (six) hours as needed for moderate pain. 02/05/16   Bethann Berkshire, MD    Family History Family History  Problem Relation Age of Onset  . Diabetes Father   . Migraines Mother   . Heart disease Paternal Grandmother   . Stroke Paternal Grandmother   . Migraines Son   . Diabetes Maternal Grandfather     Social History Social History  Substance Use Topics  . Smoking status: Never Smoker  . Smokeless tobacco: Never Used  . Alcohol use No     Allergies   Imitrex [sumatriptan] and Toradol [ketorolac tromethamine]   Review of Systems Review of Systems  Constitutional: Negative for appetite change and fatigue.  HENT: Negative for congestion, ear discharge and sinus pressure.   Eyes: Negative for discharge.  Respiratory: Negative for cough.   Cardiovascular: Negative for chest pain.  Gastrointestinal: Positive for abdominal pain. Negative for anorexia and diarrhea.  Genitourinary: Negative for frequency and hematuria.  Musculoskeletal: Negative for back pain.  Skin: Negative for rash.  Neurological: Negative for seizures and headaches.  Psychiatric/Behavioral: Negative for hallucinations.     Physical Exam Updated Vital Signs BP (!) 199/105 (BP Location: Left Arm) Comment: Patient is not taking her BP medications.  Pulse 106   Temp 98.2 F (  36.8 C) (Oral)   Resp 16   Ht 5\' 3"  (1.6 m)   Wt 260 lb (117.9 kg)   LMP 01/26/2016   SpO2 99%   BMI 46.06 kg/m   Physical Exam  Constitutional: She is oriented to person, place, and time. She appears well-developed.  HENT:  Head: Normocephalic.  Eyes: Conjunctivae and EOM are normal. No scleral icterus.  Neck: Neck supple. No thyromegaly present.  Cardiovascular: Normal rate and regular rhythm.  Exam reveals no gallop and no friction rub.   No murmur heard. Pulmonary/Chest: No stridor. She has no wheezes. She has no rales. She  exhibits no tenderness.  Abdominal: She exhibits no distension. There is no tenderness. There is no rebound.  Tender left lower quadrant  Musculoskeletal: Normal range of motion. She exhibits no edema.  Lymphadenopathy:    She has no cervical adenopathy.  Neurological: She is oriented to person, place, and time. She exhibits normal muscle tone. Coordination normal.  Skin: No rash noted. No erythema.  Psychiatric: She has a normal mood and affect. Her behavior is normal.     ED Treatments / Results  Labs (all labs ordered are listed, but only abnormal results are displayed) Labs Reviewed  COMPREHENSIVE METABOLIC PANEL - Abnormal; Notable for the following:       Result Value   Glucose, Bld 102 (*)    Calcium 8.6 (*)    All other components within normal limits  URINALYSIS, ROUTINE W REFLEX MICROSCOPIC (NOT AT [facility]) - Abnormal; Notable for the following:    Protein, ur TRACE (*)    Leukocytes, UA TRACE (*)    All other components within normal limits  URINE MICROSCOPIC-ADD ON - Abnormal; Notable for the following:    Squamous Epithelial / LPF 6-30 (*)    Bacteria, UA FEW (*)    All other components within normal limits  LIPASE, BLOOD  CBC  PREGNANCY, URINE    EKG  EKG Interpretation None       Radiology Ct Renal Stone Study  Result Date: 02/05/2016 CLINICAL DATA:  LEFT lower quadrant pain for 2 days, nausea, vomiting, history hypertension, renal disease, kidney stones post lithotripsy EXAM: CT ABDOMEN AND PELVIS WITHOUT CONTRAST TECHNIQUE: Multidetector CT imaging of the abdomen and pelvis was performed following the standard protocol without IV contrast. Sagittal and coronal MPR images reconstructed from axial data set. Oral contrast not utilized for this indication. COMPARISON:  02/08/2013 FINDINGS: Lower chest: Lung bases clear Hepatobiliary: Numerous dependent calcified gallstones in gallbladder. No gallbladder wall thickening. Liver unremarkable. Pancreas: Normal  appearance Spleen: Normal appearance Adrenals/Urinary Tract: Adrenal glands normal appearance. Kidneys, ureters, and bladder normal appearance. Stomach/Bowel: Normal appendix. Stomach and bowel loops unremarkable for technique. Vascular/Lymphatic: Unremarkable Reproductive: Uterus and RIGHT adnexa unremarkable. Large LEFT adnexal cystic lesion 6.7 x 6.7 x 6.3 cm question complicated cyst containing a septation. Margins appear irregular suggesting minimal adjacent fluid or edema. Other: No free air or free fluid.  No inflammatory process. Musculoskeletal: Unremarkable IMPRESSION: Cholelithiasis. Large probable complicated LEFT ovarian cystic lesion 6.7 x 6.7 x 6.3 cm in size containing a septation and question mild adjacent edema ; recommend pelvic and transvaginal ultrasound to assess, with additional pelvic Doppler imaging if there is clinical concern for ovarian torsion. Electronically Signed   By: Ulyses SouthwardMark  Boles M.D.   On: 02/05/2016 18:12    Procedures Procedures (including critical care time)  Medications Ordered in ED Medications  HYDROmorphone (DILAUDID) injection 1 mg (1 mg Intravenous Given 02/05/16 1626)  ondansetron (ZOFRAN) injection 4 mg (4 mg Intravenous Given 02/05/16 1626)     Initial Impression / Assessment and Plan / ED Course  I have reviewed the triage vital signs and the nursing notes.  Pertinent labs & imaging results that were available during my care of the patient were reviewed by me and considered in my medical decision making (see chart for details).  Clinical Course    Labs unremarkable. CT scan shows ovarian cyst on the left side. Patient sent home with pain medicine and told to follow-up with OB/GYN  Final Clinical Impressions(s) / ED Diagnoses   Final diagnoses:  Pain  Cyst of left ovary    New Prescriptions New Prescriptions   HYDROCODONE-ACETAMINOPHEN (NORCO/VICODIN) 5-325 MG TABLET    Take 1 tablet by mouth every 6 (six) hours as needed for moderate pain.       Bethann BerkshireJoseph Alwaleed Obeso, MD 02/05/16 [id number]

## 2016-02-05 NOTE — Discharge Instructions (Signed)
Follow up with dr. Ferguson next week. °

## 2016-02-05 NOTE — ED Notes (Signed)
Pt made aware to return if symptoms worsen or if any life threatening symptoms occur.   

## 2016-02-05 NOTE — ED Triage Notes (Signed)
Lower abdominal pain, started on the left 2 days ago, nausea and vomiting

## 2016-03-30 ENCOUNTER — Emergency Department (HOSPITAL_COMMUNITY)
Admission: EM | Admit: 2016-03-30 | Discharge: 2016-03-30 | Disposition: A | Discharge status: Home / Self Care | Payer: Self-pay | Attending: Emergency Medicine | Admitting: Emergency Medicine

## 2016-03-30 ENCOUNTER — Encounter (HOSPITAL_COMMUNITY): Payer: Self-pay | Admitting: Cardiology

## 2016-03-30 DIAGNOSIS — R51 Headache: Secondary | ICD-10-CM | POA: Insufficient documentation

## 2016-03-30 DIAGNOSIS — R109 Unspecified abdominal pain: Secondary | ICD-10-CM | POA: Insufficient documentation

## 2016-03-30 DIAGNOSIS — R112 Nausea with vomiting, unspecified: Secondary | ICD-10-CM | POA: Insufficient documentation

## 2016-03-30 DIAGNOSIS — R197 Diarrhea, unspecified: Secondary | ICD-10-CM | POA: Insufficient documentation

## 2016-03-30 DIAGNOSIS — I10 Essential (primary) hypertension: Secondary | ICD-10-CM | POA: Insufficient documentation

## 2016-03-30 LAB — BASIC METABOLIC PANEL
Anion gap: 8 (ref 5–15)
BUN: 14 mg/dL (ref 6–20)
CO2: 26 mmol/L (ref 22–32)
Calcium: 8.8 mg/dL — ABNORMAL LOW (ref 8.9–10.3)
Chloride: 101 mmol/L (ref 101–111)
Creatinine, Ser: 0.84 mg/dL (ref 0.44–1.00)
GFR calc Af Amer: >60 mL/min (ref >60)
GFR calc non Af Amer: >60 mL/min (ref >60)
Glucose, Bld: 95 mg/dL (ref 65–99)
Potassium: 3.8 mmol/L (ref 3.5–5.1)
Sodium: 135 mmol/L (ref 135–145)

## 2016-03-30 LAB — CBC WITH DIFFERENTIAL/PLATELET
Basophils Absolute: 0 10*3/uL (ref 0.0–0.1)
Basophils Relative: 0 %
Eosinophils Absolute: 0.1 10*3/uL (ref 0.0–0.7)
Eosinophils Relative: 1 %
HCT: 38.1 % (ref 36.0–46.0)
Hemoglobin: 12.9 g/dL (ref 12.0–15.0)
Lymphocytes Relative: 23 %
Lymphs Abs: 1.7 10*3/uL (ref 0.7–4.0)
MCH: 31.9 pg (ref 26.0–34.0)
MCHC: 33.9 g/dL (ref 30.0–36.0)
MCV: 94.3 fL (ref 78.0–100.0)
Monocytes Absolute: 0.3 10*3/uL (ref 0.1–1.0)
Monocytes Relative: 4 %
Neutro Abs: 5.3 10*3/uL (ref 1.7–7.7)
Neutrophils Relative %: 72 %
Platelets: 279 10*3/uL (ref 150–400)
RBC: 4.04 MIL/uL (ref 3.87–5.11)
RDW: 13.4 % (ref 11.5–15.5)
WBC: 7.5 10*3/uL (ref 4.0–10.5)

## 2016-03-30 LAB — URINALYSIS, ROUTINE W REFLEX MICROSCOPIC
Bilirubin Urine: NEGATIVE
Glucose, UA: NEGATIVE mg/dL
Hgb urine dipstick: NEGATIVE
Ketones, ur: NEGATIVE mg/dL
Leukocytes, UA: NEGATIVE
Nitrite: NEGATIVE
Protein, ur: NEGATIVE mg/dL
Specific Gravity, Urine: 1.02 (ref 1.005–1.030)
pH: 7 (ref 5.0–8.0)

## 2016-03-30 LAB — PREGNANCY, URINE: Preg Test, Ur: NEGATIVE

## 2016-03-30 MED ORDER — LOPERAMIDE HCL 2 MG PO CAPS
4.0000 mg | ORAL_CAPSULE | Freq: Once | ORAL | Status: AC
  Administered 2016-03-30: 4 mg via ORAL
  Filled 2016-03-30: qty 2

## 2016-03-30 MED ORDER — LOPERAMIDE HCL 2 MG PO CAPS
ORAL_CAPSULE | ORAL | Status: AC
  Filled 2016-03-30: qty 1

## 2016-03-30 MED ORDER — SODIUM CHLORIDE 0.9 % IV BOLUS (SEPSIS)
1000.0000 mL | Freq: Once | INTRAVENOUS | Status: AC
  Administered 2016-03-30: 1000 mL via INTRAVENOUS

## 2016-03-30 MED ORDER — MORPHINE SULFATE (PF) 4 MG/ML IV SOLN
4.0000 mg | Freq: Once | INTRAVENOUS | Status: AC
  Administered 2016-03-30: 4 mg via INTRAVENOUS
  Filled 2016-03-30: qty 1

## 2016-03-30 MED ORDER — ONDANSETRON HCL 4 MG PO TABS: 4.0000 mg | ORAL_TABLET | Freq: Three times a day (TID) | ORAL | 0 refills | Status: DC | PRN

## 2016-03-30 MED ORDER — ONDANSETRON HCL 4 MG/2ML IJ SOLN
4.0000 mg | Freq: Once | INTRAMUSCULAR | Status: AC
  Administered 2016-03-30: 4 mg via INTRAVENOUS
  Filled 2016-03-30: qty 2

## 2016-03-30 NOTE — ED Provider Notes (Signed)
[facility]PT Provider Note   CSN: [id number] Arrival date & time: 03/30/16  0820    By signing my name below, I, Valentino SaxonBianca Contreras, attest that this documentation has been prepared under the direction and in the presence of Raeford RazorStephen Ferrin Liebig, MD. Electronically Signed: Valentino SaxonBianca Contreras, ED Scribe. 03/30/16. 9:29 AM.  History   Chief Complaint Chief Complaint  Patient presents with  . Emesis   The history is provided by the patient. No language interpreter was used.    HPI Comments: Sara Ramirez is a 35 y.o. female who presents to the Emergency Department complaining of moderate, episodic emesis onset earlier this morning. Pt notes she has been up since 5:30am today. She reports associated HA and abdominal pain. She states feeling like her eyes are "bulging out" due to her HA. Pt notes her vomit is foamy-like accompanied by a smell of sulfur. She describes her stomach feeling like it is being shredded to pieces. No alleviating factors noted. Pt denies fever, nausea, blood in stool, hematuria, dysuria. She notes recent sick contact at home.   Past Medical History:  Diagnosis Date  . Anxiety   . Hypertension   . Kidney stone   . Migraines   . Mitral regurgitation   . Renal disorder    kidney stones  . Tricuspid regurgitation     Patient Active Problem List   Diagnosis Date Noted  . B12 deficiency 07/24/2015  . Uncontrolled hypertension 07/22/2015  . Morbid obesity (HCC) 07/22/2015  . Palpitations 07/22/2015  . Other chest pain 10/02/2014  . Indigestion 10/02/2014  . Situational anxiety 10/02/2014  . Migraine headache 11/19/2013  . Essential hypertension 11/19/2013  . BMI 50.0-59.9, adult (HCC) 11/19/2013    Past Surgical History:  Procedure Laterality Date  . CYSTOSCOPY W/ URETERAL STENT PLACEMENT Right 01/03/2013   Procedure: CYSTOSCOPY WITH RIGHT RETROGRADE PYELOGRAM/ RIGHT URETERAL STENT PLACEMENT;  Surgeon: Martina SinnerScott A MacDiarmid, MD;  Location: [facility];   Service: Urology;  Laterality: Right;  . LITHOTRIPSY      OB History    Gravida Para Term Preterm AB Living   1         1   SAB TAB Ectopic Multiple Live Births                   Home Medications    Prior to Admission medications   Medication Sig Start Date End Date Taking? Authorizing Provider  HYDROcodone-acetaminophen (NORCO/VICODIN) 5-325 MG tablet Take 1 tablet by mouth every 6 (six) hours as needed for moderate pain. 02/05/16   Bethann BerkshireJoseph Zammit, MD    Family History Family History  Problem Relation Age of Onset  . Diabetes Father   . Migraines Mother   . Heart disease Paternal Grandmother   . Stroke Paternal Grandmother   . Migraines Son   . Diabetes Maternal Grandfather     Social History Social History  Substance Use Topics  . Smoking status: Never Smoker  . Smokeless tobacco: Never Used  . Alcohol use No     Allergies   Imitrex [sumatriptan] and Toradol [ketorolac tromethamine]   Review of Systems Review of Systems  Constitutional: Negative for fever.  Gastrointestinal: Positive for abdominal pain and vomiting. Negative for blood in stool and nausea.  Genitourinary: Negative for dysuria and hematuria.  Neurological: Positive for headaches.  All other systems reviewed and are negative.    Physical Exam Updated Vital Signs BP (!) 181/104 (BP Location: Left Arm)   Pulse 98   Temp  98.4 F (36.9 C) (Oral)   Resp 18   Ht 5\' 3"  (1.6 m)   Wt 250 lb (113.4 kg)   LMP 03/05/2016   SpO2 99%   BMI 44.29 kg/m   Physical Exam  Constitutional: She appears well-developed and well-nourished. No distress.  HENT:  Head: Normocephalic and atraumatic.  Mouth/Throat: Oropharynx is clear and moist. No oropharyngeal exudate.  Eyes: Conjunctivae and EOM are normal. Pupils are equal, round, and reactive to light. Right eye exhibits no discharge. Left eye exhibits no discharge. No scleral icterus.  Neck: Normal range of motion. Neck supple. No JVD present. No  thyromegaly present.  Cardiovascular: Normal rate, regular rhythm, normal heart sounds and intact distal pulses.  Exam reveals no gallop and no friction rub.   No murmur heard. Pulmonary/Chest: Effort normal and breath sounds normal. No respiratory distress. She has no wheezes. She has no rales.  Abdominal: Soft. Bowel sounds are normal. She exhibits no distension and no mass. There is no tenderness.  Musculoskeletal: Normal range of motion. She exhibits no edema or tenderness.  Lymphadenopathy:    She has no cervical adenopathy.  Neurological: She is alert. Coordination normal.  Skin: Skin is warm and dry. No rash noted. No erythema.  Psychiatric: She has a normal mood and affect. Her behavior is normal.  Nursing note and vitals reviewed.    ED Treatments / Results   DIAGNOSTIC STUDIES: Oxygen Saturation is 99% on RA, normal; by my interpretation.    COORDINATION OF CARE: 9:20 AM Discussed treatment plan with pt at bedside which includes labs, nausea medication and pain medication and pt agreed to plan.   Labs (all labs ordered are listed, but only abnormal results are displayed) Labs Reviewed  BASIC METABOLIC PANEL - Abnormal; Notable for the following:       Result Value   Calcium 8.8 (*)    All other components within normal limits  CBC WITH DIFFERENTIAL/PLATELET  URINALYSIS, ROUTINE W REFLEX MICROSCOPIC  PREGNANCY, URINE    EKG  EKG Interpretation None       Radiology No results found.  Procedures Procedures (including critical care time)  Medications Ordered in ED Medications  sodium chloride 0.9 % bolus 1,000 mL (not administered)  ondansetron (ZOFRAN) injection 4 mg (not administered)  morphine 4 MG/ML injection 4 mg (not administered)  loperamide (IMODIUM) capsule 4 mg (not administered)     Initial Impression / Assessment and Plan / ED Course  I have reviewed the triage vital signs and the nursing notes.  Pertinent labs & imaging results that  were available during my care of the patient were reviewed by me and considered in my medical decision making (see chart for details).  Clinical Course     35 year-old female with nausea, vomiting or diarrhea. Suspect viral illness. Symptomatically treated. Now feels sniffily better. I will suspicion for serious bacterial illness or other emergent process. Plan continue symptomatically treatment. Return precautions were discussed.  Final Clinical Impressions(s) / ED Diagnoses   Final diagnoses:  Nausea vomiting and diarrhea    New Prescriptions New Prescriptions   No medications on file    I personally preformed the services scribed in my presence. The recorded information has been reviewed is accurate. Raeford RazorStephen Paislie Tessler, MD.      Raeford RazorStephen Skipper Dacosta, MD 04/04/16 [phone]

## 2016-03-30 NOTE — ED Triage Notes (Signed)
Headache ,  Vomiting, diarrhea since 5 am.

## 2016-05-30 ENCOUNTER — Telehealth: Payer: Self-pay | Admitting: Cardiology

## 2016-05-30 NOTE — Telephone Encounter (Signed)
Numerous attempts to contact patient with recall letters. Unable to reach by telephone. with no success.   [[id number]][[id number]] 11/06/2014 11:42 AM New [10]    [System] 08/03/2015 11:00 PM Notification Sent [20]   Sara Ramirez [[id number]][[id number]] 12/23/2015 2:39 PM Notification Sent [20]   Sara Ramirez [[id number]][[id number]] 03/09/2016 8:37 AM Notification Sent [20]   Sara Ramirez [[id number]][[id number]] 03/22/2016 2:39 PM Notification Sent [20]   Sara Ramirez [[id number]][[id number]] 05/30/2016 11:47 AM Notification Sent [20]

## 2016-09-25 ENCOUNTER — Emergency Department (HOSPITAL_COMMUNITY)
Admission: EM | Admit: 2016-09-25 | Discharge: 2016-09-25 | Disposition: A | Discharge status: Home / Self Care | Payer: Self-pay | Attending: Emergency Medicine | Admitting: Emergency Medicine

## 2016-09-25 ENCOUNTER — Encounter (HOSPITAL_COMMUNITY): Payer: Self-pay | Admitting: Emergency Medicine

## 2016-09-25 DIAGNOSIS — Z5321 Procedure and treatment not carried out due to patient leaving prior to being seen by health care provider: Secondary | ICD-10-CM | POA: Insufficient documentation

## 2016-09-25 DIAGNOSIS — I10 Essential (primary) hypertension: Secondary | ICD-10-CM | POA: Insufficient documentation

## 2016-09-25 DIAGNOSIS — R110 Nausea: Secondary | ICD-10-CM | POA: Insufficient documentation

## 2016-09-25 LAB — COMPREHENSIVE METABOLIC PANEL
ALK PHOS: 98 U/L (ref 38–126)
ALT: 28 U/L (ref 14–54)
ANION GAP: 7 (ref 5–15)
AST: 26 U/L (ref 15–41)
Albumin: 3.6 g/dL (ref 3.5–5.0)
BUN: 13 mg/dL (ref 6–20)
CALCIUM: 8.9 mg/dL (ref 8.9–10.3)
CHLORIDE: 107 mmol/L (ref 101–111)
CO2: 27 mmol/L (ref 22–32)
CREATININE: 0.85 mg/dL (ref 0.44–1.00)
Glucose, Bld: 108 mg/dL — ABNORMAL HIGH (ref 65–99)
Potassium: 3.8 mmol/L (ref 3.5–5.1)
SODIUM: 141 mmol/L (ref 135–145)
Total Bilirubin: 0.7 mg/dL (ref 0.3–1.2)
Total Protein: 7.1 g/dL (ref 6.5–8.1)

## 2016-09-25 LAB — LIPASE, BLOOD: LIPASE: 24 U/L (ref 11–51)

## 2016-09-25 LAB — CBC
HCT: 38.1 % (ref 36.0–46.0)
HEMOGLOBIN: 12.9 g/dL (ref 12.0–15.0)
MCH: 31.4 pg (ref 26.0–34.0)
MCHC: 33.9 g/dL (ref 30.0–36.0)
MCV: 92.7 fL (ref 78.0–100.0)
PLATELETS: 281 10*3/uL (ref 150–400)
RBC: 4.11 MIL/uL (ref 3.87–5.11)
RDW: 13.2 % (ref 11.5–15.5)
WBC: 5.1 10*3/uL (ref 4.0–10.5)

## 2016-09-25 NOTE — ED Notes (Signed)
Pt states she doesn't want to wait any longer 

## 2016-09-25 NOTE — ED Triage Notes (Signed)
N/V and epigastric pain since last night.  States she was burning up last night and could have had a fever

## 2016-10-22 ENCOUNTER — Emergency Department (HOSPITAL_COMMUNITY)
Admission: EM | Admit: 2016-10-22 | Discharge: 2016-10-22 | Disposition: A | Discharge status: Home / Self Care | Payer: Self-pay | Attending: Emergency Medicine | Admitting: Emergency Medicine

## 2016-10-22 ENCOUNTER — Encounter (HOSPITAL_COMMUNITY): Payer: Self-pay | Admitting: *Deleted

## 2016-10-22 DIAGNOSIS — J00 Acute nasopharyngitis [common cold]: Secondary | ICD-10-CM | POA: Insufficient documentation

## 2016-10-22 DIAGNOSIS — H6502 Acute serous otitis media, left ear: Secondary | ICD-10-CM | POA: Insufficient documentation

## 2016-10-22 DIAGNOSIS — I10 Essential (primary) hypertension: Secondary | ICD-10-CM | POA: Insufficient documentation

## 2016-10-22 DIAGNOSIS — J40 Bronchitis, not specified as acute or chronic: Secondary | ICD-10-CM | POA: Insufficient documentation

## 2016-10-22 DIAGNOSIS — H66001 Acute suppurative otitis media without spontaneous rupture of ear drum, right ear: Secondary | ICD-10-CM | POA: Insufficient documentation

## 2016-10-22 MED ORDER — AMOXICILLIN 500 MG PO CAPS: 500.0000 mg | ORAL_CAPSULE | Freq: Three times a day (TID) | ORAL | 0 refills | Status: DC

## 2016-10-22 MED ORDER — ACETAMINOPHEN 500 MG PO TABS
1000.0000 mg | ORAL_TABLET | Freq: Once | ORAL | Status: AC
  Administered 2016-10-22: 1000 mg via ORAL
  Filled 2016-10-22: qty 2

## 2016-10-22 MED ORDER — IBUPROFEN 400 MG PO TABS
600.0000 mg | ORAL_TABLET | Freq: Once | ORAL | Status: AC
  Administered 2016-10-22: 600 mg via ORAL
  Filled 2016-10-22: qty 2

## 2016-10-22 NOTE — Discharge Instructions (Signed)
Use heat on your face for comfort and for ear pain. Take the antibiotic until gone. Take mucinex DM OTC for cough. Take ibuprofen 600 mg + acetaminophen 1000 mg 4 times a day for pain as needed. Use Afrin nasal spray to reduce the swelling in your nose which will help the fluid drain from your ears. DO NOT USE FOR MORE THAN 3-4 days.  Take claritin or zyrtec OTC which will also help with the swelling in your nose.   Recheck if you get a fever, drainage from your ears, you are struggling to breathe or you seem worse.

## 2016-10-22 NOTE — ED Provider Notes (Signed)
[facility] Provider Note   CSN: [id number] Arrival date & time: 10/22/16  0559  Time seen 06:25 AM  History   Chief Complaint Chief Complaint  Patient presents with  . Otalgia    HPI Sara Ramirez is a 36 y.o. female.  HPI patient states on July 13 she started with a stuffy nose, green rhinorrhea, cough with green sputum production without fever. She was seen on July 16 by her work doctor and was told if she gets worse he would do other treatment. He saw her again on July 18 and diagnosed her with sinusitis and bronchitis and gave her 2 shots, one was a steroid shot and the other was a possible antibiotic. She started Zithromax on Wednesday and took it for 3 days. She states on July 19 she started having ear pain area and she states she feels like she can't hear like she is under water. She states her sore throat is gone, she still has her cough, and she has central chest pain when she coughs. She denies dyspnea on exertion. She may have had some wheezing but she was not put on an inhaler. She states her pain became worse tonight. She had some nausea this morning without vomiting or diarrhea.   PCP Dr Vedia Coffer in [place], [place]  Past Medical History:  Diagnosis Date  . Anxiety   . Hypertension   . Kidney stone   . Migraines   . Mitral regurgitation   . Renal disorder    kidney stones  . Tricuspid regurgitation     Patient Active Problem List   Diagnosis Date Noted  . B12 deficiency 07/24/2015  . Uncontrolled hypertension 07/22/2015  . Morbid obesity (HCC) 07/22/2015  . Palpitations 07/22/2015  . Other chest pain 10/02/2014  . Indigestion 10/02/2014  . Situational anxiety 10/02/2014  . Migraine headache 11/19/2013  . Essential hypertension 11/19/2013  . BMI 50.0-59.9, adult (HCC) 11/19/2013    Past Surgical History:  Procedure Laterality Date  . CYSTOSCOPY W/ URETERAL STENT PLACEMENT Right 01/03/2013   Procedure: CYSTOSCOPY WITH RIGHT RETROGRADE PYELOGRAM/  RIGHT URETERAL STENT PLACEMENT;  Surgeon: Martina Sinner, MD;  Location: [facility];  Service: Urology;  Laterality: Right;  . LITHOTRIPSY      OB History    Gravida Para Term Preterm AB Living   1         1   SAB TAB Ectopic Multiple Live Births                   Home Medications    Prior to Admission medications   Medication Sig Start Date End Date Taking? Authorizing Provider  amoxicillin (AMOXIL) 500 MG capsule Take 1 capsule (500 mg total) by mouth 3 (three) times daily. 10/22/16   Devoria Albe, MD  HYDROcodone-acetaminophen (NORCO/VICODIN) 5-325 MG tablet Take 1 tablet by mouth every 6 (six) hours as needed for moderate pain. Patient not taking: Reported on 03/30/2016 02/05/16   Bethann Berkshire, MD  ondansetron (ZOFRAN) 4 MG tablet Take 1 tablet (4 mg total) by mouth every 8 (eight) hours as needed for nausea or vomiting. 03/30/16   Raeford Razor, MD    Family History Family History  Problem Relation Age of Onset  . Diabetes Father   . Migraines Mother   . Heart disease Paternal Grandmother   . Stroke Paternal Grandmother   . Migraines Son   . Diabetes Maternal Grandfather     Social History Social History  Substance Use Topics  .  Smoking status: Never Smoker  . Smokeless tobacco: Never Used  . 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    Physical Exam Updated Vital Signs BP (!) 179/120 (BP Location: Right Arm)   Pulse 98   Temp 98.1 F (36.7 C) (Oral)   Resp 20   Ht 5\' 3"  (1.6 m)   Wt 127 kg (280 lb)   LMP 10/05/2016   SpO2 100%   BMI 49.60 kg/m   Vital signs normal except for hypertension   Physical Exam  Constitutional: She is oriented to person, place, and time. She appears well-developed and well-nourished.  Non-toxic appearance. She does not appear ill. No distress.  HENT:    Head: Normocephalic and atraumatic.  Right Ear: External ear normal.  Left Ear: External ear normal.  Nose: Mucosal edema and rhinorrhea present.  Mouth/Throat: Oropharynx is clear and moist and mucous membranes are normal. No dental abscesses or uvula swelling.  Patient has a lot of mucosal edema with decreased airflow through her nose.  On exam of her left TM she appears to have some clear fluid behind the tympanic membrane with some increased prominence of the stapes however the TM is still translucent.  On the right she has some clear fluid but she also has some purulent material seen posteriorly behind her tympanic membrane. There is increased redness and injection compared to the left.  There is no tenderness to tragal pulling on either side.  Eyes: Pupils are equal, round, and reactive to light. Conjunctivae and EOM are normal.  Neck: Normal range of motion and full passive range of motion without pain. Neck supple.  Cardiovascular: Normal rate, regular rhythm and normal heart sounds.  Exam reveals no gallop and no friction rub.   No murmur heard. Pulmonary/Chest: Effort normal and breath sounds normal. No respiratory distress. She has no wheezes. She has no rhonchi. She has no rales. She exhibits no tenderness and no crepitus.  Abdominal: Soft. Normal appearance and bowel sounds are normal. She exhibits no distension. There is no tenderness. There is no rebound and no guarding.  Musculoskeletal: Normal range of motion. She exhibits no edema or tenderness.  Moves all extremities well.   Neurological: She is alert and oriented to person, place, and time. She has normal strength. No cranial nerve deficit.  Skin: Skin is warm, dry and intact. No rash noted. No erythema. No pallor.  Psychiatric: She has a normal mood and affect. Her speech is normal and behavior is normal. Her mood appears not anxious.  Nursing note and vitals reviewed.    ED Treatments / Results  Labs (all labs  ordered are listed, but only abnormal results are displayed) Labs Reviewed - No data to display  EKG  EKG Interpretation None       Radiology No results found.  Procedures Procedures (including critical care time)  Medications Ordered in ED Medications  ibuprofen (ADVIL,MOTRIN) tablet 600 mg (not administered)  acetaminophen (TYLENOL) tablet 1,000 mg (not administered)     Initial Impression / Assessment and Plan / ED Course  I have reviewed the triage vital signs and the nursing notes.  Pertinent labs & imaging results that were available during my care of the patient were reviewed by me and considered in my medical decision making (see chart for details).   Patient was given  ibuprofen and acetaminophen for pain. I think she had a URI and developed serous otitis and then otitis media on the right. She was advised to use Afrin nasal spray for 3-4 days then stop, she should also start on either Claritin or Zyrtec over-the-counter once a day to help with mucosal swelling which will help the fluid drain from her ears. This will help with her infection and also her hearing. She was placed on amoxicillin for 10 days. She can take Mucinex DM over-the-counter for cough, she take ibuprofen and acetaminophen for her chest wall pain and other body aches. I do not hear wheezing and she was not started on a inhaler.  Final Clinical Impressions(s) / ED Diagnoses   Final diagnoses:  Bronchitis  Acute serous otitis media of left ear, recurrence not specified  Acute suppurative otitis media of right ear without spontaneous rupture of tympanic membrane, recurrence not specified  Acute rhinitis    New Prescriptions New Prescriptions   AMOXICILLIN (AMOXIL) 500 MG CAPSULE    Take 1 capsule (500 mg total) by mouth 3 (three) times daily.  OTC afrin, claritin or zyrtec, mucinex DM, acetaminophen and motrin  Plan discharge  Devoria Albe, MD, Concha Pyo, MD 10/22/16 [phone]

## 2016-10-22 NOTE — ED Triage Notes (Signed)
Pt states ear pain & pressure for the past week. Was sen by work MD & given 2 shots. 1 steroid & something else.

## 2018-12-17 DIAGNOSIS — I161 Hypertensive emergency: Secondary | ICD-10-CM | POA: Diagnosis not present

## 2018-12-17 DIAGNOSIS — R072 Precordial pain: Secondary | ICD-10-CM | POA: Diagnosis not present

## 2018-12-17 DIAGNOSIS — I10 Essential (primary) hypertension: Secondary | ICD-10-CM | POA: Diagnosis not present

## 2018-12-17 DIAGNOSIS — R079 Chest pain, unspecified: Secondary | ICD-10-CM | POA: Diagnosis not present

## 2019-06-26 DIAGNOSIS — I10 Essential (primary) hypertension: Secondary | ICD-10-CM | POA: Diagnosis not present

## 2019-06-26 DIAGNOSIS — Z6841 Body Mass Index (BMI) 40.0 and over, adult: Secondary | ICD-10-CM | POA: Diagnosis not present

## 2019-06-26 DIAGNOSIS — G43909 Migraine, unspecified, not intractable, without status migrainosus: Secondary | ICD-10-CM | POA: Diagnosis not present

## 2019-06-26 DIAGNOSIS — F330 Major depressive disorder, recurrent, mild: Secondary | ICD-10-CM | POA: Diagnosis not present

## 2020-06-08 ENCOUNTER — Emergency Department (HOSPITAL_COMMUNITY)
Admission: EM | Admit: 2020-06-08 | Discharge: 2020-06-08 | Disposition: A | Discharge status: Home / Self Care | Payer: Self-pay | Attending: Emergency Medicine | Admitting: Emergency Medicine

## 2020-06-08 ENCOUNTER — Emergency Department (HOSPITAL_COMMUNITY): Payer: Self-pay

## 2020-06-08 ENCOUNTER — Encounter (HOSPITAL_COMMUNITY): Payer: Self-pay | Admitting: *Deleted

## 2020-06-08 ENCOUNTER — Other Ambulatory Visit: Payer: Self-pay

## 2020-06-08 ENCOUNTER — Emergency Department (HOSPITAL_COMMUNITY): Admission: EM | Admit: 2020-06-08 | Discharge: 2020-06-08 | Discharge status: Absent without leave | Payer: Self-pay

## 2020-06-08 DIAGNOSIS — Z79899 Other long term (current) drug therapy: Secondary | ICD-10-CM | POA: Insufficient documentation

## 2020-06-08 DIAGNOSIS — R1011 Right upper quadrant pain: Secondary | ICD-10-CM

## 2020-06-08 DIAGNOSIS — I159 Secondary hypertension, unspecified: Secondary | ICD-10-CM | POA: Insufficient documentation

## 2020-06-08 DIAGNOSIS — K8020 Calculus of gallbladder without cholecystitis without obstruction: Secondary | ICD-10-CM | POA: Insufficient documentation

## 2020-06-08 LAB — COMPREHENSIVE METABOLIC PANEL
ALT: 28 U/L (ref 0–44)
AST: 24 U/L (ref 15–41)
Albumin: 3.8 g/dL (ref 3.5–5.0)
Alkaline Phosphatase: 92 U/L (ref 38–126)
Anion gap: 10 (ref 5–15)
BUN: 13 mg/dL (ref 6–20)
CO2: 23 mmol/L (ref 22–32)
Calcium: 8.7 mg/dL — ABNORMAL LOW (ref 8.9–10.3)
Chloride: 105 mmol/L (ref 98–111)
Creatinine, Ser: 0.79 mg/dL (ref 0.44–1.00)
GFR, Estimated: >60 mL/min (ref >60)
Glucose, Bld: 96 mg/dL (ref 70–99)
Potassium: 3.8 mmol/L (ref 3.5–5.1)
Sodium: 138 mmol/L (ref 135–145)
Total Bilirubin: 0.6 mg/dL (ref 0.3–1.2)
Total Protein: 6.9 g/dL (ref 6.5–8.1)

## 2020-06-08 LAB — CBC WITH DIFFERENTIAL/PLATELET
Abs Immature Granulocytes: 0.05 10*3/uL (ref 0.00–0.07)
Basophils Absolute: 0 10*3/uL (ref 0.0–0.1)
Basophils Relative: 0 %
Eosinophils Absolute: 0.2 10*3/uL (ref 0.0–0.5)
Eosinophils Relative: 2 %
HCT: 38.2 % (ref 36.0–46.0)
Hemoglobin: 12.4 g/dL (ref 12.0–15.0)
Immature Granulocytes: 1 %
Lymphocytes Relative: 28 %
Lymphs Abs: 2.4 10*3/uL (ref 0.7–4.0)
MCH: 30.3 pg (ref 26.0–34.0)
MCHC: 32.5 g/dL (ref 30.0–36.0)
MCV: 93.4 fL (ref 80.0–100.0)
Monocytes Absolute: 0.4 10*3/uL (ref 0.1–1.0)
Monocytes Relative: 5 %
Neutro Abs: 5.5 10*3/uL (ref 1.7–7.7)
Neutrophils Relative %: 64 %
Platelets: 364 10*3/uL (ref 150–400)
RBC: 4.09 MIL/uL (ref 3.87–5.11)
RDW: 14 % (ref 11.5–15.5)
WBC: 8.6 10*3/uL (ref 4.0–10.5)
nRBC: 0 % (ref 0.0–0.2)

## 2020-06-08 LAB — URINALYSIS, ROUTINE W REFLEX MICROSCOPIC
Bilirubin Urine: NEGATIVE
Glucose, UA: NEGATIVE mg/dL
Hgb urine dipstick: NEGATIVE
Ketones, ur: NEGATIVE mg/dL
Leukocytes,Ua: NEGATIVE
Nitrite: NEGATIVE
Protein, ur: NEGATIVE mg/dL
Specific Gravity, Urine: 1.014 (ref 1.005–1.030)
pH: 7 (ref 5.0–8.0)

## 2020-06-08 LAB — PREGNANCY, URINE: Preg Test, Ur: NEGATIVE

## 2020-06-08 LAB — LIPASE, BLOOD: Lipase: 30 U/L (ref 11–51)

## 2020-06-08 MED ORDER — SODIUM CHLORIDE 0.9 % IV BOLUS
1000.0000 mL | Freq: Once | INTRAVENOUS | Status: AC
  Administered 2020-06-08: 1000 mL via INTRAVENOUS

## 2020-06-08 MED ORDER — ALUM & MAG HYDROXIDE-SIMETH 200-200-20 MG/5ML PO SUSP
30.0000 mL | Freq: Once | ORAL | Status: AC
  Administered 2020-06-08: 30 mL via ORAL
  Filled 2020-06-08: qty 30

## 2020-06-08 MED ORDER — AMLODIPINE BESYLATE 5 MG PO TABS
5.0000 mg | ORAL_TABLET | Freq: Once | ORAL | Status: AC
  Administered 2020-06-08: 5 mg via ORAL
  Filled 2020-06-08: qty 1

## 2020-06-08 MED ORDER — AMLODIPINE BESYLATE 2.5 MG PO TABS: 2.5000 mg | ORAL_TABLET | Freq: Every day | ORAL | 0 refills | Status: DC

## 2020-06-08 MED ORDER — MORPHINE SULFATE (PF) 4 MG/ML IV SOLN
4.0000 mg | Freq: Once | INTRAVENOUS | Status: AC
  Administered 2020-06-08: 4 mg via INTRAVENOUS
  Filled 2020-06-08: qty 1

## 2020-06-08 MED ORDER — PANTOPRAZOLE SODIUM 40 MG IV SOLR
40.0000 mg | Freq: Once | INTRAVENOUS | Status: AC
Start: 2020-06-08 — End: 2020-06-08
  Administered 2020-06-08: 40 mg via INTRAVENOUS
  Filled 2020-06-08: qty 40

## 2020-06-08 MED ORDER — IOHEXOL 300 MG/ML  SOLN
100.0000 mL | Freq: Once | INTRAMUSCULAR | Status: AC | PRN
  Administered 2020-06-08: 100 mL via INTRAVENOUS

## 2020-06-08 MED ORDER — ONDANSETRON HCL 4 MG/2ML IJ SOLN
4.0000 mg | Freq: Once | INTRAMUSCULAR | Status: AC
  Administered 2020-06-08: 4 mg via INTRAVENOUS
  Filled 2020-06-08: qty 2

## 2020-06-08 MED ORDER — LIDOCAINE VISCOUS HCL 2 % MT SOLN
15.0000 mL | Freq: Once | OROMUCOSAL | Status: AC
  Administered 2020-06-08: 15 mL via ORAL
  Filled 2020-06-08: qty 15

## 2020-06-08 NOTE — Discharge Instructions (Addendum)
Suspect you have cholelithiasis you are scheduled for an ultrasound tomorrow at 10 AM please come for your appointment.  I have started you on blood pressure medications please take as prescribed.  I would like to follow-up with general surgery for further evaluation of your cholelithiasis please call the physician above.  given you the contact admission for [facility] and wellness please call they will help you find a primary care provider.  Come back to the emergency department if you develop chest pain, shortness of breath, severe abdominal pain, uncontrolled nausea, vomiting, diarrhea.

## 2020-06-08 NOTE — ED Provider Notes (Signed)
Patient is a 40 year old female whose care was transferred me at shift change from [facility]  His HPI is below:  Patient with significant medical history of anxiety, hypertension, kidney stones presents to the emergency department with chief complaint of right upper quadrant pain and epigastric pain.  Patient endorses pain started on Saturday, and has progressively gotten worse.  She describes it as a sharp sensation which she feels in her epigastric region and radiates into her right upper quadrant, associated with nausea and vomiting, as well as diarrhea, she denies constipation, urinary symptoms, hematuria, hematochezia, hematemesis.  She has a history of kidney stones but states this feels nothing like a kidney stone, she has no history of stomach ulcers, denies alcohol or NSAID use, denies smoking.  She states that she was recently seen at [facility] ED on Saturday where they told her that she has gallstones and advised her to follow-up with general surgery.  She has been unable to do so.  She has no other significant abdominal history.  Patient denies  alleviating factors.  Patient denies headaches, fevers, chills, shortness of breath, chest pain, urinary symptoms, worsening pedal edema. Physical Exam  BP (!) 206/116   Pulse 80   Temp 99 F (37.2 C) (Oral)   Resp 14   LMP 06/02/2020   SpO2 100%   Physical Exam Physical Exam Updated Vital Signs BP (!) 206/116   Pulse 80   Temp 99 F (37.2 C) (Oral)   Resp 14   LMP 06/02/2020   SpO2 100%   Physical Exam Vitals and nursing note reviewed.  Constitutional:      General: She is not in acute distress.    Appearance: She is not ill-appearing.  HENT:     Head: Normocephalic and atraumatic.     Nose: No congestion.  Eyes:     Conjunctiva/sclera: Conjunctivae normal.  Cardiovascular:     Rate and Rhythm: Normal rate and regular rhythm.     Pulses: Normal pulses.     Heart sounds: No murmur heard. No friction rub. No gallop.    Pulmonary:     Effort: No respiratory distress.     Breath sounds: No wheezing, rhonchi or rales.  Abdominal:     Palpations: Abdomen is soft.     Tenderness: There is abdominal tenderness. There is no right CVA tenderness or left CVA tenderness.     Comments: Patient abdomen is visualized, nondistended, normoactive bowel sounds, dull to percussion.  She is tender to palpation in her epigastric region slightly tender right upper quadrant, negative Murphy sign, negative McBurney point, no rebound tenderness or peritoneal sign present.  No CVA tenderness.  Musculoskeletal:     Right lower leg: No edema.     Left lower leg: No edema.  Skin:    General: Skin is warm and dry.  Neurological:     Mental Status: She is alert.  Psychiatric:        Mood and Affect: Mood normal.  ED Course/Procedures     Procedures  MDM   Patient is a 40 year old female whose care was transferred me at shift change from [facility].  Patient pending CT scan.  CT scan of the abdomen and pelvis obtained with findings as noted below:  IMPRESSION:  Cholelithiasis in decompressed gallbladder. No inflammatory changes  are noted.    Septated left ovarian cyst as described. This is similar to that  seen on the exam from 2 days previous although decreased in size  from  a prior exam from 2017.    No other focal abnormality is noted.   Patient reassessed and she is feeling much better.  Ultrasound has been ordered for tomorrow morning and patient is going to return to the emergency department to have this obtained.  Patient also given follow-up with surgery.  She was noted to be significantly hypertensive at today's visit and patient notes that she used to take 2.5 mg amlodipine for hypertension but has not taken this medication for many months.  She was given 5 mg of amlodipine and her blood pressure has reduced to 174/85.  Patient given a new prescription for 2.5 mg amlodipine.  Feel that patient is stable  for discharge at this time and she is agreeable.  Her questions were answered and she was amicable at the time of discharge.       Placido Sou, PA-C 06/08/20 2241    Terrilee Files, MD 06/09/20 1036

## 2020-06-08 NOTE — ED Notes (Signed)
Patient transported to CT 

## 2020-06-08 NOTE — ED Triage Notes (Signed)
States she has a history of gallstones and has pain in abdomen

## 2020-06-08 NOTE — ED Provider Notes (Signed)
[facility] EMERGENCY DEPARTMENT Provider Note   CSN: [id number] Arrival date & time: 06/08/20  1657     History Chief Complaint  Patient presents with  . Abdominal Pain    Sara Ramirez is a 40 y.o. female.  HPI   Patient with significant medical history of anxiety, hypertension, kidney stones presents to the emergency department with chief complaint of right upper quadrant pain and epigastric pain.  Patient endorses pain started on Saturday, and has progressively gotten worse.  She describes it as a sharp sensation which she feels in her epigastric region and radiates into her right upper quadrant, associated with nausea and vomiting, as well as diarrhea, she denies constipation, urinary symptoms, hematuria, hematochezia, hematemesis.  She has a history of kidney stones but states this feels nothing like a kidney stone, she has no history of stomach ulcers, denies alcohol or NSAID use, denies smoking.  She states that she was recently seen at [facility] ED on Saturday where they told her that she has gallstones and advised her to follow-up with general surgery.  She has been unable to do so.  She has no other significant abdominal history.  Patient denies  alleviating factors.  Patient denies headaches, fevers, chills, shortness of breath, chest pain, urinary symptoms, worsening pedal edema.  Past Medical History:  Diagnosis Date  . Anxiety   . Hypertension   . Kidney stone   . Migraines   . Mitral regurgitation   . Renal disorder    kidney stones  . Tricuspid regurgitation     Patient Active Problem List   Diagnosis Date Noted  . B12 deficiency 07/24/2015  . Uncontrolled hypertension 07/22/2015  . Morbid obesity (HCC) 07/22/2015  . Palpitations 07/22/2015  . Other chest pain 10/02/2014  . Indigestion 10/02/2014  . Situational anxiety 10/02/2014  . Migraine headache 11/19/2013  . Essential hypertension 11/19/2013  . BMI 50.0-59.9, adult (HCC) 11/19/2013    Past  Surgical History:  Procedure Laterality Date  . CYSTOSCOPY W/ URETERAL STENT PLACEMENT Right 01/03/2013   Procedure: CYSTOSCOPY WITH RIGHT RETROGRADE PYELOGRAM/ RIGHT URETERAL STENT PLACEMENT;  Surgeon: Martina Sinner, MD;  Location: [facility];  Service: Urology;  Laterality: Right;  . LITHOTRIPSY       OB History    Gravida  1   Para      Term      Preterm      AB      Living  1     SAB      IAB      Ectopic      Multiple      Live Births              Family History  Problem Relation Age of Onset  . Diabetes Father   . Migraines Mother   . Heart disease Paternal Grandmother   . Stroke Paternal Grandmother   . Migraines Son   . Diabetes Maternal Grandfather     Social History   Tobacco Use  . Smoking status: Never Smoker  . Smokeless tobacco: Never Used  Substance Use Topics  . Alcohol use: No    Alcohol/week: 0.0 standard drinks  . Drug use: No    Home Medications Prior to Admission medications   Medication Sig Start Date End Date Taking? Authorizing Provider  amLODipine (NORVASC) 2.5 MG tablet Take 1 tablet (2.5 mg total) by mouth daily. 06/08/20 07/08/20 Yes Carroll Sage, PA-C  amoxicillin (AMOXIL) 500 MG capsule Take 1  capsule (500 mg total) by mouth 3 (three) times daily. 10/22/16   Devoria Albe, MD  HYDROcodone-acetaminophen (NORCO/VICODIN) 5-325 MG tablet Take 1 tablet by mouth every 6 (six) hours as needed for moderate pain. Patient not taking: Reported on 03/30/2016 02/05/16   Bethann Berkshire, MD  ondansetron (ZOFRAN) 4 MG tablet Take 1 tablet (4 mg total) by mouth every 8 (eight) hours as needed for nausea or vomiting. 03/30/16   Raeford Razor, MD    Allergies    Imitrex [sumatriptan] and Toradol [ketorolac tromethamine]  Review of Systems   Review of Systems  Constitutional: Negative for chills and fever.  HENT: Negative for congestion and sore throat.   Respiratory: Negative for shortness of breath.   Cardiovascular: Negative for  chest pain.  Gastrointestinal: Positive for abdominal pain, diarrhea, nausea and vomiting.  Genitourinary: Negative for dyspareunia, dysuria, enuresis, vaginal bleeding and vaginal discharge.  Musculoskeletal: Negative for back pain.  Skin: Negative for rash.  Neurological: Negative for dizziness.  Hematological: Does not bruise/bleed easily.    Physical Exam Updated Vital Signs BP (!) 206/116   Pulse 80   Temp 99 F (37.2 C) (Oral)   Resp 14   LMP 06/02/2020   SpO2 100%   Physical Exam Vitals and nursing note reviewed.  Constitutional:      General: She is not in acute distress.    Appearance: She is not ill-appearing.  HENT:     Head: Normocephalic and atraumatic.     Nose: No congestion.  Eyes:     Conjunctiva/sclera: Conjunctivae normal.  Cardiovascular:     Rate and Rhythm: Normal rate and regular rhythm.     Pulses: Normal pulses.     Heart sounds: No murmur heard. No friction rub. No gallop.   Pulmonary:     Effort: No respiratory distress.     Breath sounds: No wheezing, rhonchi or rales.  Abdominal:     Palpations: Abdomen is soft.     Tenderness: There is abdominal tenderness. There is no right CVA tenderness or left CVA tenderness.     Comments: Patient abdomen is visualized, nondistended, normoactive bowel sounds, dull to percussion.  She is tender to palpation in her epigastric region slightly tender right upper quadrant, negative Murphy sign, negative McBurney point, no rebound tenderness or peritoneal sign present.  No CVA tenderness.  Musculoskeletal:     Right lower leg: No edema.     Left lower leg: No edema.  Skin:    General: Skin is warm and dry.  Neurological:     Mental Status: She is alert.  Psychiatric:        Mood and Affect: Mood normal.     ED Results / Procedures / Treatments   Labs (all labs ordered are listed, but only abnormal results are displayed) Labs Reviewed  COMPREHENSIVE METABOLIC PANEL - Abnormal; Notable for the  following components:      Result Value   Calcium 8.7 (*)    All other components within normal limits  LIPASE, BLOOD  PREGNANCY, URINE  URINALYSIS, ROUTINE W REFLEX MICROSCOPIC  CBC WITH DIFFERENTIAL/PLATELET  CBC WITH DIFFERENTIAL/PLATELET    EKG EKG Interpretation  Date/Time:  Monday June 08 2020 20:47:18 EST Ventricular Rate:  79 PR Interval:    QRS Duration: 96 QT Interval:  391 QTC Calculation: 449 R Axis:   59 Text Interpretation: Sinus rhythm Baseline wander in lead(s) V2 rate slower than prior 5/17 Confirmed by Meridee Score [id number]) on 06/08/2020 8:59:29 PM  Radiology CT ABDOMEN PELVIS W CONTRAST  Result Date: 06/08/2020 CLINICAL DATA:  Abdominal pain with nausea and vomiting for several days EXAM: CT ABDOMEN AND PELVIS WITH CONTRAST TECHNIQUE: Multidetector CT imaging of the abdomen and pelvis was performed using the standard protocol following bolus administration of intravenous contrast. CONTRAST:  100mL OMNIPAQUE IOHEXOL 300 MG/ML  SOLN COMPARISON:  06/06/2020 FINDINGS: Lower chest: No acute abnormality. Hepatobiliary: Multiple gallstones are noted in a decompressed gallbladder. Liver is within normal limits. Pancreas: Unremarkable. No pancreatic ductal dilatation or surrounding inflammatory changes. Spleen: Normal in size without focal abnormality. Adrenals/Urinary Tract: Adrenal glands are unremarkable. Kidneys demonstrate a normal enhancement pattern. No obstructive changes are seen. The bladder is partially distended. Stomach/Bowel: The appendix is within normal limits. No obstructive or inflammatory changes of the colon are seen. Small bowel and stomach are within normal limits. Vascular/Lymphatic: No significant vascular findings are present. No enlarged abdominal or pelvic lymph nodes. Reproductive: Uterus is within normal limits. Right ovary is unremarkable. Left ovary demonstrates a somewhat septated cystic lesion measuring 5.2 x 5.3 cm similar to that seen on prior  exam. Other: No abdominal wall hernia or abnormality. No abdominopelvic ascites. Musculoskeletal: No acute or significant osseous findings. IMPRESSION: Cholelithiasis in decompressed gallbladder. No inflammatory changes are noted. Septated left ovarian cyst as described. This is similar to that seen on the exam from 2 days previous although decreased in size from a prior exam from 2017. No other focal abnormality is noted. Electronically Signed   By: Alcide CleverMark  Lukens M.D.   On: 06/08/2020 20:50   DG Chest Port 1 View  Result Date: 06/08/2020 CLINICAL DATA:  Epigastric and right-sided flank pain EXAM: PORTABLE CHEST 1 VIEW COMPARISON:  12/17/2018 FINDINGS: The heart size and mediastinal contours are within normal limits. Both lungs are clear. The visualized skeletal structures are unremarkable. IMPRESSION: No active disease. Electronically Signed   By: Sharlet SalinaMichael  Brown M.D.   On: 06/08/2020 17:46    Procedures Procedures   Medications Ordered in ED Medications  amLODipine (NORVASC) tablet 5 mg (has no administration in time range)  sodium chloride 0.9 % bolus 1,000 mL (0 mLs Intravenous Stopped 06/08/20 2034)  ondansetron (ZOFRAN) injection 4 mg (4 mg Intravenous Given 06/08/20 1902)  alum & mag hydroxide-simeth (MAALOX/MYLANTA) 200-200-20 MG/5ML suspension 30 mL (30 mLs Oral Given 06/08/20 1916)    And  lidocaine (XYLOCAINE) 2 % viscous mouth solution 15 mL (15 mLs Oral Given 06/08/20 1916)  pantoprazole (PROTONIX) injection 40 mg (40 mg Intravenous Given 06/08/20 1906)  morphine 4 MG/ML injection 4 mg (4 mg Intravenous Given 06/08/20 2010)  iohexol (OMNIPAQUE) 300 MG/ML solution 100 mL (100 mLs Intravenous Contrast Given 06/08/20 2031)    ED Course  I have reviewed the triage vital signs and the nursing notes.  Pertinent labs & imaging results that were available during my care of the patient were reviewed by me and considered in my medical decision making (see chart for details).    MDM  Rules/Calculators/A&P                         Initial impression-patient presents with epigastric and right upper quadrant pain.  She is alert, does not appear in acute distress, vital signs reassuring.  Will obtain abdominal lab work-up,  Work-up-CBC unremarkable, CMP unremarkable, lipase 30, portable chest negative for acute findings.  UA negative for acute findings.  Urine pregnancy is negative.  CT abdomen pelvis shows cholelithiasis and a decompressed gallbladder  no inflammatory changes noted.  Does have a left ovarian cyst similar to previous scans.  Reassessment patient reassessed at providing her with GI cocktail continues to have epigastric and right quadrant pain.  Low suspicion for cholecystitis unfortunately ultrasound is not available at this time we will send patient down for CT abdomen pelvis for further  evaluation.  Patient reassessed after providing her with morphine and obtain her on her CT scan.  She states she feels better, denies any nausea or vomiting.  Her blood pressure continues to elevated, she endorses that she has not been taking her amlodipine.  She is having no neurodeficits on my exam.  Will provide her with 5 mg of amlodipine in trend blood pressure.  Rule out-low suspicion for CVA as patient denies headaches, change in vision, paresthesias or weakness upper lower extremities, no neurodeficits on my exam.  Will defer imaging at this time.  Low suspicion for hypertensive emergency as there is no signs of organ damage at this time.  I suspect elevated blood pressure secondary due to pain as well as noncompliance with her medication. will provide her with 5 mg of amlodipine and continue to trend.  Low suspicion for cholecystitis as patient is nontoxic-appearing, no leukocytosis seen on CBC, CT imaging does not reveal inflammation around the area.  Does show Coley lithiasis.  Low suspicion for UTI or pyelonephritis as patient denies urinary symptoms, no CVA tenderness neuro  exam, UA is negative for signs of infection or hematuria.  Low suspicion for perforated stomach ulcer as there is no peritoneal signs on my exam, abdomen is nondistended soft nontender to palpation.  Plan-I suspect patient suffering from cholelithiasis, will have her follow-up tomorrow for ultrasound for further evaluation.  We will also have her follow-up with general surgery for evaluation of her cholelithiasis.  Suspect her blood pressure is secondary due to noncompliance, will provide her with 1 month of amlodipine and have her follow-up with PCP.  Due to shift change patient we handed off to [facility] he was provided HPI, current work-up, likely disposition.  As long as blood pressure is trending down and she is having no deficits patient be discharged home.     Final Clinical Impression(s) / ED Diagnoses Final diagnoses:  Calculus of gallbladder without cholecystitis without obstruction  Secondary hypertension    Rx / DC Orders ED Discharge Orders         Ordered    US Abdomen Limited RUQ/Gall Gladder        06/08/20 1926    amLODipine (NORVASC) 2.5 MG tablet  Daily        06/08/20 2112           Carroll Sage, PA-C 06/08/20 2128    Terrilee Files, MD 06/09/20 1036

## 2020-06-09 ENCOUNTER — Encounter (HOSPITAL_COMMUNITY): Payer: Self-pay | Admitting: *Deleted

## 2020-06-09 ENCOUNTER — Ambulatory Visit (HOSPITAL_COMMUNITY)
Admission: RE | Admit: 2020-06-09 | Discharge: 2020-06-09 | Disposition: A | Discharge status: Home / Self Care | Payer: Self-pay | Source: Ambulatory Visit | Attending: Diagnostic Radiology | Admitting: Diagnostic Radiology

## 2020-06-09 DIAGNOSIS — K8020 Calculus of gallbladder without cholecystitis without obstruction: Secondary | ICD-10-CM | POA: Insufficient documentation

## 2020-06-09 DIAGNOSIS — R1011 Right upper quadrant pain: Secondary | ICD-10-CM

## 2020-06-09 DIAGNOSIS — K760 Fatty (change of) liver, not elsewhere classified: Secondary | ICD-10-CM | POA: Insufficient documentation

## 2020-06-15 ENCOUNTER — Encounter (HOSPITAL_COMMUNITY): Payer: Self-pay | Admitting: *Deleted

## 2020-06-15 ENCOUNTER — Inpatient Hospital Stay (HOSPITAL_COMMUNITY)
Admission: EM | Admit: 2020-06-15 | Discharge: 2020-06-17 | DRG: 418 | Disposition: A | Discharge status: Home / Self Care | Payer: Medicaid Other | Attending: General Surgery | Admitting: General Surgery

## 2020-06-15 ENCOUNTER — Other Ambulatory Visit: Payer: Self-pay

## 2020-06-15 DIAGNOSIS — Z6841 Body Mass Index (BMI) 40.0 and over, adult: Secondary | ICD-10-CM | POA: Diagnosis not present

## 2020-06-15 DIAGNOSIS — Z888 Allergy status to other drugs, medicaments and biological substances status: Secondary | ICD-10-CM | POA: Diagnosis not present

## 2020-06-15 DIAGNOSIS — I10 Essential (primary) hypertension: Secondary | ICD-10-CM | POA: Diagnosis present

## 2020-06-15 DIAGNOSIS — R112 Nausea with vomiting, unspecified: Secondary | ICD-10-CM

## 2020-06-15 DIAGNOSIS — Z9119 Patient's noncompliance with other medical treatment and regimen: Secondary | ICD-10-CM

## 2020-06-15 DIAGNOSIS — K760 Fatty (change of) liver, not elsewhere classified: Secondary | ICD-10-CM | POA: Diagnosis present

## 2020-06-15 DIAGNOSIS — R109 Unspecified abdominal pain: Secondary | ICD-10-CM

## 2020-06-15 DIAGNOSIS — Z8249 Family history of ischemic heart disease and other diseases of the circulatory system: Secondary | ICD-10-CM

## 2020-06-15 DIAGNOSIS — K8050 Calculus of bile duct without cholangitis or cholecystitis without obstruction: Secondary | ICD-10-CM | POA: Diagnosis present

## 2020-06-15 DIAGNOSIS — K8020 Calculus of gallbladder without cholecystitis without obstruction: Secondary | ICD-10-CM

## 2020-06-15 DIAGNOSIS — Z87442 Personal history of urinary calculi: Secondary | ICD-10-CM

## 2020-06-15 DIAGNOSIS — I081 Rheumatic disorders of both mitral and tricuspid valves: Secondary | ICD-10-CM | POA: Diagnosis present

## 2020-06-15 DIAGNOSIS — Z20822 Contact with and (suspected) exposure to covid-19: Secondary | ICD-10-CM | POA: Diagnosis present

## 2020-06-15 DIAGNOSIS — E876 Hypokalemia: Secondary | ICD-10-CM | POA: Diagnosis present

## 2020-06-15 DIAGNOSIS — R1011 Right upper quadrant pain: Secondary | ICD-10-CM | POA: Diagnosis not present

## 2020-06-15 DIAGNOSIS — R1013 Epigastric pain: Secondary | ICD-10-CM

## 2020-06-15 DIAGNOSIS — K8012 Calculus of gallbladder with acute and chronic cholecystitis without obstruction: Principal | ICD-10-CM | POA: Diagnosis present

## 2020-06-15 DIAGNOSIS — K8000 Calculus of gallbladder with acute cholecystitis without obstruction: Secondary | ICD-10-CM | POA: Diagnosis not present

## 2020-06-15 LAB — CBC WITH DIFFERENTIAL/PLATELET
Abs Immature Granulocytes: 0.03 10*3/uL (ref 0.00–0.07)
Basophils Absolute: 0.1 10*3/uL (ref 0.0–0.1)
Basophils Relative: 1 %
Eosinophils Absolute: 0.1 10*3/uL (ref 0.0–0.5)
Eosinophils Relative: 1 %
HCT: 40.4 % (ref 36.0–46.0)
Hemoglobin: 13.4 g/dL (ref 12.0–15.0)
Immature Granulocytes: 0 %
Lymphocytes Relative: 24 %
Lymphs Abs: 2.3 10*3/uL (ref 0.7–4.0)
MCH: 30.5 pg (ref 26.0–34.0)
MCHC: 33.2 g/dL (ref 30.0–36.0)
MCV: 92 fL (ref 80.0–100.0)
Monocytes Absolute: 0.5 10*3/uL (ref 0.1–1.0)
Monocytes Relative: 5 %
Neutro Abs: 6.7 10*3/uL (ref 1.7–7.7)
Neutrophils Relative %: 69 %
Platelets: 358 10*3/uL (ref 150–400)
RBC: 4.39 MIL/uL (ref 3.87–5.11)
RDW: 13.9 % (ref 11.5–15.5)
WBC: 9.6 10*3/uL (ref 4.0–10.5)
nRBC: 0 % (ref 0.0–0.2)

## 2020-06-15 LAB — COMPREHENSIVE METABOLIC PANEL
ALT: 29 U/L (ref 0–44)
AST: 23 U/L (ref 15–41)
Albumin: 4.2 g/dL (ref 3.5–5.0)
Alkaline Phosphatase: 103 U/L (ref 38–126)
Anion gap: 11 (ref 5–15)
BUN: 13 mg/dL (ref 6–20)
CO2: 25 mmol/L (ref 22–32)
Calcium: 9 mg/dL (ref 8.9–10.3)
Chloride: 100 mmol/L (ref 98–111)
Creatinine, Ser: 0.91 mg/dL (ref 0.44–1.00)
GFR, Estimated: >60 mL/min (ref >60)
Glucose, Bld: 109 mg/dL — ABNORMAL HIGH (ref 70–99)
Potassium: 3.2 mmol/L — ABNORMAL LOW (ref 3.5–5.1)
Sodium: 136 mmol/L (ref 135–145)
Total Bilirubin: 0.7 mg/dL (ref 0.3–1.2)
Total Protein: 8.1 g/dL (ref 6.5–8.1)

## 2020-06-15 LAB — RESP PANEL BY RT-PCR (FLU A&B, COVID) ARPGX2
Influenza A by PCR: NEGATIVE
Influenza B by PCR: NEGATIVE
SARS Coronavirus 2 by RT PCR: NEGATIVE

## 2020-06-15 LAB — LIPASE, BLOOD: Lipase: 32 U/L (ref 11–51)

## 2020-06-15 MED ORDER — FENTANYL CITRATE (PF) 100 MCG/2ML IJ SOLN
50.0000 ug | Freq: Once | INTRAMUSCULAR | Status: AC
Start: 2020-06-15 — End: 2020-06-15
  Administered 2020-06-15: 50 ug via INTRAVENOUS
  Filled 2020-06-15: qty 2

## 2020-06-15 MED ORDER — ONDANSETRON HCL 4 MG/2ML IJ SOLN
4.0000 mg | Freq: Once | INTRAMUSCULAR | Status: AC
  Administered 2020-06-15: 4 mg via INTRAVENOUS
  Filled 2020-06-15: qty 2

## 2020-06-15 NOTE — ED Notes (Addendum)
Updated patient and mother about visitation policy. All questions answered at this time.  Mother stated that she would be back to visit with patient in the morning and that patient's husband will be in after dropping the kids off at school.

## 2020-06-15 NOTE — ED Provider Notes (Signed)
[facility] EMERGENCY DEPARTMENT Provider Note   CSN: [id number] Arrival date & time: 06/15/20  1923     History Chief Complaint  Patient presents with  . Abdominal Pain    Sara Ramirez is a 40 y.o. female.  HPI Patient presents abdominal pain.  Had been diagnosed about a week ago with gallstones.  Had an ultrasound that showed gallstones and some gallbladder wall thickening.  States a couple days ago began to have more pain.  Some nausea vomiting diarrhea.  Pain is in the upper abdomen.  Does feel as if goes to the back.  States she did have some ground beef nachos.  Has not followed up with a surgeon yet.  No fevers.  Has not had COVID vaccines.    Past Medical History:  Diagnosis Date  . Anxiety   . Hypertension   . Kidney stone   . Migraines   . Mitral regurgitation   . Renal disorder    kidney stones  . Tricuspid regurgitation     Patient Active Problem List   Diagnosis Date Noted  . B12 deficiency 07/24/2015  . Uncontrolled hypertension 07/22/2015  . Morbid obesity (HCC) 07/22/2015  . Palpitations 07/22/2015  . Other chest pain 10/02/2014  . Indigestion 10/02/2014  . Situational anxiety 10/02/2014  . Migraine headache 11/19/2013  . Essential hypertension 11/19/2013  . BMI 50.0-59.9, adult (HCC) 11/19/2013    Past Surgical History:  Procedure Laterality Date  . CYSTOSCOPY W/ URETERAL STENT PLACEMENT Right 01/03/2013   Procedure: CYSTOSCOPY WITH RIGHT RETROGRADE PYELOGRAM/ RIGHT URETERAL STENT PLACEMENT;  Surgeon: Martina Sinner, MD;  Location: [facility];  Service: Urology;  Laterality: Right;  . LITHOTRIPSY       OB History    Gravida  1   Para  0   Term  0   Preterm  0   AB  0   Living        SAB  0   IAB  0   Ectopic  0   Multiple      Live Births              Family History  Problem Relation Age of Onset  . Diabetes Father   . Migraines Mother   . Heart disease Paternal Grandmother   . Stroke Paternal Grandmother   .  Migraines Son   . Diabetes Maternal Grandfather     Social History   Tobacco Use  . Smoking status: Never Smoker  . Smokeless tobacco: Never Used  Substance Use Topics  . Alcohol use: No    Alcohol/week: 0.0 standard drinks  . Drug use: No    Home Medications Prior to Admission medications   Medication Sig Start Date End Date Taking? Authorizing Provider  amLODipine (NORVASC) 2.5 MG tablet Take 1 tablet (2.5 mg total) by mouth daily. 06/08/20 07/08/20  Carroll Sage, PA-C  amoxicillin (AMOXIL) 500 MG capsule Take 1 capsule (500 mg total) by mouth 3 (three) times daily. 10/22/16   Devoria Albe, MD  HYDROcodone-acetaminophen (NORCO/VICODIN) 5-325 MG tablet Take 1 tablet by mouth every 6 (six) hours as needed for moderate pain. Patient not taking: Reported on 03/30/2016 02/05/16   Bethann Berkshire, MD  ondansetron (ZOFRAN) 4 MG tablet Take 1 tablet (4 mg total) by mouth every 8 (eight) hours as needed for nausea or vomiting. 03/30/16   Raeford Razor, MD    Allergies    Imitrex [sumatriptan] and Toradol [ketorolac tromethamine]  Review of Systems  Review of Systems  Constitutional: Negative for appetite change.  HENT: Negative for congestion.   Respiratory: Negative for shortness of breath.   Gastrointestinal: Positive for abdominal pain, diarrhea, nausea and vomiting.  Genitourinary: Negative for flank pain.  Musculoskeletal: Negative for back pain.  Skin: Negative for rash.  Neurological: Negative for weakness.  Psychiatric/Behavioral: Negative for confusion.    Physical Exam Updated Vital Signs BP (!) 158/90 (BP Location: Right Arm)   Pulse 94   Temp 97.9 F (36.6 C) (Oral)   Resp 17   Ht 5\' 3"  (1.6 m)   Wt 125.6 kg   LMP 06/02/2020   SpO2 99%   BMI 49.07 kg/m   Physical Exam Vitals and nursing note reviewed.  Constitutional:      Appearance: She is obese.  HENT:     Head: Atraumatic.  Cardiovascular:     Rate and Rhythm: Regular rhythm. Tachycardia present.   Abdominal:     Hernia: No hernia is present.     Comments: Upper abdominal tenderness without rebounding or guarding.  Skin:    General: Skin is warm.     Capillary Refill: Capillary refill takes less than 2 seconds.  Neurological:     Mental Status: She is alert and oriented to person, place, and time.  Psychiatric:        Mood and Affect: Mood normal.     ED Results / Procedures / Treatments   Labs (all labs ordered are listed, but only abnormal results are displayed) Labs Reviewed  COMPREHENSIVE METABOLIC PANEL - Abnormal; Notable for the following components:      Result Value   Potassium 3.2 (*)    Glucose, Bld 109 (*)    All other components within normal limits  RESP PANEL BY RT-PCR (FLU A&B, COVID) ARPGX2  LIPASE, BLOOD  CBC WITH DIFFERENTIAL/PLATELET  PREGNANCY, URINE    EKG None  Radiology No results found.  Procedures Procedures   Medications Ordered in ED Medications  fentaNYL (SUBLIMAZE) injection 50 mcg (50 mcg Intravenous Given 06/15/20 2042)  ondansetron (ZOFRAN) injection 4 mg (4 mg Intravenous Given 06/15/20 2042)    ED Course  I have reviewed the triage vital signs and the nursing notes.  Pertinent labs & imaging results that were available during my care of the patient were reviewed by me and considered in my medical decision making (see chart for details).    MDM Rules/Calculators/A&P                          Patient with abdominal pain.  Similar to recent pain and diagnosed with gallstones.  Did have some gallbladder wall thickening potentially on ultrasound earlier this week.  Had been doing better but then pain returned.  Upper abdomen.  Lab work reassuring but is worrisome for biliary colic that is either uncontrolled or potentially have progressed to acute cholecystitis.  Discussed with Dr. 2043.  Thinks patient benefit from medical admission to the hospital.  Will see tomorrow to evaluate whether needs further imaging such as  ultrasound or HIDA scan or potentially surgery.  Feeling somewhat better after pain medicines.  Initial differential diagnosis included cholecystitis biliary colic constipation bowel obstruction. Final Clinical Impression(s) / ED Diagnoses Final diagnoses:  Biliary colic    Rx / DC Orders ED Discharge Orders    None       Henreitta Leber, MD 06/15/20 2238

## 2020-06-15 NOTE — H&P (Signed)
History and Physical  Sara Ramirez DVV:[id number] DOB: 03-30-1981 DOA: 06/15/2020  Referring physician: Benjiman Core, MD PCP: Patient, No Pcp Per  Patient coming from: Home  Chief Complaint: Abdominal pain  HPI: Sara Ramirez is a 40 y.o. female with medical history significant for hypertension who presents to the emergency department accompanied by mother due to more than 1 week onset of abdominal pain associated with nausea without vomiting.  She states that she went to [facility] ED at [facility], CT abdomen and pelvis was done and she was told that she had gallstones and discharged home with pain medication.  Patient states that she continues to have abdominal pain, so she presented to the ED on 06/08/2020 and CT abdomen and pelvis with contrast done at that time showed cholelithiasis, she was treated with IV morphine, Protonix, GI cocktail and IV Zofran and discharged home, she return to the ED the following day (3/8) and right upper quadrant ultrasound was done and it showed cholelithiasis with gallbladder wall thickening and no biliary dilatation.  She returned this afternoon due to same abdominal pain in epigastric area and with radiation to the back, this was associated with nausea and vomiting, pain was rated as 10/10 on pain scale, there was no alleviating/aggravating factor.  ED Course:  In the emergency department, initial BP was 219/116, but this improved to 137/78 s/p abdominal pain medication.  Work-up in the ED showed normal CBC, hypokalemia.  SARS coronavirus 2 was negative, lipase 32. She was treated with IV fentanyl 50 mcg x 1 and IV Zofran 4 mg x 1.  General surgery (Dr. Henreitta Leber) was consulted and will see patient in the morning per ED physician.  Hospitalist was asked to admit patient for further evaluation and management.  Review of Systems: Constitutional: Negative for chills and fever.  HENT: Negative for ear pain and sore throat.   Eyes: Negative for pain and visual  disturbance.  Respiratory: Negative for cough, chest tightness and shortness of breath.   Cardiovascular: Negative for chest pain and palpitations.  Gastrointestinal: Positive for abdominal pain, nausea and vomiting.  Endocrine: Negative for polyphagia and polyuria.  Genitourinary: Negative for decreased urine volume, dysuria, enuresis, hematuria Musculoskeletal: Negative for arthralgias and back pain.  Skin: Negative for color change and rash.  Allergic/Immunologic: Negative for immunocompromised state.  Neurological: Negative for tremors, syncope, speech difficulty, weakness, light-headedness and headaches.  Hematological: Does not bruise/bleed easily.  All other systems reviewed and are negative   Past Medical History:  Diagnosis Date  . Anxiety   . Hypertension   . Kidney stone   . Migraines   . Mitral regurgitation   . Renal disorder    kidney stones  . Tricuspid regurgitation    Past Surgical History:  Procedure Laterality Date  . CYSTOSCOPY W/ URETERAL STENT PLACEMENT Right 01/03/2013   Procedure: CYSTOSCOPY WITH RIGHT RETROGRADE PYELOGRAM/ RIGHT URETERAL STENT PLACEMENT;  Surgeon: Martina Sinner, MD;  Location: [facility];  Service: Urology;  Laterality: Right;  . LITHOTRIPSY      Social History:  reports that she has never smoked. She has never used smokeless tobacco. She reports that she does not drink alcohol and does not use drugs.   Allergies  Allergen Reactions  . Imitrex [Sumatriptan] Nausea Only  . Toradol [Ketorolac Tromethamine] Nausea And Vomiting    Family History  Problem Relation Age of Onset  . Diabetes Father   . Migraines Mother   . Heart disease Paternal Grandmother   . Stroke  Paternal Grandmother   . Migraines Son   . Diabetes Maternal Grandfather     Prior to Admission medications   Medication Sig Start Date End Date Taking? Authorizing Provider  amLODipine (NORVASC) 2.5 MG tablet Take 1 tablet (2.5 mg total) by mouth daily. 06/08/20 07/08/20   Carroll Sage, PA-C  amoxicillin (AMOXIL) 500 MG capsule Take 1 capsule (500 mg total) by mouth 3 (three) times daily. 10/22/16   Devoria Albe, MD  HYDROcodone-acetaminophen (NORCO/VICODIN) 5-325 MG tablet Take 1 tablet by mouth every 6 (six) hours as needed for moderate pain. Patient not taking: Reported on 03/30/2016 02/05/16   Bethann Berkshire, MD  ondansetron (ZOFRAN) 4 MG tablet Take 1 tablet (4 mg total) by mouth every 8 (eight) hours as needed for nausea or vomiting. 03/30/16   Raeford Razor, MD    Physical Exam: BP (!) 168/96 (BP Location: Left Arm)   Pulse 88   Temp 98.3 F (36.8 C)   Resp 19   Ht 5\' 3"  (1.6 m)   Wt 125.7 kg   LMP 06/02/2020   SpO2 96%   BMI 49.09 kg/m   . General: 40 y.o. year-old female obese but in no acute distress.  Alert and oriented x3. 30 HEENT: NCAT, EOMI . Neck: Supple, trachea medial . Cardiovascular: Regular rate and rhythm with no rubs or gallops.  No thyromegaly or JVD noted.  No lower extremity edema. 2/4 pulses in all 4 extremities. Marland Kitchen Respiratory: Clear to auscultation with no wheezes or rales. Good inspiratory effort. . Abdomen: Soft, tender to palpation in the epigastric and RUQ area with no guarding.  Normal bowel sounds x4 quadrants. . Muskuloskeletal: No cyanosis, clubbing or edema noted bilaterally . Neuro: CN II-XII intact, strength 5/5 x 4, sensation, reflexes intact . Skin: No ulcerative lesions noted or rashes . Psychiatry: Judgement and insight appear normal. Mood is appropriate for condition and setting          Labs on Admission:  Basic Metabolic Panel: Recent Labs  Lab 06/15/20 2041  NA 136  K 3.2*  CL 100  CO2 25  GLUCOSE 109*  BUN 13  CREATININE 0.91  CALCIUM 9.0   Liver Function Tests: Recent Labs  Lab 06/15/20 2041  AST 23  ALT 29  ALKPHOS 103  BILITOT 0.7  PROT 8.1  ALBUMIN 4.2   Recent Labs  Lab 06/15/20 2041  LIPASE 32   No results for input(s): AMMONIA in the last 168 hours. CBC: Recent  Labs  Lab 06/15/20 2041  WBC 9.6  NEUTROABS 6.7  HGB 13.4  HCT 40.4  MCV 92.0  PLT 358   Cardiac Enzymes: No results for input(s): CKTOTAL, CKMB, CKMBINDEX, TROPONINI in the last 168 hours.  BNP (last 3 results) No results for input(s): BNP in the last 8760 hours.  ProBNP (last 3 results) No results for input(s): PROBNP in the last 8760 hours.  CBG: No results for input(s): GLUCAP in the last 168 hours.  Radiological Exams on Admission: No results found.  EKG: I independently viewed the EKG done and my findings are as followed: EKG was not done in the ED   Assessment/Plan Present on Admission: . Biliary colic . Essential hypertension . Morbid obesity (HCC)  Principal Problem:   Cholelithiasis Active Problems:   Essential hypertension   Morbid obesity (HCC)   Biliary colic   Abdominal pain   Nausea & vomiting  Abdominal pain, nausea and vomiting due to biliary colic secondary to acute cholelithiasis Continue IV  NS at 100 mLs/Hr Continue IV morphine 2 mg q.4h p.r.n. for moderate to severe pain Continue IV Zofran p.r.n. Continue clear liquid diet with plan to advance diet as tolerated Surgery  will be consulted to follow up with patient in the morning  Essential hypertension Continue amlodipine per home regimen  Morbid obesity (BMI 49.09) Patient counseled on diet and lifestyle modification   DVT prophylaxis: SCDs (consider chemoprophylaxis if patient does not have to undergo any surgical intervention)  Code Status: Full code  Family Communication: Mom at bedside (all questions answered to satisfaction)  Disposition Plan:  Patient is from:                        home Anticipated DC to:                   SNF or family members home Anticipated DC date:               2-3 days Anticipated DC barriers:           Patient requires inpatient management of abdominal pain with  pending surgical consult to determine need for surgical intervention   Consults  called: General surgery  Admission status: Inpatient    Frankey Shown MD [facility]  06/16/2020, 1:38 AM

## 2020-06-15 NOTE — ED Triage Notes (Addendum)
Pt seen for abd pain on 3/7 and dx with gallstones.  Pt with continued pain.  + N/V/D.

## 2020-06-16 ENCOUNTER — Telehealth: Payer: Self-pay

## 2020-06-16 ENCOUNTER — Encounter (HOSPITAL_COMMUNITY): Payer: Self-pay | Admitting: Internal Medicine

## 2020-06-16 DIAGNOSIS — K8020 Calculus of gallbladder without cholecystitis without obstruction: Secondary | ICD-10-CM

## 2020-06-16 DIAGNOSIS — R112 Nausea with vomiting, unspecified: Secondary | ICD-10-CM

## 2020-06-16 DIAGNOSIS — R109 Unspecified abdominal pain: Secondary | ICD-10-CM

## 2020-06-16 DIAGNOSIS — K8000 Calculus of gallbladder with acute cholecystitis without obstruction: Secondary | ICD-10-CM

## 2020-06-16 LAB — COMPREHENSIVE METABOLIC PANEL
ALT: 26 U/L (ref 0–44)
AST: 22 U/L (ref 15–41)
Albumin: 3.3 g/dL — ABNORMAL LOW (ref 3.5–5.0)
Alkaline Phosphatase: 81 U/L (ref 38–126)
Anion gap: 9 (ref 5–15)
BUN: 11 mg/dL (ref 6–20)
CO2: 24 mmol/L (ref 22–32)
Calcium: 8.4 mg/dL — ABNORMAL LOW (ref 8.9–10.3)
Chloride: 106 mmol/L (ref 98–111)
Creatinine, Ser: 0.76 mg/dL (ref 0.44–1.00)
GFR, Estimated: >60 mL/min (ref >60)
Glucose, Bld: 100 mg/dL — ABNORMAL HIGH (ref 70–99)
Potassium: 3.4 mmol/L — ABNORMAL LOW (ref 3.5–5.1)
Sodium: 139 mmol/L (ref 135–145)
Total Bilirubin: 0.5 mg/dL (ref 0.3–1.2)
Total Protein: 6.5 g/dL (ref 6.5–8.1)

## 2020-06-16 LAB — MAGNESIUM: Magnesium: 2 mg/dL (ref 1.7–2.4)

## 2020-06-16 LAB — CBC
HCT: 35.9 % — ABNORMAL LOW (ref 36.0–46.0)
Hemoglobin: 11.8 g/dL — ABNORMAL LOW (ref 12.0–15.0)
MCH: 30.4 pg (ref 26.0–34.0)
MCHC: 32.9 g/dL (ref 30.0–36.0)
MCV: 92.5 fL (ref 80.0–100.0)
Platelets: 324 10*3/uL (ref 150–400)
RBC: 3.88 MIL/uL (ref 3.87–5.11)
RDW: 13.9 % (ref 11.5–15.5)
WBC: 7.8 10*3/uL (ref 4.0–10.5)
nRBC: 0 % (ref 0.0–0.2)

## 2020-06-16 LAB — PROTIME-INR
INR: 1 (ref 0.8–1.2)
Prothrombin Time: 12.7 seconds (ref 11.4–15.2)

## 2020-06-16 LAB — APTT: aPTT: 27 seconds (ref 24–36)

## 2020-06-16 LAB — PHOSPHORUS: Phosphorus: 2.9 mg/dL (ref 2.5–4.6)

## 2020-06-16 LAB — HIV ANTIBODY (ROUTINE TESTING W REFLEX): HIV Screen 4th Generation wRfx: NONREACTIVE

## 2020-06-16 LAB — PREGNANCY, URINE: Preg Test, Ur: NEGATIVE

## 2020-06-16 MED ORDER — SODIUM CHLORIDE 0.9 % IV SOLN: Freq: Once | INTRAVENOUS | Status: AC

## 2020-06-16 MED ORDER — CHLORHEXIDINE GLUCONATE CLOTH 2 % EX PADS
6.0000 | MEDICATED_PAD | Freq: Once | CUTANEOUS | Status: AC
  Administered 2020-06-16: 6 via TOPICAL

## 2020-06-16 MED ORDER — SODIUM CHLORIDE 0.9 % IV SOLN
2.0000 g | INTRAVENOUS | Status: DC
  Administered 2020-06-16: 2 g via INTRAVENOUS
  Filled 2020-06-16 (×2): qty 20

## 2020-06-16 MED ORDER — SODIUM CHLORIDE 0.9 % IV SOLN
2.0000 g | INTRAVENOUS | Status: AC
  Administered 2020-06-17: 2 g via INTRAVENOUS
  Filled 2020-06-16: qty 2

## 2020-06-16 MED ORDER — POTASSIUM CHLORIDE IN NACL 20-0.9 MEQ/L-% IV SOLN: INTRAVENOUS | Status: DC

## 2020-06-16 MED ORDER — MORPHINE SULFATE (PF) 2 MG/ML IV SOLN
2.0000 mg | INTRAVENOUS | Status: DC | PRN
  Administered 2020-06-16 (×2): 2 mg via INTRAVENOUS
  Filled 2020-06-16 (×3): qty 1

## 2020-06-16 MED ORDER — ONDANSETRON HCL 4 MG/2ML IJ SOLN
4.0000 mg | Freq: Four times a day (QID) | INTRAMUSCULAR | Status: DC | PRN
  Administered 2020-06-16 (×2): 4 mg via INTRAVENOUS
  Filled 2020-06-16 (×2): qty 2

## 2020-06-16 MED ORDER — AMLODIPINE BESYLATE 5 MG PO TABS
2.5000 mg | ORAL_TABLET | Freq: Every day | ORAL | Status: DC
  Administered 2020-06-16 – 2020-06-17 (×2): 2.5 mg via ORAL
  Filled 2020-06-16 (×2): qty 1

## 2020-06-16 MED ORDER — POTASSIUM CHLORIDE CRYS ER 10 MEQ PO TBCR
10.0000 meq | EXTENDED_RELEASE_TABLET | Freq: Once | ORAL | Status: AC
  Administered 2020-06-16: 10 meq via ORAL
  Filled 2020-06-16: qty 1

## 2020-06-16 NOTE — H&P (View-Only) (Signed)
I was present with the medical student for this service. I personally verified the history of present illness, performed the physical exam, and made the plan for this encounter. I have verified the medical student's documentation and made modifications where appropriately. I have personally documented in my own words a brief history, physical, and plan below.     Continues with pain and has stones on CT and US. Normal LFTs. likely acute on chronic cholecystitis given symptoms. Has been to the hospital three times in the last week. Will proceed with surgery. Diet ok today, NPO, Antibiotic for cholecystitis now. Personally reviewed imaging and distended gallbladder with stones, thickened wall on US.   PLAN: I counseled the patient about the indication, risks and benefits of laparoscopic cholecystectomy.  She understands there is a very small chance for bleeding, infection, injury to normal structures (including common bile duct), conversion to open surgery, persistent symptoms, evolution of postcholecystectomy diarrhea, need for secondary interventions, anesthesia reaction, cardiopulmonary issues and other risks not specifically detailed here. I described the expected recovery, the plan for follow-up and the restrictions during the recovery phase.  All questions were answered.  Algis GreenhouseLindsay Dominigue Gellner, MD [facility] [address] [place], [place] [id number] [phone] (office)    [facility] Consult  Reason for Consult: Cholelithiasis Referring Physician: Frankey ShownAdefeso, Oladapo, MD   Chief Complaint    Abdominal Pain      HPI: Sara Ramirez is a 40 y.o. female with a PMH of HTN who presented to the ED yesterday with RUQ and epigastric pain. CT showed cholelithiasis. She has had multiple episodes of this pain with accompanying ED visits. She states the pain was 10/10 when she came to the ED and radiated to her back. Her pain the last two episodes over  the weekend were provoked after eating french fries and [place] food with a lot of cheese. Today, her pain is 0/10 but 3/10 upon palpation. She has been on a clear diet and voiding well. Yesterday before coming to the ED, she had 4-5 episodes of diarrhea. She is able to move without pain.  Past Medical History:  Diagnosis Date  . Anxiety   . Hypertension   . Kidney stone   . Migraines   . Mitral regurgitation   . Renal disorder    kidney stones  . Tricuspid regurgitation     Past Surgical History:  Procedure Laterality Date  . CYSTOSCOPY W/ URETERAL STENT PLACEMENT Right 01/03/2013   Procedure: CYSTOSCOPY WITH RIGHT RETROGRADE PYELOGRAM/ RIGHT URETERAL STENT PLACEMENT;  Surgeon: Martina SinnerScott A MacDiarmid, MD;  Location: [facility];  Service: Urology;  Laterality: Right;  . LITHOTRIPSY      Family History  Problem Relation Age of Onset  . Diabetes Father   . Migraines Mother   . Heart disease Paternal Grandmother   . Stroke Paternal Grandmother   . Migraines Son   . Diabetes Maternal Grandfather     Social History   Tobacco Use  . Smoking status: Never Smoker  . Smokeless tobacco: Never Used  Substance Use Topics  . Alcohol use: No    Alcohol/week: 0.0 standard drinks  . Drug use: No    Medications: I have reviewed the patient's current medications.  Allergies  Allergen Reactions  . Imitrex [Sumatriptan] Nausea Only  . Toradol [Ketorolac Tromethamine] Nausea And Vomiting     ROS:  Eyes: negative for redness and visual disturbance Respiratory: negative for cough, dyspnea on exertion, pleurisy/chest pain, stridor and wheezing  Cardiovascular: negative for chest pain, chest pressure/discomfort, dyspnea, irregular heart beat, lower extremity edema and orthopnea Gastrointestinal: positive for abdominal pain and diarrhea Musculoskeletal:negative for arthralgias and back pain Neurological: negative for dizziness, speech problems and weakness Behavioral/Psych: negative for  aggressive behavior and bad mood  Blood pressure (!) 146/71, pulse 68, temperature 98.3 F (36.8 C), temperature source Oral, resp. rate 18, height 5\' 3"  (1.6 m), weight 125.7 kg, last menstrual period 06/02/2020, SpO2 99 %. Physical Exam Constitutional:      Appearance: She is well-developed.  HENT:     Head: Normocephalic.  Eyes:     Extraocular Movements: Extraocular movements intact.  Cardiovascular:     Rate and Rhythm: Normal rate and regular rhythm.     Heart sounds: Normal heart sounds.  Pulmonary:     Effort: Pulmonary effort is normal.     Breath sounds: Normal breath sounds.  Abdominal:     General: Bowel sounds are normal. There is no distension.     Palpations: Abdomen is soft.     Tenderness: There is abdominal tenderness in the right upper quadrant and epigastric area.     Hernia: No hernia is present.  Musculoskeletal:     Comments: Moves all extremities   Skin:    General: Skin is warm.  Neurological:     General: No focal deficit present.     Mental Status: She is alert and oriented to person, place, and time.  Psychiatric:        Mood and Affect: Mood normal.        Behavior: Behavior normal.     Results: Results for orders placed or performed during the hospital encounter of 06/15/20 (from the past 48 hour(s))  Comprehensive metabolic panel     Status: Abnormal   Collection Time: 06/15/20  8:41 PM  Result Value Ref Range   Sodium 136 135 - 145 mmol/L   Potassium 3.2 (L) 3.5 - 5.1 mmol/L   Chloride 100 98 - 111 mmol/L   CO2 25 22 - 32 mmol/L   Glucose, Bld 109 (H) 70 - 99 mg/dL    Comment: Glucose reference range applies only to samples taken after fasting for at least 8 hours.   BUN 13 6 - 20 mg/dL   Creatinine, Ser 06/17/20 0.44 - 1.00 mg/dL   Calcium 9.0 8.9 - 4.19 mg/dL   Total Protein 8.1 6.5 - 8.1 g/dL   Albumin 4.2 3.5 - 5.0 g/dL   AST 23 15 - 41 U/L   ALT 29 0 - 44 U/L   Alkaline Phosphatase 103 38 - 126 U/L   Total Bilirubin 0.7 0.3 - 1.2  mg/dL   GFR, Estimated 62.2 >29 mL/min    Comment: (NOTE) Calculated using the CKD-EPI Creatinine Equation (2021)    Anion gap 11 5 - 15    Comment: Performed at [facility], [address], [place], [place]  Lipase, blood     Status: None   Collection Time: 06/15/20  8:41 PM  Result Value Ref Range   Lipase 32 11 - 51 U/L    Comment: Performed at [facility], [address], [place], [place]  CBC with Differential     Status: None   Collection Time: 06/15/20  8:41 PM  Result Value Ref Range   WBC 9.6 4.0 - 10.5 K/uL   RBC 4.39 3.87 - 5.11 MIL/uL   Hemoglobin 13.4 12.0 - 15.0 g/dL   HCT 06/17/20 08.1 - 44.8 %  MCV 92.0 80.0 - 100.0 fL   MCH 30.5 26.0 - 34.0 pg   MCHC 33.2 30.0 - 36.0 g/dL   RDW 22.9 79.8 - 92.1 %   Platelets 358 150 - 400 K/uL   nRBC 0.0 0.0 - 0.2 %   Neutrophils Relative % 69 %   Neutro Abs 6.7 1.7 - 7.7 K/uL   Lymphocytes Relative 24 %   Lymphs Abs 2.3 0.7 - 4.0 K/uL   Monocytes Relative 5 %   Monocytes Absolute 0.5 0.1 - 1.0 K/uL   Eosinophils Relative 1 %   Eosinophils Absolute 0.1 0.0 - 0.5 K/uL   Basophils Relative 1 %   Basophils Absolute 0.1 0.0 - 0.1 K/uL   Immature Granulocytes 0 %   Abs Immature Granulocytes 0.03 0.00 - 0.07 K/uL    Comment: Performed at [facility], [address], [place], [place] [address]  Resp Panel by RT-PCR (Flu A&B, Covid) Nasopharyngeal Swab     Status: None   Collection Time: 06/15/20  8:47 PM   Specimen: Nasopharyngeal Swab; Nasopharyngeal(NP) swabs in vial transport medium  Result Value Ref Range   SARS Coronavirus 2 by RT PCR NEGATIVE NEGATIVE    Comment: (NOTE) SARS-CoV-2 target nucleic acids are NOT DETECTED.  The SARS-CoV-2 RNA is generally detectable in upper respiratory specimens during the acute phase of infection. The lowest concentration of SARS-CoV-2 viral copies this assay can detect is 138 copies/mL. A negative result does not preclude SARS-Cov-2 infection and should not be used as  the sole basis for treatment or other patient management decisions. A negative result may occur with  improper specimen collection/handling, submission of specimen other than nasopharyngeal swab, presence of viral mutation(s) within the areas targeted by this assay, and inadequate number of viral copies(<138 copies/mL). A negative result must be combined with clinical observations, patient history, and epidemiological information. The expected result is Negative.  Fact Sheet for Patients:  BloggerCourse.com  Fact Sheet for Healthcare Providers:  SeriousBroker.it  This test is no t yet approved or cleared by the [place] FDA and  has been authorized for detection and/or diagnosis of SARS-CoV-2 by FDA under an Emergency Use Authorization (EUA). This EUA will remain  in effect (meaning this test can be used) for the duration of the COVID-19 declaration under Section 564(b)(1) of the Act, 21 U.S.C.section 360bbb-3(b)(1), unless the authorization is terminated  or revoked sooner.       Influenza A by PCR NEGATIVE NEGATIVE   Influenza B by PCR NEGATIVE NEGATIVE    Comment: (NOTE) The Xpert Xpress SARS-CoV-2/FLU/RSV plus assay is intended as an aid in the diagnosis of influenza from Nasopharyngeal swab specimens and should not be used as a sole basis for treatment. Nasal washings and aspirates are unacceptable for Xpert Xpress SARS-CoV-2/FLU/RSV testing.  Fact Sheet for Patients: BloggerCourse.com  Fact Sheet for Healthcare Providers: SeriousBroker.it  This test is not yet approved or cleared by the [place] FDA and has been authorized for detection and/or diagnosis of SARS-CoV-2 by FDA under an Emergency Use Authorization (EUA). This EUA will remain in effect (meaning this test can be used) for the duration of the COVID-19 declaration under Section 564(b)(1) of the Act, 21  U.S.C. section 360bbb-3(b)(1), unless the authorization is terminated or revoked.  Performed at [facility], [address], [place], [place] [address]   Pregnancy, urine     Status: None   Collection Time: 06/16/20 12:16 AM  Result Value Ref Range   Preg Test, Ur  NEGATIVE NEGATIVE    Comment:        THE SENSITIVITY OF THIS METHODOLOGY IS >20 mIU/mL. Performed at [facility], [address] [place], [place] [address]   Comprehensive metabolic panel     Status: Abnormal   Collection Time: 06/16/20  6:41 AM  Result Value Ref Range   Sodium 139 135 - 145 mmol/L   Potassium 3.4 (L) 3.5 - 5.1 mmol/L   Chloride 106 98 - 111 mmol/L   CO2 24 22 - 32 mmol/L   Glucose, Bld 100 (H) 70 - 99 mg/dL    Comment: Glucose reference range applies only to samples taken after fasting for at least 8 hours.   BUN 11 6 - 20 mg/dL   Creatinine, Ser 0.76 0.44 - 1.00 mg/dL   Calcium 8.4 (L) 8.9 - 10.3 mg/dL   Total Protein 6.5 6.5 - 8.1 g/dL   Albumin 3.3 (L) 3.5 - 5.0 g/dL   AST 22 15 - 41 U/L   ALT 26 0 - 44 U/L   Alkaline Phosphatase 81 38 - 126 U/L   Total Bilirubin 0.5 0.3 - 1.2 mg/dL   GFR, Estimated >60 >60 mL/min    Comment: (NOTE) Calculated using the CKD-EPI Creatinine Equation (2021)    Anion gap 9 5 - 15    Comment: Performed at [facility], [address] [place], [place] [address]  CBC     Status: Abnormal   Collection Time: 06/16/20  6:41 AM  Result Value Ref Range   WBC 7.8 4.0 - 10.5 K/uL   RBC 3.88 3.87 - 5.11 MIL/uL   Hemoglobin 11.8 (L) 12.0 - 15.0 g/dL   HCT 35.9 (L) 36.0 - 46.0 %   MCV 92.5 80.0 - 100.0 fL   MCH 30.4 26.0 - 34.0 pg   MCHC 32.9 30.0 - 36.0 g/dL   RDW 13.9 11.5 - 15.5 %   Platelets 324 150 - 400 K/uL   nRBC 0.0 0.0 - 0.2 %    Comment: Performed at [facility], [address] [place], [place] [address]  Protime-INR     Status: None   Collection Time: 06/16/20  6:41 AM  Result Value Ref Range   Prothrombin Time 12.7 11.4 - 15.2 seconds   INR 1.0 0.8  - 1.2    Comment: (NOTE) INR goal varies based on device and disease states. Performed at [facility], [address] [place], [place] [address]   APTT     Status: None   Collection Time: 06/16/20  6:41 AM  Result Value Ref Range   aPTT 27 24 - 36 seconds    Comment: Performed at [facility], [address] [place], [place] [address]  Magnesium     Status: None   Collection Time: 06/16/20  6:41 AM  Result Value Ref Range   Magnesium 2.0 1.7 - 2.4 mg/dL    Comment: Performed at  Hospital, [address] [place], [place] [address]  Phosphorus     Status: None   Collection Time: 06/16/20  6:41 AM  Result Value Ref Range   Phosphorus 2.9 2.5 - 4.6 mg/dL    Comment: Performed at  Hospital, [address] , [place] [address]    CLINICAL DATA:  Abdominal pain with nausea and vomiting for several days  EXAM: CT ABDOMEN AND PELVIS WITH CONTRAST  TECHNIQUE: Multidetector CT imaging of the abdomen and pelvis was performed using the standard protocol following bolus administration of intravenous contrast.  CONTRAST:    OMNIPAQUE IOHEXOL 300 MG/ML  SOLN  COMPARISON:  06/06/2020  FINDINGS: Lower chest: No acute abnormality.  Hepatobiliary: Multiple gallstones are noted in a decompressed gallbladder. Liver is within normal limits.  Pancreas: Unremarkable. No pancreatic ductal dilatation or surrounding inflammatory changes.  Spleen: Normal in size without focal abnormality.  Adrenals/Urinary Tract: Adrenal glands are unremarkable. Kidneys demonstrate a normal enhancement pattern. No obstructive changes are seen. The bladder is partially distended.  Stomach/Bowel: The appendix is within normal limits. No obstructive or inflammatory changes of the colon are seen. Small bowel and stomach are within normal limits.  Vascular/Lymphatic: No significant vascular findings are present. No enlarged abdominal or pelvic lymph nodes.  Reproductive: Uterus  is within normal limits. Right ovary is unremarkable. Left ovary demonstrates a somewhat septated cystic lesion measuring 5.2 x 5.3 cm similar to that seen on prior exam.  Other: No abdominal wall hernia or abnormality. No abdominopelvic ascites.  Musculoskeletal: No acute or significant osseous findings.  IMPRESSION: Cholelithiasis in decompressed gallbladder. No inflammatory changes are noted.  Septated left ovarian cyst as described. This is similar to that seen on the exam from 2 days previous although decreased in size from a prior exam from 2017.  No other focal abnormality is noted.   Electronically Signed   By: Alcide Clever M.D.   On: 06/08/2020 20:50  CLINICAL DATA:  Right upper quadrant pain 3 days  EXAM: ULTRASOUND ABDOMEN LIMITED RIGHT UPPER QUADRANT  COMPARISON:  CT abdomen 06/08/2020  FINDINGS: Gallbladder:  Gallbladder is filled with calcified gallstones as noted on CT. There is dense shadowing. Negative sonographic Murphy sign. Gallstones measure up to 10 mm. Gallbladder wall thickened 8.8 mm.  Common bile duct:  Diameter: 4.2 mm  Liver:  Echogenic liver parenchyma compatible fatty infiltration without focal liver lesion. Portal vein is patent on color Doppler imaging with normal direction of blood flow towards the liver.  Other: None.  IMPRESSION: Cholelithiasis with gallbladder wall thickening. No biliary dilatation  Fatty liver.   Electronically Signed   By: Marlan Palau M.D.   On: 06/09/2020 10:27   Assessment & Plan:  SHANNYN JANKOWIAK is a 40 y.o. female with a PMH of HTN who presents for evaluation of cholelithiasis on CT. Her physical exam is notable for RUQ tenderness to palpation. Labs are notable for no leukocytosis, increased LFTs, or increased lipase.  -Continue IV NS, morphine and zofran prn -Can transition to soft diet until midnight tonight, then NPO -Will proceed with laparoscopic cholecystectomy  tomorrow morning, 3/16 due to symptomatic cholelithiasis -Begin IV ceftriaxone prophylaxis for surgery  All questions were answered to the satisfaction of the patient and family.  Avie Arenas Haseman 06/16/2020, 9:12 AM

## 2020-06-16 NOTE — Progress Notes (Signed)
PROGRESS NOTE  Sara Ramirez W[facility]:[id number] DOB: [birth year] DOA: 06/15/2020 PCP: Patient, No Pcp Per  Brief History:  40 year old female with a history of hypertension presenting with over 1 week history of epigastric, right upper quadrant pain with associated nausea and vomiting.  The patient initially went to [facility] ED on 06/06/2020.  CT of the abdomen and pelvis was done at that time and showed cholelithiasis with a hazy gallbladder contour with suggestion of gallbladder wall thickening and trace pericholecystic fluid.  Apparently, the patient was seen by general surgery at that time who had recommended that she come back for RUQ [place], and did not feel that the patient needed any emergent surgical intervention.  The patient was discharged home from the emergency department at that time.  The patient describes her pain as intermittent/colicky with waves of nausea.  She subsequently came to the [facility] emergency department with similar complaints on 06/08/2020.  CT of the abdomen and pelvis on 06/08/2020 showed cholelithiasis with decompressed gallbladder without inflammatory changes.  She came back to [facility] on 06/09/2020 for RUQ ultrasound which showed cholelithiasis with gallbladder wall thickening, but no biliary ductal dilatation.  There was fatty liver.  She was not contacted by any results.  She continued to have colicky abdominal pain which significantly worsened on 06/15/2020 with nausea and vomiting.  As result, she presented for further evaluation.  The patient denied any fevers, chills, chest pain, short of breath, cough, hemoptysis, hematemesis, hematochezia, melena.  Interestingly, the patient states that she has had loose stools for the past month without any hematochezia or melena. In the emergency department, the patient had low-grade temperature of 99.1 F.  She was hemodynamically stable with oxygen saturation of 99% on room air.  WBC 9.6, hemoglobin 13.4, placed 258,000.  BMP showed  sodium 136, potassium 3.2, serum creatinine 0.91 with normal LFTs.  Total bilirubin 0.7.  General surgery was consulted to assist with management.  Assessment/Plan: Chronic cholecystitis/symptomatic cholelithiasis -General surgery consult -Clear liquid diet for now -Judicious opioids -IV fluids  Essential hypertension -Patient has previously been on amlodipine which has been restarted -She quit taking her previous antihypertensives in the past because she felt they were making her feel "bad"  Hypokalemia -Replete -Magnesium 2.0  Morbid obesity -BMI 49.09 -Lifestyle modification    Status is: Inpatient  Remains inpatient appropriate because:IV treatments appropriate due to intensity of illness or inability to take PO   Dispo: The patient is from: Home              Anticipated d/c is to: Home              Patient currently is not medically stable to d/c.   Difficult to place patient No        Family Communication:   No Family at bedside  Consultants:  General surgery  Code Status:  FULL  DVT Prophylaxis:   [place] Lovenox   Procedures: As Listed in Progress Note Above  Antibiotics: None       Subjective: Patient states abd pain is controlled.  Denies f/c, cp, sob, n/v/d, dysuria, hematochezia, melena  Objective: Vitals:   06/16/20 0100 06/16/20 0121 06/16/20 0128 06/16/20 0535  BP: 137/78 (!) 168/96  (!) 146/71  Pulse: 95 88  68  Resp: 18 19  18   Temp:  98.3 F (36.8 C)  98.3 F (36.8 C)  TempSrc:    Oral  SpO2: 99% 96%  99%  Weight:   125.7 kg   Height:   5\' 3"  (1.6 m)     Intake/Output Summary (Last 24 hours) at 06/16/2020 0830 Last data filed at 06/16/2020 0400 Gross per 24 hour  Intake 76.67 ml  Output --  Net 76.67 ml   Weight change:  Exam:   General:  Pt is alert, follows commands appropriately, not in acute distress  HEENT: No icterus, No thrush, No neck mass, [place]/AT  Cardiovascular: RRR, S1/S2, no rubs, no  gallops  Respiratory: CTA bilaterally, no wheezing, no crackles, no rhonchi  Abdomen: Soft/+BS, non tender, non distended, no guarding  Extremities: No edema, No lymphangitis, No petechiae, No rashes, no synovitis   Data Reviewed: I have personally reviewed following labs and imaging studies Basic Metabolic Panel: Recent Labs  Lab 06/15/20 2041 06/16/20 0641  NA 136 139  K 3.2* 3.4*  CL 100 106  CO2 25 24  GLUCOSE 109* 100*  BUN 13 11  CREATININE 0.91 0.76  CALCIUM 9.0 8.4*  MG  --  2.0  PHOS  --  2.9   Liver Function Tests: Recent Labs  Lab 06/15/20 2041 06/16/20 0641  AST 23 22  ALT 29 26  ALKPHOS 103 81  BILITOT 0.7 0.5  PROT 8.1 6.5  ALBUMIN 4.2 3.3*   Recent Labs  Lab 06/15/20 2041  LIPASE 32   No results for input(s): AMMONIA in the last 168 hours. Coagulation Profile: Recent Labs  Lab 06/16/20 0641  INR 1.0   CBC: Recent Labs  Lab 06/15/20 2041 06/16/20 0641  WBC 9.6 7.8  NEUTROABS 6.7  --   HGB 13.4 11.8*  HCT 40.4 35.9*  MCV 92.0 92.5  PLT 358 324   Cardiac Enzymes: No results for input(s): CKTOTAL, CKMB, CKMBINDEX, TROPONINI in the last 168 hours. BNP: Invalid input(s): POCBNP CBG: No results for input(s): GLUCAP in the last 168 hours. HbA1C: No results for input(s): HGBA1C in the last 72 hours. Urine analysis:    Component Value Date/Time   COLORURINE YELLOW 06/08/2020 1900   APPEARANCEUR CLEAR 06/08/2020 1900   LABSPEC 1.014 06/08/2020 1900   PHURINE 7.0 06/08/2020 1900   GLUCOSEU NEGATIVE 06/08/2020 1900   HGBUR NEGATIVE 06/08/2020 1900   BILIRUBINUR NEGATIVE 06/08/2020 1900   BILIRUBINUR n 11/19/2013 1053   KETONESUR NEGATIVE 06/08/2020 1900   PROTEINUR NEGATIVE 06/08/2020 1900   UROBILINOGEN 0.2 11/19/2013 1053   UROBILINOGEN 1.0 01/11/2013 0413   NITRITE NEGATIVE 06/08/2020 1900   LEUKOCYTESUR NEGATIVE 06/08/2020 1900   Sepsis Labs: @LABRCNTIP (procalcitonin:4,lacticidven:4) ) Recent Results (from the past 240  hour(s))  Resp Panel by RT-PCR (Flu A&B, Covid) Nasopharyngeal Swab     Status: None   Collection Time: 06/15/20  8:47 PM   Specimen: Nasopharyngeal Swab; Nasopharyngeal(NP) swabs in vial transport medium  Result Value Ref Range Status   SARS Coronavirus 2 by RT PCR NEGATIVE NEGATIVE Final    Comment: (NOTE) SARS-CoV-2 target nucleic acids are NOT DETECTED.  The SARS-CoV-2 RNA is generally detectable in upper respiratory specimens during the acute phase of infection. The lowest concentration of SARS-CoV-2 viral copies this assay can detect is 138 copies/mL. A negative result does not preclude SARS-Cov-2 infection and should not be used as the sole basis for treatment or other patient management decisions. A negative result may occur with  improper specimen collection/handling, submission of specimen other than nasopharyngeal swab, presence of viral mutation(s) within the areas targeted by this assay, and inadequate number of viral copies(<138 copies/mL). A negative  result must be combined with clinical observations, patient history, and epidemiological information. The expected result is Negative.  Fact Sheet for Patients:  BloggerCourse.comhttps://www.fda.gov/media/152166/download  Fact Sheet for Healthcare Providers:  SeriousBroker.ithttps://www.fda.gov/media/152162/download  This test is no t yet approved or cleared by the [place] FDA and  has been authorized for detection and/or diagnosis of SARS-CoV-2 by FDA under an Emergency Use Authorization (EUA). This EUA will remain  in effect (meaning this test can be used) for the duration of the COVID-19 declaration under Section 564(b)(1) of the Act, 21 U.S.C.section 360bbb-3(b)(1), unless the authorization is terminated  or revoked sooner.       Influenza A by PCR NEGATIVE NEGATIVE Final   Influenza B by PCR NEGATIVE NEGATIVE Final    Comment: (NOTE) The Xpert Xpress SARS-CoV-2/FLU/RSV plus assay is intended as an aid in the diagnosis of influenza from  Nasopharyngeal swab specimens and should not be used as a sole basis for treatment. Nasal washings and aspirates are unacceptable for Xpert Xpress SARS-CoV-2/FLU/RSV testing.  Fact Sheet for Patients: BloggerCourse.comhttps://www.fda.gov/media/152166/download  Fact Sheet for Healthcare Providers: SeriousBroker.ithttps://www.fda.gov/media/152162/download  This test is not yet approved or cleared by the [place] FDA and has been authorized for detection and/or diagnosis of SARS-CoV-2 by FDA under an Emergency Use Authorization (EUA). This EUA will remain in effect (meaning this test can be used) for the duration of the COVID-19 declaration under Section 564(b)(1) of the Act, 21 U.S.C. section 360bbb-3(b)(1), unless the authorization is terminated or revoked.  Performed at [facility], [address] [place], [place] [address]      Scheduled Meds: Continuous Infusions:  Procedures/Studies: CT ABDOMEN PELVIS W CONTRAST  Result Date: 06/08/2020 CLINICAL DATA:  Abdominal pain with nausea and vomiting for several days EXAM: CT ABDOMEN AND PELVIS WITH CONTRAST TECHNIQUE: Multidetector CT imaging of the abdomen and pelvis was performed using the standard protocol following bolus administration of intravenous contrast. CONTRAST:  100mL OMNIPAQUE IOHEXOL 300 MG/ML  SOLN COMPARISON:  06/06/2020 FINDINGS: Lower chest: No acute abnormality. Hepatobiliary: Multiple gallstones are noted in a decompressed gallbladder. Liver is within normal limits. Pancreas: Unremarkable. No pancreatic ductal dilatation or surrounding inflammatory changes. Spleen: Normal in size without focal abnormality. Adrenals/Urinary Tract: Adrenal glands are unremarkable. Kidneys demonstrate a normal enhancement pattern. No obstructive changes are seen. The bladder is partially distended. Stomach/Bowel: The appendix is within normal limits. No obstructive or inflammatory changes of the colon are seen. Small bowel and stomach are within normal limits.  Vascular/Lymphatic: No significant vascular findings are present. No enlarged abdominal or pelvic lymph nodes. Reproductive: Uterus is within normal limits. Right ovary is unremarkable. Left ovary demonstrates a somewhat septated cystic lesion measuring 5.2 x 5.3 cm similar to that seen on prior exam. Other: No abdominal wall hernia or abnormality. No abdominopelvic ascites. Musculoskeletal: No acute or significant osseous findings. IMPRESSION: Cholelithiasis in decompressed gallbladder. No inflammatory changes are noted. Septated left ovarian cyst as described. This is similar to that seen on the exam from 2 days previous although decreased in size from a prior exam from 2017. No other focal abnormality is noted. Electronically Signed   By: Alcide CleverMark  Lukens M.D.   On: 06/08/2020 20:50   DG Chest Port 1 View  Result Date: 06/08/2020 CLINICAL DATA:  Epigastric and right-sided flank pain EXAM: PORTABLE CHEST 1 VIEW COMPARISON:  12/17/2018 FINDINGS: The heart size and mediastinal contours are within normal limits. Both lungs are clear. The visualized skeletal structures are unremarkable. IMPRESSION: No active disease. Electronically Signed  By: Sharlet Salina M.D.   On: 06/08/2020 17:46   US ABDOMEN LIMITED RUQ (LIVER/GB)  Result Date: 06/09/2020 CLINICAL DATA:  Right upper quadrant pain 3 days EXAM: ULTRASOUND ABDOMEN LIMITED RIGHT UPPER QUADRANT COMPARISON:  CT abdomen 06/08/2020 FINDINGS: Gallbladder: Gallbladder is filled with calcified gallstones as noted on CT. There is dense shadowing. Negative sonographic Murphy sign. Gallstones measure up to 10 mm. Gallbladder wall thickened 8.8 mm. Common bile duct: Diameter: 4.2 mm Liver: Echogenic liver parenchyma compatible fatty infiltration without focal liver lesion. Portal vein is patent on color Doppler imaging with normal direction of blood flow towards the liver. Other: None. IMPRESSION: Cholelithiasis with gallbladder wall thickening. No biliary dilatation Fatty  liver. Electronically Signed   By: Marlan Palau M.D.   On: 06/09/2020 10:27    Catarina Hartshorn, DO  [facility]  If 7PM-7AM, please contact night-coverage [email or web address] Password [email or web address] 06/16/2020, 8:30 AM   LOS: 1 day

## 2020-06-16 NOTE — Plan of Care (Signed)

## 2020-06-16 NOTE — Consult Note (Addendum)
I was present with the medical student for this service. I personally verified the history of present illness, performed the physical exam, and made the plan for this encounter. I have verified the medical student's documentation and made modifications where appropriately. I have personally documented in my own words a brief history, physical, and plan below.     Continues with pain and has stones on CT and US. Normal LFTs. likely acute on chronic cholecystitis given symptoms. Has been to the hospital three times in the last week. Will proceed with surgery. Diet ok today, NPO, Antibiotic for cholecystitis now. Personally reviewed imaging and distended gallbladder with stones, thickened wall on US.   PLAN: I counseled the patient about the indication, risks and benefits of laparoscopic cholecystectomy.  She understands there is a very small chance for bleeding, infection, injury to normal structures (including common bile duct), conversion to open surgery, persistent symptoms, evolution of postcholecystectomy diarrhea, need for secondary interventions, anesthesia reaction, cardiopulmonary issues and other risks not specifically detailed here. I described the expected recovery, the plan for follow-up and the restrictions during the recovery phase.  All questions were answered.  Algis GreenhouseLindsay Bridges, MD [facility] [address], [place] [id number] [phone] (office)    [facility] Consult  Reason for Consult: Cholelithiasis Referring Physician: Frankey ShownAdefeso, Oladapo, MD   Chief Complaint    Abdominal Pain      HPI: Sara LinerJerrilynn N Ramirez is a 40 y.o. female with a PMH of HTN who presented to the ED yesterday with RUQ and epigastric pain. CT showed cholelithiasis. She has had multiple episodes of this pain with accompanying ED visits. She states the pain was 10/10 when she came to the ED and radiated to her back. Her pain the last two episodes over  the weekend were provoked after eating french fries and [place] food with a lot of cheese. Today, her pain is 0/10 but 3/10 upon palpation. She has been on a clear diet and voiding well. Yesterday before coming to the ED, she had 4-5 episodes of diarrhea. She is able to move without pain.  Past Medical History:  Diagnosis Date  . Anxiety   . Hypertension   . Kidney stone   . Migraines   . Mitral regurgitation   . Renal disorder    kidney stones  . Tricuspid regurgitation     Past Surgical History:  Procedure Laterality Date  . CYSTOSCOPY W/ URETERAL STENT PLACEMENT Right 01/03/2013   Procedure: CYSTOSCOPY WITH RIGHT RETROGRADE PYELOGRAM/ RIGHT URETERAL STENT PLACEMENT;  Surgeon: Martina SinnerScott A MacDiarmid, MD;  Location: [facility];  Service: Urology;  Laterality: Right;  . LITHOTRIPSY      Family History  Problem Relation Age of Onset  . Diabetes Father   . Migraines Mother   . Heart disease Paternal Grandmother   . Stroke Paternal Grandmother   . Migraines Son   . Diabetes Maternal Grandfather     Social History   Tobacco Use  . Smoking status: Never Smoker  . Smokeless tobacco: Never Used  Substance Use Topics  . Alcohol use: No    Alcohol/week: 0.0 standard drinks  . Drug use: No    Medications: I have reviewed the patient's current medications.  Allergies  Allergen Reactions  . Imitrex [Sumatriptan] Nausea Only  . Toradol [Ketorolac Tromethamine] Nausea And Vomiting     ROS:  Eyes: negative for redness and visual disturbance Respiratory: negative for cough, dyspnea on exertion, pleurisy/chest pain, stridor and wheezing  Cardiovascular: negative for chest pain, chest pressure/discomfort, dyspnea, irregular heart beat, lower extremity edema and orthopnea Gastrointestinal: positive for abdominal pain and diarrhea Musculoskeletal:negative for arthralgias and back pain Neurological: negative for dizziness, speech problems and weakness Behavioral/Psych: negative for  aggressive behavior and bad mood  Blood pressure (!) 146/71, pulse 68, temperature 98.3 F (36.8 C), temperature source Oral, resp. rate 18, height 5\' 3"  (1.6 m), weight 125.7 kg, last menstrual period 06/02/2020, SpO2 99 %. Physical Exam Constitutional:      Appearance: She is well-developed.  HENT:     Head: Normocephalic.  Eyes:     Extraocular Movements: Extraocular movements intact.  Cardiovascular:     Rate and Rhythm: Normal rate and regular rhythm.     Heart sounds: Normal heart sounds.  Pulmonary:     Effort: Pulmonary effort is normal.     Breath sounds: Normal breath sounds.  Abdominal:     General: Bowel sounds are normal. There is no distension.     Palpations: Abdomen is soft.     Tenderness: There is abdominal tenderness in the right upper quadrant and epigastric area.     Hernia: No hernia is present.  Musculoskeletal:     Comments: Moves all extremities   Skin:    General: Skin is warm.  Neurological:     General: No focal deficit present.     Mental Status: She is alert and oriented to person, place, and time.  Psychiatric:        Mood and Affect: Mood normal.        Behavior: Behavior normal.     Results: Results for orders placed or performed during the hospital encounter of 06/15/20 (from the past 48 hour(s))  Comprehensive metabolic panel     Status: Abnormal   Collection Time: 06/15/20  8:41 PM  Result Value Ref Range   Sodium 136 135 - 145 mmol/L   Potassium 3.2 (L) 3.5 - 5.1 mmol/L   Chloride 100 98 - 111 mmol/L   CO2 25 22 - 32 mmol/L   Glucose, Bld 109 (H) 70 - 99 mg/dL    Comment: Glucose reference range applies only to samples taken after fasting for at least 8 hours.   BUN 13 6 - 20 mg/dL   Creatinine, Ser 06/17/20 0.44 - 1.00 mg/dL   Calcium 9.0 8.9 - 4.19 mg/dL   Total Protein 8.1 6.5 - 8.1 g/dL   Albumin 4.2 3.5 - 5.0 g/dL   AST 23 15 - 41 U/L   ALT 29 0 - 44 U/L   Alkaline Phosphatase 103 38 - 126 U/L   Total Bilirubin 0.7 0.3 - 1.2  mg/dL   GFR, Estimated 62.2 >29 mL/min    Comment: (NOTE) Calculated using the CKD-EPI Creatinine Equation (2021)    Anion gap 11 5 - 15    Comment: Performed at [facility], [address], [place], [place]  Lipase, blood     Status: None   Collection Time: 06/15/20  8:41 PM  Result Value Ref Range   Lipase 32 11 - 51 U/L    Comment: Performed at [facility], [address], [place], [place]  CBC with Differential     Status: None   Collection Time: 06/15/20  8:41 PM  Result Value Ref Range   WBC 9.6 4.0 - 10.5 K/uL   RBC 4.39 3.87 - 5.11 MIL/uL   Hemoglobin 13.4 12.0 - 15.0 g/dL   HCT 06/17/20 08.1 - 44.8 %  MCV 92.0 80.0 - 100.0 fL   MCH 30.5 26.0 - 34.0 pg   MCHC 33.2 30.0 - 36.0 g/dL   RDW 22.9 79.8 - 92.1 %   Platelets 358 150 - 400 K/uL   nRBC 0.0 0.0 - 0.2 %   Neutrophils Relative % 69 %   Neutro Abs 6.7 1.7 - 7.7 K/uL   Lymphocytes Relative 24 %   Lymphs Abs 2.3 0.7 - 4.0 K/uL   Monocytes Relative 5 %   Monocytes Absolute 0.5 0.1 - 1.0 K/uL   Eosinophils Relative 1 %   Eosinophils Absolute 0.1 0.0 - 0.5 K/uL   Basophils Relative 1 %   Basophils Absolute 0.1 0.0 - 0.1 K/uL   Immature Granulocytes 0 %   Abs Immature Granulocytes 0.03 0.00 - 0.07 K/uL    Comment: Performed at [facility], [address], [place], [place] [address]  Resp Panel by RT-PCR (Flu A&B, Covid) Nasopharyngeal Swab     Status: None   Collection Time: 06/15/20  8:47 PM   Specimen: Nasopharyngeal Swab; Nasopharyngeal(NP) swabs in vial transport medium  Result Value Ref Range   SARS Coronavirus 2 by RT PCR NEGATIVE NEGATIVE    Comment: (NOTE) SARS-CoV-2 target nucleic acids are NOT DETECTED.  The SARS-CoV-2 RNA is generally detectable in upper respiratory specimens during the acute phase of infection. The lowest concentration of SARS-CoV-2 viral copies this assay can detect is 138 copies/mL. A negative result does not preclude SARS-Cov-2 infection and should not be used as  the sole basis for treatment or other patient management decisions. A negative result may occur with  improper specimen collection/handling, submission of specimen other than nasopharyngeal swab, presence of viral mutation(s) within the areas targeted by this assay, and inadequate number of viral copies(<138 copies/mL). A negative result must be combined with clinical observations, patient history, and epidemiological information. The expected result is Negative.  Fact Sheet for Patients:  BloggerCourse.com  Fact Sheet for Healthcare Providers:  SeriousBroker.it  This test is no t yet approved or cleared by the [place] FDA and  has been authorized for detection and/or diagnosis of SARS-CoV-2 by FDA under an Emergency Use Authorization (EUA). This EUA will remain  in effect (meaning this test can be used) for the duration of the COVID-19 declaration under Section 564(b)(1) of the Act, 21 U.S.C.section 360bbb-3(b)(1), unless the authorization is terminated  or revoked sooner.       Influenza A by PCR NEGATIVE NEGATIVE   Influenza B by PCR NEGATIVE NEGATIVE    Comment: (NOTE) The Xpert Xpress SARS-CoV-2/FLU/RSV plus assay is intended as an aid in the diagnosis of influenza from Nasopharyngeal swab specimens and should not be used as a sole basis for treatment. Nasal washings and aspirates are unacceptable for Xpert Xpress SARS-CoV-2/FLU/RSV testing.  Fact Sheet for Patients: BloggerCourse.com  Fact Sheet for Healthcare Providers: SeriousBroker.it  This test is not yet approved or cleared by the [place] FDA and has been authorized for detection and/or diagnosis of SARS-CoV-2 by FDA under an Emergency Use Authorization (EUA). This EUA will remain in effect (meaning this test can be used) for the duration of the COVID-19 declaration under Section 564(b)(1) of the Act, 21  U.S.C. section 360bbb-3(b)(1), unless the authorization is terminated or revoked.  Performed at [facility], [address], [place], [place] [address]   Pregnancy, urine     Status: None   Collection Time: 06/16/20 12:16 AM  Result Value Ref Range   Preg Test, Ur  NEGATIVE NEGATIVE    Comment:        THE SENSITIVITY OF THIS METHODOLOGY IS >20 mIU/mL. Performed at [facility], [address] [place], [place] [address]   Comprehensive metabolic panel     Status: Abnormal   Collection Time: 06/16/20  6:41 AM  Result Value Ref Range   Sodium 139 135 - 145 mmol/L   Potassium 3.4 (L) 3.5 - 5.1 mmol/L   Chloride 106 98 - 111 mmol/L   CO2 24 22 - 32 mmol/L   Glucose, Bld 100 (H) 70 - 99 mg/dL    Comment: Glucose reference range applies only to samples taken after fasting for at least 8 hours.   BUN 11 6 - 20 mg/dL   Creatinine, Ser 2.95 0.44 - 1.00 mg/dL   Calcium 8.4 (L) 8.9 - 10.3 mg/dL   Total Protein 6.5 6.5 - 8.1 g/dL   Albumin 3.3 (L) 3.5 - 5.0 g/dL   AST 22 15 - 41 U/L   ALT 26 0 - 44 U/L   Alkaline Phosphatase 81 38 - 126 U/L   Total Bilirubin 0.5 0.3 - 1.2 mg/dL   GFR, Estimated >62 >13 mL/min    Comment: (NOTE) Calculated using the CKD-EPI Creatinine Equation (2021)    Anion gap 9 5 - 15    Comment: Performed at [facility], [address], [place], [place] [address]  CBC     Status: Abnormal   Collection Time: 06/16/20  6:41 AM  Result Value Ref Range   WBC 7.8 4.0 - 10.5 K/uL   RBC 3.88 3.87 - 5.11 MIL/uL   Hemoglobin 11.8 (L) 12.0 - 15.0 g/dL   HCT 84.6 (L) 96.2 - 95.2 %   MCV 92.5 80.0 - 100.0 fL   MCH 30.4 26.0 - 34.0 pg   MCHC 32.9 30.0 - 36.0 g/dL   RDW 84.1 32.4 - 40.1 %   Platelets 324 150 - 400 K/uL   nRBC 0.0 0.0 - 0.2 %    Comment: Performed at [facility], [address], [place], [place] [address]  Protime-INR     Status: None   Collection Time: 06/16/20  6:41 AM  Result Value Ref Range   Prothrombin Time 12.7 11.4 - 15.2 seconds   INR 1.0 0.8  - 1.2    Comment: (NOTE) INR goal varies based on device and disease states. Performed at [facility], [address], [place], [place] [address]   APTT     Status: None   Collection Time: 06/16/20  6:41 AM  Result Value Ref Range   aPTT 27 24 - 36 seconds    Comment: Performed at [facility], [address], [place], [place] [address]  Magnesium     Status: None   Collection Time: 06/16/20  6:41 AM  Result Value Ref Range   Magnesium 2.0 1.7 - 2.4 mg/dL    Comment: Performed at [facility], [address] [place], [place] [address]  Phosphorus     Status: None   Collection Time: 06/16/20  6:41 AM  Result Value Ref Range   Phosphorus 2.9 2.5 - 4.6 mg/dL    Comment: Performed at [facility], [address] [place], [place] [address]    CLINICAL DATA:  Abdominal pain with nausea and vomiting for several days  EXAM: CT ABDOMEN AND PELVIS WITH CONTRAST  TECHNIQUE: Multidetector CT imaging of the abdomen and pelvis was performed using the standard protocol following bolus administration of intravenous contrast.  CONTRAST:  OMNIPAQUE IOHEXOL 300 MG/ML  SOLN  COMPARISON:  06/06/2020  FINDINGS: Lower chest: No acute abnormality.  Hepatobiliary: Multiple gallstones are noted in a decompressed gallbladder. Liver is within normal limits.  Pancreas: Unremarkable. No pancreatic ductal dilatation or surrounding inflammatory changes.  Spleen: Normal in size without focal abnormality.  Adrenals/Urinary Tract: Adrenal glands are unremarkable. Kidneys demonstrate a normal enhancement pattern. No obstructive changes are seen. The bladder is partially distended.  Stomach/Bowel: The appendix is within normal limits. No obstructive or inflammatory changes of the colon are seen. Small bowel and stomach are within normal limits.  Vascular/Lymphatic: No significant vascular findings are present. No enlarged abdominal or pelvic lymph nodes.  Reproductive: Uterus  is within normal limits. Right ovary is unremarkable. Left ovary demonstrates a somewhat septated cystic lesion measuring 5.2 x 5.3 cm similar to that seen on prior exam.  Other: No abdominal wall hernia or abnormality. No abdominopelvic ascites.  Musculoskeletal: No acute or significant osseous findings.  IMPRESSION: Cholelithiasis in decompressed gallbladder. No inflammatory changes are noted.  Septated left ovarian cyst as described. This is similar to that seen on the exam from 2 days previous although decreased in size from a prior exam from 2017.  No other focal abnormality is noted.   Electronically Signed   By: Alcide Clever M.D.   On: 06/08/2020 20:50  CLINICAL DATA:  Right upper quadrant pain 3 days  EXAM: ULTRASOUND ABDOMEN LIMITED RIGHT UPPER QUADRANT  COMPARISON:  CT abdomen 06/08/2020  FINDINGS: Gallbladder:  Gallbladder is filled with calcified gallstones as noted on CT. There is dense shadowing. Negative sonographic Murphy sign. Gallstones measure up to 10 mm. Gallbladder wall thickened 8.8 mm.  Common bile duct:  Diameter: 4.2 mm  Liver:  Echogenic liver parenchyma compatible fatty infiltration without focal liver lesion. Portal vein is patent on color Doppler imaging with normal direction of blood flow towards the liver.  Other: None.  IMPRESSION: Cholelithiasis with gallbladder wall thickening. No biliary dilatation  Fatty liver.   Electronically Signed   By: Marlan Palau M.D.   On: 06/09/2020 10:27   Assessment & Plan:  Sara Ramirez is a 40 y.o. female with a PMH of HTN who presents for evaluation of cholelithiasis on CT. Her physical exam is notable for RUQ tenderness to palpation. Labs are notable for no leukocytosis, increased LFTs, or increased lipase.  -Continue IV NS, morphine and zofran prn -Can transition to soft diet until midnight tonight, then NPO -Will proceed with laparoscopic cholecystectomy  tomorrow morning, 3/16 due to symptomatic cholelithiasis -Begin IV ceftriaxone prophylaxis for surgery  All questions were answered to the satisfaction of the patient and family.  Avie Arenas Haseman 06/16/2020, 9:12 AM

## 2020-06-16 NOTE — ED Notes (Signed)
Pt ambulated to bathroom without difficulty and appears to be in NAD. Call bell within reach, bed in low position. Will continue to monitor.

## 2020-06-16 NOTE — TOC Progression Note (Signed)
Transition of Care [facility]) - Progression Note    Patient Details  Name: Sara Ramirez MRN: [id number] Date of Birth: 12-28-1980  Transition of [facility]) CM/SW Contact  Karn [place], [place] Phone Number: 06/16/2020, 8:13 AM  Clinical Narrative:  LCSW noted pt has no insurance and no PCP. LCSW discussed referrals to Care Connect for PCP and financial counselor to see if eligible for Medicaid. Pt agreeable and referrals made. TOC will continue to follow.            Expected Discharge Plan and Services                                                 Social Determinants of Health (SDOH) Interventions    Readmission Risk Interventions No flowsheet data found.

## 2020-06-16 NOTE — ED Notes (Signed)
Mother called out stating that the pt suddenly had a hot flash and felt nauseous. Pt provided with vomit bag. Pt not febrile at this time (Temp 99.1). Dr. Thomes Dinning at bedside speaking with patient at this time. Awaiting further orders.

## 2020-06-16 NOTE — Progress Notes (Signed)
Pt c/o nausea, Zofran 4mg  IV given. Pt also c/o hungry, so cereal provided. Pt states will attempt to eat once nausea subsides. No c/o pain at present. IVF infusing without s/s infiltration. Abx started, pt advised of s/s allergic rxn to watch for, states understanding. VSS. Husband at bedside. Both aware of pending surgery tomorrow. Soft diet today then NPO after mn.

## 2020-06-16 NOTE — Telephone Encounter (Signed)
Received referral for [facility] Connect program for Derenda Fennel LCSW at [facility].  Called Ms Coker to briefly discuss program and referral from LCSW No answer Left Voicemail.  Plan: will also attempt to reach client by hospital number   Will also send by mail information about the Care Connect program and a letter stating to connect with our program once discharged to discus eligibility and enrollment requirements.  Francee Nodal RN [employer]

## 2020-06-17 ENCOUNTER — Inpatient Hospital Stay (HOSPITAL_COMMUNITY): Payer: Medicaid Other | Admitting: Anesthesiology

## 2020-06-17 ENCOUNTER — Encounter (HOSPITAL_COMMUNITY)
Admission: EM | Disposition: A | Discharge status: Home / Self Care | Payer: Self-pay | Source: Home / Self Care | Attending: Internal Medicine

## 2020-06-17 ENCOUNTER — Encounter (HOSPITAL_COMMUNITY): Payer: Self-pay | Admitting: Internal Medicine

## 2020-06-17 DIAGNOSIS — K8000 Calculus of gallbladder with acute cholecystitis without obstruction: Secondary | ICD-10-CM

## 2020-06-17 DIAGNOSIS — R1011 Right upper quadrant pain: Secondary | ICD-10-CM

## 2020-06-17 HISTORY — PX: CHOLECYSTECTOMY: SHX55

## 2020-06-17 LAB — COMPREHENSIVE METABOLIC PANEL
ALT: 28 U/L (ref 0–44)
AST: 22 U/L (ref 15–41)
Albumin: 3.3 g/dL — ABNORMAL LOW (ref 3.5–5.0)
Alkaline Phosphatase: 83 U/L (ref 38–126)
Anion gap: 9 (ref 5–15)
BUN: 11 mg/dL (ref 6–20)
CO2: 23 mmol/L (ref 22–32)
Calcium: 8.6 mg/dL — ABNORMAL LOW (ref 8.9–10.3)
Chloride: 108 mmol/L (ref 98–111)
Creatinine, Ser: 0.72 mg/dL (ref 0.44–1.00)
GFR, Estimated: >60 mL/min (ref >60)
Glucose, Bld: 97 mg/dL (ref 70–99)
Potassium: 3.6 mmol/L (ref 3.5–5.1)
Sodium: 140 mmol/L (ref 135–145)
Total Bilirubin: 0.6 mg/dL (ref 0.3–1.2)
Total Protein: 6.6 g/dL (ref 6.5–8.1)

## 2020-06-17 LAB — MAGNESIUM: Magnesium: 1.9 mg/dL (ref 1.7–2.4)

## 2020-06-17 SURGERY — LAPAROSCOPIC CHOLECYSTECTOMY
Anesthesia: General | Site: Abdomen

## 2020-06-17 MED ORDER — DEXAMETHASONE SODIUM PHOSPHATE 10 MG/ML IJ SOLN
INTRAMUSCULAR | Status: DC | PRN
  Administered 2020-06-17: 8 mg via INTRAVENOUS

## 2020-06-17 MED ORDER — AMLODIPINE BESYLATE 5 MG PO TABS: 5.0000 mg | ORAL_TABLET | Freq: Every day | ORAL | 3 refills | Status: DC

## 2020-06-17 MED ORDER — PROPOFOL 10 MG/ML IV BOLUS
INTRAVENOUS | Status: AC
  Filled 2020-06-17: qty 20

## 2020-06-17 MED ORDER — ROCURONIUM BROMIDE 10 MG/ML (PF) SYRINGE
PREFILLED_SYRINGE | INTRAVENOUS | Status: AC
  Filled 2020-06-17: qty 10

## 2020-06-17 MED ORDER — MIDAZOLAM HCL 2 MG/2ML IJ SOLN
INTRAMUSCULAR | Status: AC
  Filled 2020-06-17: qty 2

## 2020-06-17 MED ORDER — SODIUM CHLORIDE 0.9 % IR SOLN
Status: DC | PRN
  Administered 2020-06-17: 1000 mL

## 2020-06-17 MED ORDER — HEMOSTATIC AGENTS (NO CHARGE) OPTIME
TOPICAL | Status: DC | PRN
  Administered 2020-06-17: 1 via TOPICAL

## 2020-06-17 MED ORDER — ONDANSETRON 4 MG PO TBDP: 4.0000 mg | ORAL_TABLET | Freq: Four times a day (QID) | ORAL | 0 refills | Status: DC | PRN

## 2020-06-17 MED ORDER — SCOPOLAMINE 1 MG/3DAYS TD PT72: 1.0000 | MEDICATED_PATCH | Freq: Once | TRANSDERMAL | Status: DC

## 2020-06-17 MED ORDER — DIPHENHYDRAMINE HCL 12.5 MG/5ML PO ELIX: 12.5000 mg | ORAL_SOLUTION | Freq: Four times a day (QID) | ORAL | Status: DC | PRN

## 2020-06-17 MED ORDER — LACTATED RINGERS IV SOLN: INTRAVENOUS | Status: DC

## 2020-06-17 MED ORDER — MIDAZOLAM HCL 5 MG/5ML IJ SOLN
INTRAMUSCULAR | Status: DC | PRN
  Administered 2020-06-17: 2 mg via INTRAVENOUS

## 2020-06-17 MED ORDER — MEPERIDINE HCL 50 MG/ML IJ SOLN: 6.2500 mg | INTRAMUSCULAR | Status: DC | PRN

## 2020-06-17 MED ORDER — HYDROMORPHONE HCL 1 MG/ML IJ SOLN
0.2500 mg | INTRAMUSCULAR | Status: DC | PRN
  Administered 2020-06-17 (×2): 0.5 mg via INTRAVENOUS
  Filled 2020-06-17 (×3): qty 0.5

## 2020-06-17 MED ORDER — BUPIVACAINE HCL (PF) 0.5 % IJ SOLN
INTRAMUSCULAR | Status: AC
  Filled 2020-06-17: qty 30

## 2020-06-17 MED ORDER — ROCURONIUM BROMIDE 10 MG/ML (PF) SYRINGE
PREFILLED_SYRINGE | INTRAVENOUS | Status: DC | PRN
  Administered 2020-06-17: 50 mg via INTRAVENOUS

## 2020-06-17 MED ORDER — PROMETHAZINE HCL 25 MG/ML IJ SOLN: 6.2500 mg | INTRAMUSCULAR | Status: DC | PRN

## 2020-06-17 MED ORDER — EPHEDRINE 5 MG/ML INJ
INTRAVENOUS | Status: AC
  Filled 2020-06-17: qty 10

## 2020-06-17 MED ORDER — FENTANYL CITRATE (PF) 250 MCG/5ML IJ SOLN
INTRAMUSCULAR | Status: AC
  Filled 2020-06-17: qty 5

## 2020-06-17 MED ORDER — ONDANSETRON HCL 4 MG/2ML IJ SOLN
INTRAMUSCULAR | Status: AC
  Filled 2020-06-17: qty 2

## 2020-06-17 MED ORDER — CEFOTETAN DISODIUM 2 G IJ SOLR
INTRAMUSCULAR | Status: AC
  Filled 2020-06-17: qty 2

## 2020-06-17 MED ORDER — ORAL CARE MOUTH RINSE: 15.0000 mL | Freq: Once | OROMUCOSAL | Status: AC

## 2020-06-17 MED ORDER — OXYCODONE HCL 5 MG PO TABS: 5.0000 mg | ORAL_TABLET | ORAL | 0 refills | Status: DC | PRN

## 2020-06-17 MED ORDER — ONDANSETRON HCL 4 MG/2ML IJ SOLN
4.0000 mg | Freq: Four times a day (QID) | INTRAMUSCULAR | Status: DC | PRN
  Administered 2020-06-17: 4 mg via INTRAVENOUS
  Filled 2020-06-17: qty 2

## 2020-06-17 MED ORDER — SUCCINYLCHOLINE CHLORIDE 200 MG/10ML IV SOSY
PREFILLED_SYRINGE | INTRAVENOUS | Status: DC | PRN
  Administered 2020-06-17: 100 mg via INTRAVENOUS

## 2020-06-17 MED ORDER — SUGAMMADEX SODIUM 200 MG/2ML IV SOLN
INTRAVENOUS | Status: DC | PRN
  Administered 2020-06-17: 200 mg via INTRAVENOUS

## 2020-06-17 MED ORDER — CHLORHEXIDINE GLUCONATE 0.12 % MT SOLN
15.0000 mL | Freq: Once | OROMUCOSAL | Status: AC
  Administered 2020-06-17: 15 mL via OROMUCOSAL
  Filled 2020-06-17: qty 15

## 2020-06-17 MED ORDER — BUPIVACAINE HCL (PF) 0.5 % IJ SOLN
INTRAMUSCULAR | Status: DC | PRN
  Administered 2020-06-17: 10 mL

## 2020-06-17 MED ORDER — DEXAMETHASONE SODIUM PHOSPHATE 10 MG/ML IJ SOLN
INTRAMUSCULAR | Status: AC
  Filled 2020-06-17: qty 1

## 2020-06-17 MED ORDER — FENTANYL CITRATE (PF) 100 MCG/2ML IJ SOLN
INTRAMUSCULAR | Status: DC | PRN
  Administered 2020-06-17 (×5): 50 ug via INTRAVENOUS
  Administered 2020-06-17: 100 ug via INTRAVENOUS
  Administered 2020-06-17: 50 ug via INTRAVENOUS

## 2020-06-17 MED ORDER — ONDANSETRON HCL 4 MG/2ML IJ SOLN
INTRAMUSCULAR | Status: DC | PRN
  Administered 2020-06-17: 4 mg via INTRAVENOUS

## 2020-06-17 MED ORDER — DIPHENHYDRAMINE HCL 50 MG/ML IJ SOLN: 12.5000 mg | Freq: Four times a day (QID) | INTRAMUSCULAR | Status: DC | PRN

## 2020-06-17 MED ORDER — SCOPOLAMINE 1 MG/3DAYS TD PT72
1.0000 | MEDICATED_PATCH | Freq: Once | TRANSDERMAL | Status: DC
  Administered 2020-06-17: 1.5 mg via TRANSDERMAL
  Filled 2020-06-17: qty 1

## 2020-06-17 MED ORDER — LIDOCAINE HCL (PF) 2 % IJ SOLN
INTRAMUSCULAR | Status: AC
  Filled 2020-06-17: qty 5

## 2020-06-17 MED ORDER — ONDANSETRON 4 MG PO TBDP: 4.0000 mg | ORAL_TABLET | Freq: Four times a day (QID) | ORAL | Status: DC | PRN

## 2020-06-17 MED ORDER — LIDOCAINE 2% (20 MG/ML) 5 ML SYRINGE
INTRAMUSCULAR | Status: DC | PRN
  Administered 2020-06-17: 100 mg via INTRAVENOUS

## 2020-06-17 MED ORDER — PROPOFOL 10 MG/ML IV BOLUS
INTRAVENOUS | Status: DC | PRN
  Administered 2020-06-17: 200 mg via INTRAVENOUS

## 2020-06-17 MED ORDER — OXYCODONE HCL 5 MG PO TABS
5.0000 mg | ORAL_TABLET | ORAL | Status: DC | PRN
  Administered 2020-06-17: 10 mg via ORAL
  Filled 2020-06-17: qty 2

## 2020-06-17 MED ORDER — MORPHINE SULFATE (PF) 2 MG/ML IV SOLN
2.0000 mg | INTRAVENOUS | Status: DC | PRN
  Administered 2020-06-17: 2 mg via INTRAVENOUS

## 2020-06-17 SURGICAL SUPPLY — 42 items
ADH SKN CLS APL DERMABOND .7 (GAUZE/BANDAGES/DRESSINGS) ×1
APL PRP STRL LF DISP 70% ISPRP (MISCELLANEOUS) ×1
APPLIER CLIP ROT 10 11.4 M/L (STAPLE) ×2
APR CLP MED LRG 11.4X10 (STAPLE) ×1
BAG RETRIEVAL 10 (BASKET) ×1
BLADE SURG 15 STRL LF DISP TIS (BLADE) ×1 IMPLANT
BLADE SURG 15 STRL SS (BLADE) ×2
CHLORAPREP W/TINT 26 (MISCELLANEOUS) ×2 IMPLANT
CLIP APPLIE ROT 10 11.4 M/L (STAPLE) ×1 IMPLANT
CLOTH BEACON ORANGE TIMEOUT ST (SAFETY) ×2 IMPLANT
COVER LIGHT HANDLE STERIS (MISCELLANEOUS) ×4 IMPLANT
COVER WAND RF STERILE (DRAPES) ×2 IMPLANT
DECANTER SPIKE VIAL GLASS SM (MISCELLANEOUS) ×2 IMPLANT
DERMABOND ADVANCED (GAUZE/BANDAGES/DRESSINGS) ×1
DERMABOND ADVANCED .7 DNX12 (GAUZE/BANDAGES/DRESSINGS) ×1 IMPLANT
ELECT REM PT RETURN 9FT ADLT (ELECTROSURGICAL) ×2
ELECTRODE REM PT RTRN 9FT ADLT (ELECTROSURGICAL) ×1 IMPLANT
GLOVE SURG ENC MOIS LTX SZ6.5 (GLOVE) ×2 IMPLANT
GLOVE SURG UNDER POLY LF SZ6.5 (GLOVE) ×2 IMPLANT
GLOVE SURG UNDER POLY LF SZ7 (GLOVE) ×6 IMPLANT
GOWN STRL REUS W/TWL LRG LVL3 (GOWN DISPOSABLE) ×6 IMPLANT
HEMOSTAT SNOW SURGICEL 2X4 (HEMOSTASIS) ×2 IMPLANT
INST SET LAPROSCOPIC AP (KITS) ×2 IMPLANT
KIT TURNOVER KIT A (KITS) ×2 IMPLANT
MANIFOLD NEPTUNE II (INSTRUMENTS) ×2 IMPLANT
NDL INSUFFLATION 14GA 120MM (NEEDLE) ×1 IMPLANT
NEEDLE INSUFFLATION 14GA 120MM (NEEDLE) ×2 IMPLANT
NS IRRIG 1000ML POUR BTL (IV SOLUTION) ×2 IMPLANT
PACK LAP CHOLE LZT030E (CUSTOM PROCEDURE TRAY) ×2 IMPLANT
PAD ARMBOARD 7.5X6 YLW CONV (MISCELLANEOUS) ×2 IMPLANT
SET BASIN LINEN APH (SET/KITS/TRAYS/PACK) ×2 IMPLANT
SET TUBE SMOKE EVAC HIGH FLOW (TUBING) ×2 IMPLANT
SLEEVE ENDOPATH XCEL 5M (ENDOMECHANICALS) ×2 IMPLANT
SUT MNCRL AB 4-0 PS2 18 (SUTURE) ×4 IMPLANT
SUT VICRYL 0 UR6 27IN ABS (SUTURE) ×2 IMPLANT
SYS BAG RETRIEVAL 10MM (BASKET) ×1
SYSTEM BAG RETRIEVAL 10MM (BASKET) ×1 IMPLANT
TROCAR ENDO BLADELESS 11MM (ENDOMECHANICALS) ×2 IMPLANT
TROCAR XCEL NON-BLD 5MMX100MML (ENDOMECHANICALS) ×2 IMPLANT
TROCAR XCEL UNIV SLVE 11M 100M (ENDOMECHANICALS) ×2 IMPLANT
TUBE CONNECTING 12X1/4 (SUCTIONS) ×2 IMPLANT
WARMER LAPAROSCOPE (MISCELLANEOUS) ×2 IMPLANT

## 2020-06-17 NOTE — Addendum Note (Signed)
Addendum  created 06/17/20 1331 by Julian Reil, CRNA   Flowsheet accepted, Intraprocedure Meds edited, LDA properties accepted

## 2020-06-17 NOTE — Progress Notes (Signed)
[facility]  RN to notify me if patient wants to go home later today. Rx sent to [facility].  Algis Greenhouse, MD [facility] [address] [place], [place] [address] [phone] (office)

## 2020-06-17 NOTE — Anesthesia Procedure Notes (Signed)
Procedure Name: Intubation Date/Time: 06/17/2020 8:43 AM Performed by: Julian Reil, CRNA Pre-anesthesia Checklist: Patient identified, Emergency Drugs available, Suction available and Patient being monitored Patient Re-evaluated:Patient Re-evaluated prior to induction Oxygen Delivery Method: Circle system utilized Preoxygenation: Pre-oxygenation with 100% oxygen Induction Type: IV induction Laryngoscope Size: Miller and 3 Grade View: Grade II Tube type: Oral Tube size: 7.0 mm Number of attempts: 1 Airway Equipment and Method: Stylet Placement Confirmation: positive ETCO2,  ETT inserted through vocal cords under direct vision and breath sounds checked- equal and bilateral Secured at: 22 cm Tube secured with: Tape Dental Injury: Teeth and Oropharynx as per pre-operative assessment  Comments: 4x4s bite block used.

## 2020-06-17 NOTE — Progress Notes (Signed)
[facility]  Updated husband. Surgery completed. Patient doing well. No issues. If doing ok can go home later today. Post op phone call in 2 weeks.  Algis Greenhouse, MD [facility] [address] [place], [place] [address] [phone] (office)

## 2020-06-17 NOTE — Anesthesia Postprocedure Evaluation (Signed)
Anesthesia Post Note  Patient: Sara Ramirez  Procedure(s) Performed: LAPAROSCOPIC CHOLECYSTECTOMY (N/A Abdomen)  Patient location during evaluation: PACU Anesthesia Type: General Level of consciousness: awake and alert and oriented Pain management: pain level controlled Vital Signs Assessment: post-procedure vital signs reviewed and stable Respiratory status: spontaneous breathing and respiratory function stable Cardiovascular status: blood pressure returned to baseline and stable Postop Assessment: no apparent nausea or vomiting Anesthetic complications: no   No complications documented.   Last Vitals:  Vitals:   06/17/20 1059 06/17/20 1146  BP: (!) 184/96 (!) 184/96  Pulse: 77   Resp: 16   Temp: 36.9 C   SpO2: 96%     Last Pain:  Vitals:   06/17/20 1200  TempSrc:   PainSc: 10-Worst pain ever                 Isami Mehra C Beryle Zeitz

## 2020-06-17 NOTE — Progress Notes (Signed)
[facility]  To whom it may concern,   [name] was in the hospital starting 06/15/2020 and had surgery on 06/17/2020.  She works from home but may not feel up to this in the next few days.  She can remain out of work if she desires until 06/22/2020 or perform some of her at home duties before then if feeling up to it.   Please call with questions or concerns.   Algis Greenhouse, MD [facility] [address] [place] [place], [place] [address] [phone] (office)

## 2020-06-17 NOTE — Discharge Summary (Signed)
Physician Discharge Summary  Sara Ramirez [facility]:[id number] DOB: [birth year] DOA: 06/15/2020  PCP: Patient, No Pcp Per  Admit date: 06/15/2020 Discharge date: 06/17/2020  Time spent: 35 minutes  Recommendations for Outpatient Follow-up:  1. Repeat basic metabolic panel to follow electrolytes and renal function 2. Reassess blood pressure and further adjust antihypertensive regimen as needed 3. Continue assisting patient with weight loss management and if needed referral to bariatric clinic.   Discharge Diagnoses:  Principal Problem:   Cholelithiasis Active Problems:   Essential hypertension   Uncontrolled hypertension   Morbid obesity (HCC)   Biliary colic   Abdominal pain   Nausea & vomiting   Symptomatic cholelithiasis   Discharge Condition: Stable and improved.  Discharged home with instruction to follow-up with PCP and to neurosurgery as an outpatient.  CODE STATUS: Full code  Diet recommendation: Heart healthy low calorie diet  Filed Weights   06/15/20 1958 06/16/20 0128  Weight: 125.6 kg 125.7 kg    History of present illness:  As per H&P written by Dr. Thomes Dinning on 06/15/2020 Sara Ramirez is a 40 y.o. female with medical history significant for hypertension who presents to the emergency department accompanied by mother due to more than 1 week onset of abdominal pain associated with nausea without vomiting.  She states that she went to [facility] at [facility], CT abdomen and pelvis was done and she was told that she had gallstones and discharged home with pain medication.  Patient states that she continues to have abdominal pain, so she presented to the ED on 06/08/2020 and CT abdomen and pelvis with contrast done at that time showed cholelithiasis, she was treated with IV morphine, Protonix, GI cocktail and IV Zofran and discharged home, she return to the ED the following day (3/8) and right upper quadrant ultrasound was done and it showed cholelithiasis with gallbladder wall  thickening and no biliary dilatation.  She returned this afternoon due to same abdominal pain in epigastric area and with radiation to the back, this was associated with nausea and vomiting, pain was rated as 10/10 on pain scale, there was no alleviating/aggravating factor.  ED Course:  In the emergency department, initial BP was 219/116, but this improved to 137/78 s/p abdominal pain medication.  Work-up in the ED showed normal CBC, hypokalemia.  SARS coronavirus 2 was negative, lipase 32. She was treated with IV fentanyl 50 mcg x 1 and IV Zofran 4 mg x 1.  General surgery (Dr. Henreitta Leber) was consulted and will see patient in the morning per ED physician.  Hospitalist was asked to admit patient for further evaluation and management.  Hospital Course:  1-chronic cholecystitis/symptomatic cholelithiasis -General surgery consultation appreciated -Patient treated with laparoscopic cholecystectomy and IV antibiotics -No need for antibiotics at discharge recommended -Continue as needed analgesics and antiemetics as prescribed by general surgery -Slowly continue advancing diet. -Outpatient follow-up with Dr. Henreitta Leber (general surgery) has been arranged.  2-essential hypertension -Elevated blood pressure appreciated on admission; patient was noncompliant with previously prescribed antihypertensive regimen. -Education about medication compliance provided -Patient discharged on amlodipine 5 mg daily -Instructed to follow heart healthy diet. -Reassessment of patient vital signs recommended in the next 2-4 weeks with further adjustment to antihypertensive regimen as needed.  3-hypokalemia -In the setting of GI losses and decreased oral intake -Electrolytes have been repleted and within normal limits at discharge -Magnesium 2.0.  4-morbid obesity -Body mass index is 49.09 kg/m. -Low calorie diet, portion control and increase physical activity has been discussed with  patient.  5-non-intractable  nausea/vomiting -In the setting of problem #1 -Improved after surgical intervention -Continue as needed antiemetics as mentioned above.  Procedures:  See below for x-ray reports  Laparoscopic cholecystectomy 06/17/2020.  Consultations:  General surgery  Discharge Exam: Vitals:   06/17/20 1059 06/17/20 1146  BP: (!) 184/96 (!) 184/96  Pulse: 77   Resp: 16   Temp: 98.4 F (36.9 C)   SpO2: 96%     General: Afebrile, no chest pain, no oxygen requirement appreciated.  Expected discomfort in her abdomen after surgical intervention.  No guarding and tolerating clear diet. Cardiovascular: S1 and S2, no rubs, no gallops, unable to properly assess JVD with body habitus. Respiratory: Good air movement bilaterally; no wheezing, no crackles.  Good saturation on room air. Abdomen: Obese, appropriately tender to palpation after cholecystectomy intervention.  No guarding, positive bowel sounds. Extremities: No cyanosis or clubbing.  Discharge Instructions   Discharge Instructions    Call MD for:  difficulty breathing, headache or visual disturbances   Complete by: As directed    Call MD for:  persistant dizziness or light-headedness   Complete by: As directed    Call MD for:  persistant nausea and vomiting   Complete by: As directed    Call MD for:  redness, tenderness, or signs of infection (pain, swelling, redness, odor or green/yellow discharge around incision site)   Complete by: As directed    Call MD for:  severe uncontrolled pain   Complete by: As directed    Call MD for:  temperature >100.4   Complete by: As directed    Increase activity slowly   Complete by: As directed      Allergies as of 06/17/2020      Reactions   Imitrex [sumatriptan] Nausea Only   Toradol [ketorolac Tromethamine] Nausea And Vomiting      Medication List    STOP taking these medications   HYDROcodone-acetaminophen 5-325 MG tablet Commonly known as: NORCO/VICODIN   ondansetron 4 MG  tablet Commonly known as: ZOFRAN     TAKE these medications   amLODipine 5 MG tablet Commonly known as: NORVASC Take 1 tablet (5 mg total) by mouth daily. What changed:   medication strength  how much to take   ondansetron 4 MG disintegrating tablet Commonly known as: ZOFRAN-ODT Take 1 tablet (4 mg total) by mouth every 6 (six) hours as needed for nausea.   oxyCODONE 5 MG immediate release tablet Commonly known as: Oxy IR/ROXICODONE Take 1 tablet (5 mg total) by mouth every 4 (four) hours as needed for severe pain or breakthrough pain.      Allergies  Allergen Reactions  . Imitrex [Sumatriptan] Nausea Only  . Toradol [Ketorolac Tromethamine] Nausea And Vomiting    Follow-up Information    [facility] Follow up.   Why: To establish PCP.  Contact information: [phone]       Lucretia Roers, MD On 07/02/2020.   Specialty: General Surgery Why: post op phone call; if you need to be seen in person call the office  Contact information: [address] [place] [facility] [id number] [phone]               The results of significant diagnostics from this hospitalization (including imaging, microbiology, ancillary and laboratory) are listed below for reference.    Significant Diagnostic Studies: CT ABDOMEN PELVIS W CONTRAST  Result Date: 06/08/2020 CLINICAL DATA:  Abdominal pain with nausea and vomiting for several days EXAM: CT ABDOMEN AND PELVIS WITH  CONTRAST TECHNIQUE: Multidetector CT imaging of the abdomen and pelvis was performed using the standard protocol following bolus administration of intravenous contrast. CONTRAST:  100mL OMNIPAQUE IOHEXOL 300 MG/ML  SOLN COMPARISON:  06/06/2020 FINDINGS: Lower chest: No acute abnormality. Hepatobiliary: Multiple gallstones are noted in a decompressed gallbladder. Liver is within normal limits. Pancreas: Unremarkable. No pancreatic ductal dilatation or surrounding inflammatory changes. Spleen: Normal in size without  focal abnormality. Adrenals/Urinary Tract: Adrenal glands are unremarkable. Kidneys demonstrate a normal enhancement pattern. No obstructive changes are seen. The bladder is partially distended. Stomach/Bowel: The appendix is within normal limits. No obstructive or inflammatory changes of the colon are seen. Small bowel and stomach are within normal limits. Vascular/Lymphatic: No significant vascular findings are present. No enlarged abdominal or pelvic lymph nodes. Reproductive: Uterus is within normal limits. Right ovary is unremarkable. Left ovary demonstrates a somewhat septated cystic lesion measuring 5.2 x 5.3 cm similar to that seen on prior exam. Other: No abdominal wall hernia or abnormality. No abdominopelvic ascites. Musculoskeletal: No acute or significant osseous findings. IMPRESSION: Cholelithiasis in decompressed gallbladder. No inflammatory changes are noted. Septated left ovarian cyst as described. This is similar to that seen on the exam from 2 days previous although decreased in size from a prior exam from 2017. No other focal abnormality is noted. Electronically Signed   By: Alcide CleverMark  Lukens M.D.   On: 06/08/2020 20:50   DG Chest Port 1 View  Result Date: 06/08/2020 CLINICAL DATA:  Epigastric and right-sided flank pain EXAM: PORTABLE CHEST 1 VIEW COMPARISON:  12/17/2018 FINDINGS: The heart size and mediastinal contours are within normal limits. Both lungs are clear. The visualized skeletal structures are unremarkable. IMPRESSION: No active disease. Electronically Signed   By: Sharlet SalinaMichael  Brown M.D.   On: 06/08/2020 17:46   US ABDOMEN LIMITED RUQ (LIVER/GB)  Result Date: 06/09/2020 CLINICAL DATA:  Right upper quadrant pain 3 days EXAM: ULTRASOUND ABDOMEN LIMITED RIGHT UPPER QUADRANT COMPARISON:  CT abdomen 06/08/2020 FINDINGS: Gallbladder: Gallbladder is filled with calcified gallstones as noted on CT. There is dense shadowing. Negative sonographic Murphy sign. Gallstones measure up to 10 mm.  Gallbladder wall thickened 8.8 mm. Common bile duct: Diameter: 4.2 mm Liver: Echogenic liver parenchyma compatible fatty infiltration without focal liver lesion. Portal vein is patent on color Doppler imaging with normal direction of blood flow towards the liver. Other: None. IMPRESSION: Cholelithiasis with gallbladder wall thickening. No biliary dilatation Fatty liver. Electronically Signed   By: Marlan Palauharles  Clark M.D.   On: 06/09/2020 10:27    Microbiology: Recent Results (from the past 240 hour(s))  Resp Panel by RT-PCR (Flu A&B, Covid) Nasopharyngeal Swab     Status: None   Collection Time: 06/15/20  8:47 PM   Specimen: Nasopharyngeal Swab; Nasopharyngeal(NP) swabs in vial transport medium  Result Value Ref Range Status   SARS Coronavirus 2 by RT PCR NEGATIVE NEGATIVE Final    Comment: (NOTE) SARS-CoV-2 target nucleic acids are NOT DETECTED.  The SARS-CoV-2 RNA is generally detectable in upper respiratory specimens during the acute phase of infection. The lowest concentration of SARS-CoV-2 viral copies this assay can detect is 138 copies/mL. A negative result does not preclude SARS-Cov-2 infection and should not be used as the sole basis for treatment or other patient management decisions. A negative result may occur with  improper specimen collection/handling, submission of specimen other than nasopharyngeal swab, presence of viral mutation(s) within the areas targeted by this assay, and inadequate number of viral copies(<138 copies/mL). A negative result must be  combined with clinical observations, patient history, and epidemiological information. The expected result is Negative.  Fact Sheet for Patients:  BloggerCourse.com  Fact Sheet for Healthcare Providers:  SeriousBroker.it  This test is no t yet approved or cleared by the [place] FDA and  has been authorized for detection and/or diagnosis of SARS-CoV-2 by FDA under an  Emergency Use Authorization (EUA). This EUA will remain  in effect (meaning this test can be used) for the duration of the COVID-19 declaration under Section 564(b)(1) of the Act, 21 U.S.C.section 360bbb-3(b)(1), unless the authorization is terminated  or revoked sooner.       Influenza A by PCR NEGATIVE NEGATIVE Final   Influenza B by PCR NEGATIVE NEGATIVE Final    Comment: (NOTE) The Xpert Xpress SARS-CoV-2/FLU/RSV plus assay is intended as an aid in the diagnosis of influenza from Nasopharyngeal swab specimens and should not be used as a sole basis for treatment. Nasal washings and aspirates are unacceptable for Xpert Xpress SARS-CoV-2/FLU/RSV testing.  Fact Sheet for Patients: BloggerCourse.com  Fact Sheet for Healthcare Providers: SeriousBroker.it  This test is not yet approved or cleared by the [place] FDA and has been authorized for detection and/or diagnosis of SARS-CoV-2 by FDA under an Emergency Use Authorization (EUA). This EUA will remain in effect (meaning this test can be used) for the duration of the COVID-19 declaration under Section 564(b)(1) of the Act, 21 U.S.C. section 360bbb-3(b)(1), unless the authorization is terminated or revoked.  Performed at [facility], [address], [place], [place] [address]      Labs: Basic Metabolic Panel: Recent Labs  Lab 06/15/20 2041 06/16/20 0641 06/17/20 0601  NA 136 139 140  K 3.2* 3.4* 3.6  CL 100 106 108  CO2 25 24 23   GLUCOSE 109* 100* 97  BUN 13 11 11   CREATININE 0.91 0.76 0.72  CALCIUM 9.0 8.4* 8.6*  MG  --  2.0 1.9  PHOS  --  2.9  --    Liver Function Tests: Recent Labs  Lab 06/15/20 2041 06/16/20 0641 06/17/20 0601  AST 23 22 22   ALT 29 26 28   ALKPHOS 103 81 83  BILITOT 0.7 0.5 0.6  PROT 8.1 6.5 6.6  ALBUMIN 4.2 3.3* 3.3*   Recent Labs  Lab 06/15/20 2041  LIPASE 32   CBC: Recent Labs  Lab 06/15/20 2041 06/16/20 0641  WBC 9.6  7.8  NEUTROABS 6.7  --   HGB 13.4 11.8*  HCT 40.4 35.9*  MCV 92.0 92.5  PLT 358 324    Signed:  2042 MD.  [facility] 06/17/2020, 4:23 PM

## 2020-06-17 NOTE — Op Note (Signed)
Operative Note   Preoperative Diagnosis: Acute cholecystitis    Postoperative Diagnosis: Same   Procedure(s) Performed: Laparoscopic cholecystectomy   Surgeon: Lillia Abed C. Henreitta Leber, MD   Assistants: No qualified resident was available   Anesthesia: General endotracheal   Anesthesiologist: Molli Barrows, MD    Specimens: Gallbladder    Estimated Blood Loss: Minimal    Blood Replacement: None    Complications: None    Operative Findings: Gallbladder with stones and edema    Procedure: The patient was taken to the operating room and placed supine. General endotracheal anesthesia was induced. Intravenous antibiotics were administered per protocol. An orogastric tube positioned to decompress the stomach. The abdomen was prepared and draped in the usual sterile fashion.    A supraumbilical  incision was made and a Veress technique was utilized to achieve pneumoperitoneum to 15 mmHg with carbon dioxide. A 11 mm optiview port was placed through the supraumbilical region, and a 10 mm 0-degree operative laparoscope was introduced. The area underlying the trocar and Veress needle were inspected and without evidence of injury.  Remaining trocars were placed under direct vision. Two 5 mm ports were placed in the right abdomen, between the anterior axillary and midclavicular line.  A final 11 mm port was placed through the mid-epigastrium, near the falciform ligament.    The gallbladder fundus was elevated cephalad and the infundibulum was retracted to the patient's right. The gallbladder/cystic duct junction was skeletonized. The cystic artery noted in the triangle of Calot and was also skeletonized.  We then continued liberal medial and lateral dissection until the critical view of safety was achieved.    The cystic duct was clipped four times as the middle clip did not cross the duct fully, and cystic artery was doubly clipped and both were divided. The gallbladder was then dissected from  the liver bed with electrocautery. There was spillage of stones but these were retrieved. The specimen was placed in an Endopouch and was retrieved through the epigastric site.   Final inspection revealed acceptable hemostasis. Surgical SNOW was placed in the gallbladder bed.  Trocars were removed and pneumoperitoneum was released.  0 Vicryl fascial sutures were used to close the epigastric and umbilical port sites. Skin incisions were closed with 4-0 Monocryl subcuticular sutures and Dermabond. The patient was awakened from anesthesia and extubated without complication.    Algis Greenhouse, MD [facility] [address] [place], [place] [address] [phone] (office)

## 2020-06-17 NOTE — Anesthesia Preprocedure Evaluation (Signed)
Anesthesia Evaluation  Patient identified by MRN, date of birth, ID band Patient awake    Reviewed: Allergy & Precautions, NPO status , Patient's Chart, lab work & pertinent test results  Airway Mallampati: II  TM Distance: >3 FB Neck ROM: Full    Dental  (+) Dental Advisory Given, Teeth Intact   Pulmonary    Pulmonary exam normal breath sounds clear to auscultation       Cardiovascular Exercise Tolerance: Good hypertension, Pt. on medications Normal cardiovascular exam+ Valvular Problems/Murmurs  Rhythm:Regular Rate:Normal - Systolic murmurs, - Diastolic murmurs, - Friction Rub, - Carotid Bruit, - Peripheral Edema and - Systolic Click    Neuro/Psych  Headaches, Anxiety    GI/Hepatic negative GI ROS, Cholelithiasis    Endo/Other  Morbid obesity  Renal/GU Renal disease     Musculoskeletal negative musculoskeletal ROS (+)   Abdominal   Peds  Hematology negative hematology ROS (+)   Anesthesia Other Findings   Reproductive/Obstetrics negative OB ROS                            Anesthesia Physical Anesthesia Plan  ASA: III  Anesthesia Plan: General   Post-op Pain Management:    Induction: Intravenous  PONV Risk Score and Plan: Ondansetron, Dexamethasone, Midazolam and Scopolamine patch - Pre-op  Airway Management Planned: Oral ETT  Additional Equipment:   Intra-op Plan:   Post-operative Plan: Extubation in OR  Informed Consent: I have reviewed the patients History and Physical, chart, labs and discussed the procedure including the risks, benefits and alternatives for the proposed anesthesia with the patient or authorized representative who has indicated his/her understanding and acceptance.     Dental advisory given  Plan Discussed with: CRNA and Surgeon  Anesthesia Plan Comments:        Anesthesia Quick Evaluation

## 2020-06-17 NOTE — Plan of Care (Signed)

## 2020-06-17 NOTE — Plan of Care (Signed)
Alert and oriented x 4. C/o abdominal pain. Morphine given with relief. IV fluids infused and tolerated well. NPO after midnight for procedure. CHG wipes used at bedtime and in the morning. Pt notified family procedure time. Will continue plan of care.  Problem: Education: Goal: Knowledge of General Education information will improve Description: Including pain rating scale, medication(s)/side effects and non-pharmacologic comfort measures Outcome: Progressing   Problem: Health Behavior/Discharge Planning: Goal: Ability to manage health-related needs will improve Outcome: Progressing   Problem: Clinical Measurements: Goal: Ability to maintain clinical measurements within normal limits will improve Outcome: Progressing Goal: Will remain free from infection Outcome: Progressing Goal: Diagnostic test results will improve Outcome: Progressing Goal: Respiratory complications will improve Outcome: Progressing Goal: Cardiovascular complication will be avoided Outcome: Progressing   Problem: Activity: Goal: Risk for activity intolerance will decrease Outcome: Progressing   Problem: Nutrition: Goal: Adequate nutrition will be maintained Outcome: Progressing   Problem: Coping: Goal: Level of anxiety will decrease Outcome: Progressing   Problem: Elimination: Goal: Will not experience complications related to bowel motility Outcome: Progressing Goal: Will not experience complications related to urinary retention Outcome: Progressing   Problem: Pain Managment: Goal: General experience of comfort will improve Outcome: Progressing   Problem: Safety: Goal: Ability to remain free from injury will improve Outcome: Progressing   Problem: Skin Integrity: Goal: Risk for impaired skin integrity will decrease Outcome: Progressing

## 2020-06-17 NOTE — Transfer of Care (Signed)
Immediate Anesthesia Transfer of Care Note  Patient: Rylie N Densmore  Procedure(s) Performed: LAPAROSCOPIC CHOLECYSTECTOMY (N/A Abdomen)  Patient Location: PACU  Anesthesia Type:General  Level of Consciousness: awake, alert  and oriented  Airway & Oxygen Therapy: Patient Spontanous Breathing and Patient connected to nasal cannula oxygen  Post-op Assessment: Report given to RN, Post -op Vital signs reviewed and stable and Patient moving all extremities X 4  Post vital signs: Reviewed and stable  Last Vitals:  Vitals Value Taken Time  BP 187/108 06/17/20 0954  Temp    Pulse 87 06/17/20 0955  Resp 16 06/17/20 0955  SpO2 98 % 06/17/20 0955  Vitals shown include unvalidated device data.  Last Pain:  Vitals:   06/17/20 0720  TempSrc: Oral  PainSc: 0-No pain      Patients Stated Pain Goal: 3 (06/16/20 0805)  Complications: No complications documented.

## 2020-06-17 NOTE — Interval H&P Note (Signed)
History and Physical Interval Note:  06/17/2020 8:26 AM  Sara Ramirez  has presented today for surgery, with the diagnosis of acute cholecystitis.  The various methods of treatment have been discussed with the patient and family. After consideration of risks, benefits and other options for treatment, the patient has consented to  Procedure(s): LAPAROSCOPIC CHOLECYSTECTOMY (N/A) as a surgical intervention.  The patient's history has been reviewed, patient examined, no change in status, stable for surgery.  I have reviewed the patient's chart and labs.  Questions were answered to the patient's satisfaction.    No changes.  Lucretia Roers

## 2020-06-18 ENCOUNTER — Ambulatory Visit: Payer: Self-pay | Admitting: General Surgery

## 2020-06-18 ENCOUNTER — Encounter (HOSPITAL_COMMUNITY): Payer: Self-pay | Admitting: General Surgery

## 2020-06-18 LAB — SURGICAL PATHOLOGY

## 2020-06-25 ENCOUNTER — Ambulatory Visit: Payer: Self-pay | Admitting: General Surgery

## 2020-07-02 ENCOUNTER — Ambulatory Visit: Payer: Self-pay | Admitting: General Surgery

## 2021-07-21 ENCOUNTER — Ambulatory Visit: Payer: Self-pay | Admitting: Registered Nurse

## 2021-08-03 ENCOUNTER — Encounter: Payer: Self-pay | Admitting: Registered Nurse

## 2021-08-03 ENCOUNTER — Other Ambulatory Visit: Payer: Self-pay

## 2021-08-03 ENCOUNTER — Ambulatory Visit (INDEPENDENT_AMBULATORY_CARE_PROVIDER_SITE_OTHER): Admitting: Registered Nurse

## 2021-08-03 VITALS — BP 144/90 | HR 80 | Temp 98.0°F | Resp 17 | Ht 63.0 in | Wt 254.0 lb

## 2021-08-03 DIAGNOSIS — F331 Major depressive disorder, recurrent, moderate: Secondary | ICD-10-CM | POA: Insufficient documentation

## 2021-08-03 DIAGNOSIS — F411 Generalized anxiety disorder: Secondary | ICD-10-CM | POA: Diagnosis not present

## 2021-08-03 DIAGNOSIS — G43709 Chronic migraine without aura, not intractable, without status migrainosus: Secondary | ICD-10-CM | POA: Diagnosis not present

## 2021-08-03 DIAGNOSIS — I10 Essential (primary) hypertension: Secondary | ICD-10-CM | POA: Diagnosis not present

## 2021-08-03 LAB — URINALYSIS, ROUTINE W REFLEX MICROSCOPIC
Bilirubin Urine: NEGATIVE
Ketones, ur: NEGATIVE
Nitrite: NEGATIVE
Specific Gravity, Urine: 1.02 (ref 1.000–1.030)
Urine Glucose: NEGATIVE
Urobilinogen, UA: 0.2 (ref 0.0–1.0)
pH: 7 (ref 5.0–8.0)

## 2021-08-03 LAB — CBC WITH DIFFERENTIAL/PLATELET
Basophils Absolute: 0.1 10*3/uL (ref 0.0–0.1)
Basophils Relative: 0.9 % (ref 0.0–3.0)
Eosinophils Absolute: 0.1 10*3/uL (ref 0.0–0.7)
Eosinophils Relative: 1.3 % (ref 0.0–5.0)
HCT: 39.6 % (ref 36.0–46.0)
Hemoglobin: 13.1 g/dL (ref 12.0–15.0)
Lymphocytes Relative: 27.4 % (ref 12.0–46.0)
Lymphs Abs: 2 10*3/uL (ref 0.7–4.0)
MCHC: 33 g/dL (ref 30.0–36.0)
MCV: 91.4 fl (ref 78.0–100.0)
Monocytes Absolute: 0.4 10*3/uL (ref 0.1–1.0)
Monocytes Relative: 5.3 % (ref 3.0–12.0)
Neutro Abs: 4.9 10*3/uL (ref 1.4–7.7)
Neutrophils Relative %: 65.1 % (ref 43.0–77.0)
Platelets: 359 10*3/uL (ref 150.0–400.0)
RBC: 4.34 Mil/uL (ref 3.87–5.11)
RDW: 14.8 % (ref 11.5–15.5)
WBC: 7.5 10*3/uL (ref 4.0–10.5)

## 2021-08-03 LAB — COMPREHENSIVE METABOLIC PANEL
ALT: 21 U/L (ref 0–35)
AST: 17 U/L (ref 0–37)
Albumin: 4.3 g/dL (ref 3.5–5.2)
Alkaline Phosphatase: 109 U/L (ref 39–117)
BUN: 15 mg/dL (ref 6–23)
CO2: 26 mEq/L (ref 19–32)
Calcium: 9 mg/dL (ref 8.4–10.5)
Chloride: 102 mEq/L (ref 96–112)
Creatinine, Ser: 1.07 mg/dL (ref 0.40–1.20)
GFR: 65.91 mL/min (ref >60.00)
Glucose, Bld: 90 mg/dL (ref 70–99)
Potassium: 4 mEq/L (ref 3.5–5.1)
Sodium: 137 mEq/L (ref 135–145)
Total Bilirubin: 0.7 mg/dL (ref 0.2–1.2)
Total Protein: 7.4 g/dL (ref 6.0–8.3)

## 2021-08-03 LAB — LIPID PANEL
Cholesterol: 182 mg/dL (ref 0–200)
HDL: 50.9 mg/dL (ref >39.00)
LDL Cholesterol: 113 mg/dL — ABNORMAL HIGH (ref 0–99)
NonHDL: 130.97
Total CHOL/HDL Ratio: 4
Triglycerides: 91 mg/dL (ref 0.0–149.0)
VLDL: 18.2 mg/dL (ref 0.0–40.0)

## 2021-08-03 LAB — HEMOGLOBIN A1C: Hgb A1c MFr Bld: 4.9 % (ref 4.6–6.5)

## 2021-08-03 LAB — TSH: TSH: 1.86 u[IU]/mL (ref 0.35–5.50)

## 2021-08-03 MED ORDER — TIRZEPATIDE 10 MG/0.5ML ~~LOC~~ SOAJ: 10.0000 mg | SUBCUTANEOUS | 0 refills | Status: DC

## 2021-08-03 MED ORDER — AMLODIPINE BESYLATE 10 MG PO TABS
10.0000 mg | ORAL_TABLET | Freq: Every day | ORAL | 1 refills | Status: DC
Start: 2021-08-03 — End: 2022-01-26

## 2021-08-03 MED ORDER — ETODOLAC 200 MG PO CAPS: 200.0000 mg | ORAL_CAPSULE | Freq: Three times a day (TID) | ORAL | 3 refills | Status: DC | PRN

## 2021-08-03 MED ORDER — FLUOXETINE HCL 20 MG PO CAPS: 20.0000 mg | ORAL_CAPSULE | Freq: Every day | ORAL | 0 refills | Status: DC

## 2021-08-03 NOTE — Assessment & Plan Note (Signed)
Has had success with NSAIDs in the past despite poor reaction to oral toradol. Willing to try etodolac. Continue to consider different triptan or consider neuro referral to headache specialist. ?

## 2021-08-03 NOTE — Assessment & Plan Note (Signed)
Start fluoxetine 20mg  po qd. Med chcek in 6 weeks. Reviewed risks, benefits, and side effects, pt voices understanding. ? ? ?

## 2021-08-03 NOTE — Assessment & Plan Note (Signed)
Resume mounjaro. Monitor effect. Reviewed risks, benefits, and side effects, pt voices understanding. ? ?

## 2021-08-03 NOTE — Progress Notes (Signed)
? ?New Patient Office Visit ? ?Subjective:  ?Patient ID: Sara Ramirez, female    DOB: 04-30-1980  Age: 41 y.o. MRN: [id number] ? ?CC:  ?Chief Complaint  ?Patient presents with  ? New Patient (Initial Visit)  ?  Patient states she is here to establish care. Patient states she would like to discuss BP medication and Mounjaro.  ? ? ?HPI ?Sara Ramirez presents to establish care ? ?Histories reviewed and updated with patient.  ? ?Hypertension: ?Patient Currently taking: amlodipine 5mg  po qd ?Good effect. No AEs. ?Denies CV symptoms including: chest pain, shob, doe, headache, visual changes, fatigue, claudication, and dependent edema.  ? ?Previous readings and labs: ?BP Readings from Last 3 Encounters:  ?08/03/21 (!) 144/90  ?06/17/20 (!) 184/96  ?06/08/20 (!) 174/85  ? ?Lab Results  ?Component Value Date  ? CREATININE 1.07 08/03/2021  ? ?Obese ?Has been on mounjaro 10mg  subq  ?Good effect, no AE ?Has run low, wants to resume. ? ?Depression and Anxiety ?Witnessed to deaths and trauma. Step father has stage iv liver ca. Husband is disabled Psychologist, clinical. ?Sister takes sertraline, mother takes lorazepam ?Had not done counseling in the past. ?No hi/si ? ?Migraines ?Maxalt and imitrex for migraines in the past ?Had taken indomethacin in the past with good effect ?Notes triptans make her nauseous. ? ?Outpatient Encounter Medications as of 08/03/2021  ?Medication Sig  ? amLODipine (NORVASC) 10 MG tablet Take 1 tablet (10 mg total) by mouth daily.  ? etodolac (LODINE) 200 MG capsule Take 1 capsule (200 mg total) by mouth 3 (three) times daily as needed (headache).  ? FLUoxetine (PROZAC) 20 MG capsule Take 1 capsule (20 mg total) by mouth daily.  ? tirzepatide (MOUNJARO) 10 MG/0.5ML Pen Inject 10 mg into the skin once a week.  ? [DISCONTINUED] ondansetron (ZOFRAN-ODT) 4 MG disintegrating tablet Take 1 tablet (4 mg total) by mouth every 6 (six) hours as needed for nausea.  ? [DISCONTINUED] amLODipine (NORVASC) 5 MG tablet Take 1  tablet (5 mg total) by mouth daily.  ? [DISCONTINUED] oxyCODONE (OXY IR/ROXICODONE) 5 MG immediate release tablet Take 1 tablet (5 mg total) by mouth every 4 (four) hours as needed for severe pain or breakthrough pain. (Patient not taking: Reported on 08/03/2021)  ? ?No facility-administered encounter medications on file as of 08/03/2021.  ? ? ?Past Medical History:  ?Diagnosis Date  ? Anxiety   ? Hypertension   ? Kidney stone   ? Migraines   ? Mitral regurgitation   ? Renal disorder   ? kidney stones  ? Tricuspid regurgitation   ? ? ?Past Surgical History:  ?Procedure Laterality Date  ? CHOLECYSTECTOMY N/A 06/17/2020  ? Procedure: LAPAROSCOPIC CHOLECYSTECTOMY;  Surgeon: Virl Cagey, MD;  Location: [facility];  Service: General;  Laterality: N/A;  ? CYSTOSCOPY W/ URETERAL STENT PLACEMENT Right 01/03/2013  ? Procedure: CYSTOSCOPY WITH RIGHT RETROGRADE PYELOGRAM/ RIGHT URETERAL STENT PLACEMENT;  Surgeon: Reece Packer, MD;  Location: [facility];  Service: Urology;  Laterality: Right;  ? LITHOTRIPSY    ? ? ?Family History  ?Problem Relation Age of Onset  ? Diabetes Father   ? Migraines Mother   ? Heart disease Paternal Grandmother   ? Stroke Paternal Grandmother   ? Migraines Son   ? Diabetes Maternal Grandfather   ? ? ?Social History  ? ?Socioeconomic History  ? Marital status: Married  ?  Spouse name: Not on file  ? Number of children: 1  ? Years of education: Not on  file  ? Highest education level: Not on file  ?Occupational History  ? Occupation: medical billing and coding  ?Tobacco Use  ? Smoking status: Never  ? Smokeless tobacco: Never  ?Vaping Use  ? Vaping Use: Never used  ?Substance and Sexual Activity  ? Alcohol use: No  ?  Alcohol/week: 0.0 standard drinks  ? Drug use: No  ? Sexual activity: Yes  ?  Birth control/protection: None  ?Other Topics Concern  ? Not on file  ?Social History Narrative  ? ** Merged History Encounter **  ?    ? Divorced. Has one child Sara Ramirez. Lives with boyfriend and his additional 3  children.  ?Associates degree. Insurance billing.  ?Drinks caffeine.  ?Wears her seatbelt, smoke detector in the home. ?Feels safe in her relationships.  ? ?Social Determinants of Health  ? ?Financial Resource Strain: Not on file  ?Food Insecurity: Not on file  ?Transportation Needs: Not on file  ?Physical Activity: Not on file  ?Stress: Not on file  ?Social Connections: Not on file  ?Intimate Partner Violence: Not on file  ? ? ?ROS ?Review of Systems  ?Constitutional: Negative.   ?HENT: Negative.    ?Eyes: Negative.   ?Respiratory: Negative.    ?Cardiovascular: Negative.   ?Gastrointestinal: Negative.   ?Genitourinary: Negative.   ?Musculoskeletal: Negative.   ?Skin: Negative.   ?Neurological: Negative.   ?Psychiatric/Behavioral: Negative.    ?All other systems reviewed and are negative. ? ?Objective:  ? ?Today's Vitals: BP (!) 144/90   Pulse 80   Temp 98 ?F (36.7 ?C) (Temporal)   Resp 17   Ht 5\' 3"  (1.6 m)   Wt 254 lb (115.2 kg)   SpO2 99%   BMI 44.99 kg/m?  ? ?Physical Exam ?Constitutional:   ?   General: She is not in acute distress. ?   Appearance: Normal appearance. She is normal weight. She is not ill-appearing, toxic-appearing or diaphoretic.  ?HENT:  ?   Head: Normocephalic and atraumatic.  ?Skin: ?   Coloration: Skin is not jaundiced or pale.  ?   Findings: No bruising, erythema, lesion or rash.  ?Neurological:  ?   Mental Status: She is alert.  ?Psychiatric:     ?   Mood and Affect: Mood normal.     ?   Behavior: Behavior normal.     ?   Thought Content: Thought content normal.     ?   Judgment: Judgment normal.  ? ? ? ? ? ? ?Assessment & Plan:  ? ?Problem List Items Addressed This Visit   ? ?  ? Cardiovascular and Mediastinum  ? Migraine headache  ?  Has had success with NSAIDs in the past despite poor reaction to oral toradol. Willing to try etodolac. Continue to consider different triptan or consider neuro referral to headache specialist. ? ?  ?  ? Relevant Medications  ? amLODipine (NORVASC) 10  MG tablet  ? etodolac (LODINE) 200 MG capsule  ? FLUoxetine (PROZAC) 20 MG capsule  ? Primary hypertension - Primary  ?  Uncontrolled. Increase Amlodipine to 10mg  po qd . bp check nurse visit in 2-3 weeks. ? ?  ?  ? Relevant Medications  ? amLODipine (NORVASC) 10 MG tablet  ? Other Relevant Orders  ? CBC with Differential/Platelet (Completed)  ? Comprehensive metabolic panel (Completed)  ? Lipid panel (Completed)  ? TSH (Completed)  ? Urinalysis, Routine w reflex microscopic (Completed)  ?  ? Other  ? Obesity, Class III, BMI 40-49.9 (morbid  obesity) ([place])  ?  Resume mounjaro. Monitor effect. Reviewed risks, benefits, and side effects, pt voices understanding. ? ? ?  ?  ? Relevant Medications  ? tirzepatide (MOUNJARO) 10 MG/0.5ML Pen  ? Other Relevant Orders  ? Hemoglobin A1c (Completed)  ? Lipid panel (Completed)  ? TSH (Completed)  ? Urinalysis, Routine w reflex microscopic (Completed)  ? GAD (generalized anxiety disorder)  ?  Start fluoxetine 20mg  po qd. Med chcek in 6 weeks. Reviewed risks, benefits, and side effects, pt voices understanding. ? ? ?  ?  ? Relevant Medications  ? FLUoxetine (PROZAC) 20 MG capsule  ? Other Relevant Orders  ? Ambulatory referral to Psychology  ? Moderate episode of recurrent major depressive disorder ([place])  ?  Start fluoxetine 20mg  po qd. Med chcek in 6 weeks. Reviewed risks, benefits, and side effects, pt voices understanding. ? ? ? ?  ?  ? Relevant Medications  ? FLUoxetine (PROZAC) 20 MG capsule  ? Other Relevant Orders  ? Ambulatory referral to Psychology  ? ? ?Follow-up: Return in about 6 weeks (around 09/14/2021) for med check.  ? ?Maximiano Coss, NP ?

## 2021-08-03 NOTE — Patient Instructions (Addendum)
Ms. Laubscher - ? ?Great to meet you! ? ?In brief: ?Amlodipine increase to 10mg  daily. Nurse Only visit in 2-3 weeks to recheck bp ?Start fluoxetine daily in mornings for anxiety/depression. I have referred to counseling - they'll call you. Touch base in 6 weeks to see how things are working. ?Restart mounjaro at 10mg  weekly. ?Start etodolac three times daily as needed for migraines. ? ?Labs will be back today or tomorrow - I'll call with any worries! ? ?For acute mental health needs - Behavioral Health at [address] is available ? ?Thank you, talk soon, ? ?Rich  ? ? ? ?If you have lab work done today you will be contacted with your lab results within the next 2 weeks.  If you have not heard from then please contact [address] The fastest way to get your results is to register for My Chart. ? ? ?IF you received an x-ray today, you will receive an invoice from [facility]. Please contact [facility] at [phone] with questions or concerns regarding your invoice.  ? ?IF you received labwork today, you will receive an invoice from [facility]. Please contact [facility] at [phone] with questions or concerns regarding your invoice.  ? ?Our billing staff will not be able to assist you with questions regarding bills from these companies. ? ?You will be contacted with the lab results as soon as they are available. The fastest way to get your results is to activate your My Chart account. Instructions are located on the last page of this paperwork. If you have not heard from [place] regarding the results in 2 weeks, please contact this office. ?  ? ? ?

## 2021-08-03 NOTE — Assessment & Plan Note (Signed)
Uncontrolled. Increase Amlodipine to 10mg  po qd . bp check nurse visit in 2-3 weeks. ?

## 2021-08-03 NOTE — Assessment & Plan Note (Signed)
Start fluoxetine 20mg po qd. Med chcek in 6 weeks. Reviewed risks, benefits, and side effects, pt voices understanding. ? ? ?

## 2021-08-24 ENCOUNTER — Ambulatory Visit: Admitting: Registered Nurse

## 2021-08-26 ENCOUNTER — Ambulatory Visit: Admitting: Registered Nurse

## 2021-09-09 ENCOUNTER — Encounter: Payer: Self-pay | Admitting: Registered Nurse

## 2021-09-09 ENCOUNTER — Other Ambulatory Visit: Payer: Self-pay

## 2021-09-09 ENCOUNTER — Ambulatory Visit (INDEPENDENT_AMBULATORY_CARE_PROVIDER_SITE_OTHER): Admitting: Registered Nurse

## 2021-09-09 VITALS — BP 140/88 | HR 75 | Temp 98.3°F | Resp 18 | Ht 63.0 in | Wt 242.0 lb

## 2021-09-09 DIAGNOSIS — G479 Sleep disorder, unspecified: Secondary | ICD-10-CM | POA: Insufficient documentation

## 2021-09-09 DIAGNOSIS — F411 Generalized anxiety disorder: Secondary | ICD-10-CM

## 2021-09-09 DIAGNOSIS — R110 Nausea: Secondary | ICD-10-CM | POA: Diagnosis not present

## 2021-09-09 DIAGNOSIS — F331 Major depressive disorder, recurrent, moderate: Secondary | ICD-10-CM

## 2021-09-09 MED ORDER — FLUOXETINE HCL 20 MG PO CAPS
20.0000 mg | ORAL_CAPSULE | Freq: Every day | ORAL | 3 refills | Status: DC
Start: 2021-09-09 — End: 2022-11-18

## 2021-09-09 MED ORDER — ONDANSETRON 4 MG PO TBDP: 4.0000 mg | ORAL_TABLET | Freq: Three times a day (TID) | ORAL | 0 refills | Status: DC | PRN

## 2021-09-09 MED ORDER — TRAZODONE HCL 50 MG PO TABS
25.0000 mg | ORAL_TABLET | Freq: Every evening | ORAL | 3 refills | Status: DC | PRN
Start: 2021-09-09 — End: 2022-04-19

## 2021-09-09 NOTE — Patient Instructions (Addendum)
Ms. Jaslin Zaro to see you!  Glad it's going well  Trazodone 1/2 tab before bed as needed  See you to check in in around 3 mo  Thanks,  Rich     If you have lab work done today you will be contacted with your lab results within the next 2 weeks.  If you have not heard from [place] then please contact us. The fastest way to get your results is to register for My Chart.   IF you received an x-ray today, you will receive an invoice from [facility]. Please contact [facility] at [phone] with questions or concerns regarding your invoice.   IF you received labwork today, you will receive an invoice from [facility]. Please contact [facility] at [phone] with questions or concerns regarding your invoice.   Our billing staff will not be able to assist you with questions regarding bills from these companies.  You will be contacted with the lab results as soon as they are available. The fastest way to get your results is to activate your My Chart account. Instructions are located on the last page of this paperwork. If you have not heard from [place] regarding the results in 2 weeks, please contact this office.

## 2021-09-09 NOTE — Assessment & Plan Note (Signed)
Trazodone 25-50mg  po qhs prn Reviewed risks, benefits, and side effects, pt voices understanding.

## 2021-09-09 NOTE — Progress Notes (Addendum)
Established Patient Office Visit  Subjective:  Patient ID: Sara Ramirez, female    DOB: [birth year]  Age: 41 y.o. MRN: [id number]  CC:  Chief Complaint  Patient presents with   Follow-up    Patient states she is here for medication check and discuss sleeping    HPI Keyanah Over Wool presents for med check  GAD/MDD Started on fluoxetine 20mg  po qd on 08/03/21 Doing well, no concerns Limits anxiety.   Sleep disturbance One caffeine through the day in morning Has been steady throughout starting fluoxetine.  Trouble falling asleep, not staying asleep Has not been on sleep aids in the past other than ambien - does not want this again.  Nausea At times with mounjaro Would like antiemetic. Has used prn zofran in the past.  Outpatient Medications Prior to Visit  Medication Sig Dispense Refill   amLODipine (NORVASC) 10 MG tablet Take 1 tablet (10 mg total) by mouth daily. 90 tablet 1   etodolac (LODINE) 200 MG capsule Take 1 capsule (200 mg total) by mouth 3 (three) times daily as needed (headache). 30 capsule 3   tirzepatide (MOUNJARO) 10 MG/0.5ML Pen Inject 10 mg into the skin once a week. 6 mL 0   FLUoxetine (PROZAC) 20 MG capsule Take 1 capsule (20 mg total) by mouth daily. 90 capsule 0   MOUNJARO 7.5 MG/0.5ML Pen SMARTSIG:7.5 Milligram(s) SUB-Q Once a Week     No facility-administered medications prior to visit.    Review of Systems  Constitutional: Negative.   HENT: Negative.    Eyes: Negative.   Respiratory: Negative.    Cardiovascular: Negative.   Gastrointestinal: Negative.   Genitourinary: Negative.   Musculoskeletal: Negative.   Skin: Negative.   Neurological: Negative.   Psychiatric/Behavioral: Negative.    All other systems reviewed and are negative.     Objective:     BP 140/88   Pulse 75   Temp 98.3 F (36.8 C) (Temporal)   Resp 18   Ht 5\' 3"  (1.6 m)   Wt 242 lb (109.8 kg)   SpO2 99%   BMI 42.87 kg/m   Wt Readings from Last 3  Encounters:  09/09/21 242 lb (109.8 kg)  08/03/21 254 lb (115.2 kg)  06/16/20 277 lb 1.9 oz (125.7 kg)   Physical Exam Vitals and nursing note reviewed.  Constitutional:      General: She is not in acute distress.    Appearance: Normal appearance. She is normal weight. She is not ill-appearing, toxic-appearing or diaphoretic.  Cardiovascular:     Rate and Rhythm: Normal rate and regular rhythm.     Heart sounds: Normal heart sounds. No murmur heard.    No friction rub. No gallop.  Pulmonary:     Effort: Pulmonary effort is normal. No respiratory distress.     Breath sounds: Normal breath sounds. No stridor. No wheezing, rhonchi or rales.  Chest:     Chest wall: No tenderness.  Skin:    General: Skin is warm and dry.  Neurological:     General: No focal deficit present.     Mental Status: She is alert and oriented to person, place, and time. Mental status is at baseline.  Psychiatric:        Mood and Affect: Mood normal.        Behavior: Behavior normal.        Thought Content: Thought content normal.        Judgment: Judgment normal.     No results  found for any visits on 09/09/21.    The ASCVD Risk score (Arnett DK, et al., 2019) failed to calculate for the following reasons:   The 2019 ASCVD risk score is only valid for ages 79 to 22    Assessment & Plan:   Problem List Items Addressed This Visit       Other   GAD (generalized anxiety disorder)    Stable on fluoxetine.continue. med check in 1 year.      Relevant Medications   traZODone (DESYREL) 50 MG tablet   FLUoxetine (PROZAC) 20 MG capsule   Moderate episode of recurrent major depressive disorder (H[facility])    Stable on fluoxetine.continue. med check in 1 year.       Relevant Medications   traZODone (DESYREL) 50 MG tablet   FLUoxetine (PROZAC) 20 MG capsule   Sleep disturbance - Primary    Trazodone 25-50mg  po qhs prn Reviewed risks, benefits, and side effects, pt voices understanding.        Relevant Medications   traZODone (DESYREL) 50 MG tablet    Meds ordered this encounter  Medications   traZODone (DESYREL) 50 MG tablet    Sig: Take 0.5-1 tablets (25-50 mg total) by mouth at bedtime as needed for sleep.    Dispense:  30 tablet    Refill:  3    Order Specific Question:   Supervising Provider    Answer:   Carlota Raspberry, JEFFREY R [[id number]]   FLUoxetine (PROZAC) 20 MG capsule    Sig: Take 1 capsule (20 mg total) by mouth daily.    Dispense:  90 capsule    Refill:  3    Order Specific Question:   Supervising Provider    Answer:   Carlota Raspberry, JEFFREY R [[id number]]    Return in about 3 months (around 12/10/2021) for Chronic Conditions.   PLAN See problem based charting Zofran for nausea Return in 3 mo Patient encouraged to call clinic with any questions, comments, or concerns.   Maximiano Coss, NP

## 2021-09-09 NOTE — Assessment & Plan Note (Signed)
Stable on fluoxetine.continue. med check in 1 year.

## 2021-09-09 NOTE — Assessment & Plan Note (Signed)
Stable on fluoxetine.continue. med check in 1 year. 

## 2021-09-09 NOTE — Addendum Note (Signed)
Addended by: Janeece Agee on: 09/09/2021 01:10 PM   Modules accepted: Orders

## 2021-09-16 ENCOUNTER — Encounter: Payer: Self-pay | Admitting: Registered Nurse

## 2021-09-16 MED ORDER — MOUNJARO 7.5 MG/0.5ML ~~LOC~~ SOAJ
SUBCUTANEOUS | 0 refills | Status: DC
Start: 2021-09-16 — End: 2021-10-09

## 2021-10-09 ENCOUNTER — Other Ambulatory Visit: Payer: Self-pay | Admitting: Family Medicine

## 2021-10-11 MED ORDER — MOUNJARO 7.5 MG/0.5ML ~~LOC~~ SOAJ: SUBCUTANEOUS | 0 refills | Status: DC

## 2021-10-29 ENCOUNTER — Encounter: Payer: Self-pay | Admitting: Registered Nurse

## 2021-11-01 NOTE — Telephone Encounter (Signed)
Can pair etodolac with tylenol, otherwise would suggest visit.  Thanks,  Luan Pulling

## 2021-11-01 NOTE — Telephone Encounter (Signed)
Spoke w/ pt and advised that per Gerlene Burdock she can pair Tylenol and suggest a visit . Pt states if she has anymore headaches she will make an apt

## 2021-11-07 ENCOUNTER — Other Ambulatory Visit: Payer: Self-pay | Admitting: Registered Nurse

## 2021-11-07 DIAGNOSIS — E66813 Obesity, class 3: Secondary | ICD-10-CM

## 2021-11-08 ENCOUNTER — Other Ambulatory Visit: Payer: Self-pay

## 2021-11-27 DIAGNOSIS — R1013 Epigastric pain: Secondary | ICD-10-CM | POA: Insufficient documentation

## 2021-11-28 DIAGNOSIS — K831 Obstruction of bile duct: Secondary | ICD-10-CM | POA: Insufficient documentation

## 2021-12-16 ENCOUNTER — Ambulatory Visit: Admitting: Registered Nurse

## 2022-01-26 ENCOUNTER — Other Ambulatory Visit: Payer: Self-pay | Admitting: Family

## 2022-01-26 ENCOUNTER — Other Ambulatory Visit: Payer: Self-pay | Admitting: Lab

## 2022-01-26 DIAGNOSIS — I10 Essential (primary) hypertension: Secondary | ICD-10-CM

## 2022-01-26 MED ORDER — MOUNJARO 7.5 MG/0.5ML ~~LOC~~ SOAJ: SUBCUTANEOUS | 0 refills | Status: DC

## 2022-01-26 MED ORDER — MOUNJARO 10 MG/0.5ML ~~LOC~~ SOAJ: SUBCUTANEOUS | 0 refills | Status: DC

## 2022-01-26 MED ORDER — AMLODIPINE BESYLATE 10 MG PO TABS: 10.0000 mg | ORAL_TABLET | Freq: Every day | ORAL | 1 refills | Status: DC

## 2022-01-26 NOTE — Telephone Encounter (Signed)
Patient is requesting refill monjaro 10 mg could we do one more fill and I have also sent a follow up message in regard to need to establish with new PCP

## 2022-04-14 ENCOUNTER — Telehealth: Payer: Self-pay | Admitting: Registered Nurse

## 2022-04-14 NOTE — Telephone Encounter (Signed)
Error

## 2022-04-19 ENCOUNTER — Ambulatory Visit (INDEPENDENT_AMBULATORY_CARE_PROVIDER_SITE_OTHER): Admitting: Family

## 2022-04-19 VITALS — BP 124/70 | HR 83 | Temp 97.6°F | Ht 63.0 in | Wt 212.0 lb

## 2022-04-19 DIAGNOSIS — I10 Essential (primary) hypertension: Secondary | ICD-10-CM | POA: Diagnosis not present

## 2022-04-19 DIAGNOSIS — G479 Sleep disorder, unspecified: Secondary | ICD-10-CM | POA: Diagnosis not present

## 2022-04-19 LAB — COMPREHENSIVE METABOLIC PANEL
ALT: 14 U/L (ref 0–35)
AST: 14 U/L (ref 0–37)
Albumin: 4.3 g/dL (ref 3.5–5.2)
Alkaline Phosphatase: 90 U/L (ref 39–117)
BUN: 13 mg/dL (ref 6–23)
CO2: 27 mEq/L (ref 19–32)
Calcium: 9.4 mg/dL (ref 8.4–10.5)
Chloride: 102 mEq/L (ref 96–112)
Creatinine, Ser: 0.93 mg/dL (ref 0.40–1.20)
GFR: 77.6 mL/min (ref >60.00)
Glucose, Bld: 89 mg/dL (ref 70–99)
Potassium: 4 mEq/L (ref 3.5–5.1)
Sodium: 137 mEq/L (ref 135–145)
Total Bilirubin: 0.7 mg/dL (ref 0.2–1.2)
Total Protein: 7.9 g/dL (ref 6.0–8.3)

## 2022-04-19 LAB — CBC WITH DIFFERENTIAL/PLATELET
Basophils Absolute: 0 10*3/uL (ref 0.0–0.1)
Basophils Relative: 0.5 % (ref 0.0–3.0)
Eosinophils Absolute: 0.1 10*3/uL (ref 0.0–0.7)
Eosinophils Relative: 1.5 % (ref 0.0–5.0)
HCT: 41.2 % (ref 36.0–46.0)
Hemoglobin: 14.2 g/dL (ref 12.0–15.0)
Lymphocytes Relative: 24.2 % (ref 12.0–46.0)
Lymphs Abs: 1.8 10*3/uL (ref 0.7–4.0)
MCHC: 34.4 g/dL (ref 30.0–36.0)
MCV: 92.3 fl (ref 78.0–100.0)
Monocytes Absolute: 0.4 10*3/uL (ref 0.1–1.0)
Monocytes Relative: 5.3 % (ref 3.0–12.0)
Neutro Abs: 5.2 10*3/uL (ref 1.4–7.7)
Neutrophils Relative %: 68.5 % (ref 43.0–77.0)
Platelets: 388 10*3/uL (ref 150.0–400.0)
RBC: 4.46 Mil/uL (ref 3.87–5.11)
RDW: 13.3 % (ref 11.5–15.5)
WBC: 7.6 10*3/uL (ref 4.0–10.5)

## 2022-04-19 MED ORDER — TRAZODONE HCL 50 MG PO TABS: 25.0000 mg | ORAL_TABLET | Freq: Every evening | ORAL | 3 refills | Status: DC | PRN

## 2022-04-19 MED ORDER — AMLODIPINE BESYLATE 5 MG PO TABS: 5.0000 mg | ORAL_TABLET | Freq: Every day | ORAL | 3 refills | Status: DC

## 2022-04-20 ENCOUNTER — Other Ambulatory Visit: Payer: Self-pay | Admitting: Family

## 2022-04-20 ENCOUNTER — Telehealth: Payer: Self-pay | Admitting: Registered Nurse

## 2022-04-20 ENCOUNTER — Encounter: Payer: Self-pay | Admitting: Family

## 2022-04-20 MED ORDER — AMOXICILLIN-POT CLAVULANATE 875-125 MG PO TABS: 1.0000 | ORAL_TABLET | Freq: Two times a day (BID) | ORAL | 0 refills | Status: DC

## 2022-04-20 NOTE — Telephone Encounter (Signed)
Called pt she asked about the incorrect dose thought it was 10 or 15 notes she will call them and then call back immediately with mg dose

## 2022-04-20 NOTE — Telephone Encounter (Signed)
Caller name: ZIYA COONROD  On DPR?: Yes  Call back number: [phone] (mobile)  Provider they see: Maximiano Coss, NP  Reason for call: Patient had an appt with [facility] yesterday. Patient called to inform Justin Mend  about her right ear pain. Patient  wants to know what should she take for this.  Patient also stated that she would like for [facility] to send for Mounjaro 15 mg to  [facility], [address]

## 2022-04-20 NOTE — Telephone Encounter (Signed)
Pt saw you yesterday for weight loss/ obesity concerns, she is asking for RX of Mounjaro   Also notes ear pain and wants to know what to take for this however I don't know that this was discussed at the visit.   Please advise

## 2022-04-20 NOTE — Progress Notes (Signed)
Acute Office Visit  Subjective:     Patient ID: Sara Ramirez, female    DOB: [birth year], 42 y.o.   MRN: [id number]  Chief Complaint  Patient presents with  . Medication Refill    Pt is requesting a refill on trazodone and on monjaro   . Otalgia    Pt notes ear ache starting last night Rt side just wants it looked at     HPI Patient is in today requesting a refill on Mounjaro and Trazadone. She is doing well. Denies any concern. She is interested in possibly increasing her dosage of Mounjaro.   She also c/o right ear pain that has been present 2 days but better today. Denies any sneezing, coughing, or congestion. She has not been taking any medication  Review of Systems  Constitutional: Negative.   HENT:  Positive for ear pain. Negative for congestion, ear discharge and nosebleeds.   Respiratory: Negative.    Cardiovascular: Negative.   All other systems reviewed and are negative.   Past Medical History:  Diagnosis Date  . Anxiety   . Hypertension   . Kidney stone   . Migraines   . Mitral regurgitation   . Renal disorder    kidney stones  . Tricuspid regurgitation     Social History   Socioeconomic History  . Marital status: Married    Spouse name: Not on file  . Number of children: 1  . Years of education: Not on file  . Highest education level: Not on file  Occupational History  . Occupation: medical billing and coding  Tobacco Use  . Smoking status: Never  . Smokeless tobacco: Never  Vaping Use  . Vaping Use: Never used  Substance and Sexual Activity  . Alcohol use: No    Alcohol/week: 0.0 standard drinks of alcohol  . Drug use: No  . Sexual activity: Yes    Birth control/protection: None  Other Topics Concern  . Not on file  Social History Narrative   ** Merged History Encounter **       Divorced. Has one child [name]. Lives with boyfriend and his additional 3 children.  Associates degree. Insurance billing.  Drinks caffeine.  Wears her  seatbelt, smoke detector in the home. Feels safe in her relationships.   Social Determinants of Health   Financial Resource Strain: Not on file  Food Insecurity: Not on file  Transportation Needs: Not on file  Physical Activity: Not on file  Stress: Not on file  Social Connections: Not on file  Intimate Partner Violence: Not on file    Past Surgical History:  Procedure Laterality Date  . CHOLECYSTECTOMY N/A 06/17/2020   Procedure: LAPAROSCOPIC CHOLECYSTECTOMY;  Surgeon: Virl Cagey, MD;  Location: [facility];  Service: General;  Laterality: N/A;  . CYSTOSCOPY W/ URETERAL STENT PLACEMENT Right 01/03/2013   Procedure: CYSTOSCOPY WITH RIGHT RETROGRADE PYELOGRAM/ RIGHT URETERAL STENT PLACEMENT;  Surgeon: Reece Packer, MD;  Location: [facility];  Service: Urology;  Laterality: Right;  . LITHOTRIPSY      Family History  Problem Relation Age of Onset  . Diabetes Father   . Migraines Mother   . Heart disease Paternal Grandmother   . Stroke Paternal Grandmother   . Migraines Son   . Diabetes Maternal Grandfather     Allergies  Allergen Reactions  . Imitrex [Sumatriptan] Nausea Only  . Toradol [Ketorolac Tromethamine] Nausea And Vomiting    Current Outpatient Medications on File Prior to Visit  Medication Sig  Dispense Refill  . etodolac (LODINE) 200 MG capsule Take 1 capsule (200 mg total) by mouth 3 (three) times daily as needed (headache). 30 capsule 3  . FLUoxetine (PROZAC) 20 MG capsule Take 1 capsule (20 mg total) by mouth daily. 90 capsule 3  . ondansetron (ZOFRAN-ODT) 4 MG disintegrating tablet Take 1 tablet (4 mg total) by mouth every 8 (eight) hours as needed for nausea or vomiting. 10 tablet 0  . tirzepatide (MOUNJARO) 10 MG/0.5ML Pen INJECT 10 MG INTO THE SKIN ONCE A WEEK 12 mL 0   No current facility-administered medications on file prior to visit.    BP 124/70   Pulse 83   Temp 97.6 F (36.4 C) (Oral)   Ht 5\' 3"  (1.6 m)   Wt 212 lb (96.2 kg)   SpO2 97%    BMI 37.55 kg/m chart     Objective:    BP 124/70   Pulse 83   Temp 97.6 F (36.4 C) (Oral)   Ht 5\' 3"  (1.6 m)   Wt 212 lb (96.2 kg)   SpO2 97%   BMI 37.55 kg/m    Physical Exam Vitals and nursing note reviewed.  Constitutional:      Appearance: Normal appearance.  HENT:     Right Ear: Tympanic membrane, ear canal and external ear normal.     Left Ear: Tympanic membrane, ear canal and external ear normal.     Nose: Nose normal.  Cardiovascular:     Rate and Rhythm: Normal rate and regular rhythm.     Pulses: Normal pulses.     Heart sounds: Normal heart sounds.  Pulmonary:     Effort: Pulmonary effort is normal.     Breath sounds: Normal breath sounds.  Abdominal:     General: Abdomen is flat. Bowel sounds are normal.     Palpations: Abdomen is soft.  Musculoskeletal:        General: Normal range of motion.     Cervical back: Normal range of motion and neck supple.  Skin:    General: Skin is warm and dry.  Neurological:     General: No focal deficit present.     Mental Status: She is alert and oriented to person, place, and time.  Psychiatric:        Mood and Affect: Mood normal.        Behavior: Behavior normal.   Results for orders placed or performed in visit on 04/19/22  CMP  Result Value Ref Range   Sodium 137 135 - 145 mEq/L   Potassium 4.0 3.5 - 5.1 mEq/L   Chloride 102 96 - 112 mEq/L   CO2 27 19 - 32 mEq/L   Glucose, Bld 89 70 - 99 mg/dL   BUN 13 6 - 23 mg/dL   Creatinine, Ser 0.40 - 1.20 mg/dL   Total Bilirubin 0.7 0.2 - 1.2 mg/dL   Alkaline Phosphatase 90 39 - 117 U/L   AST 14 0 - 37 U/L   ALT 14 0 - 35 U/L   Total Protein 7.9 6.0 - 8.3 g/dL   Albumin 4.3 3.5 - 5.2 g/dL   GFR 04/21/22 2.20 mL/min   Calcium 9.4 8.4 - 10.5 mg/dL  CBC w/Diff  Result Value Ref Range   WBC 7.6 4.0 - 10.5 K/uL   RBC 4.46 3.87 - 5.11 Mil/uL   Hemoglobin 14.2 12.0 - 15.0 g/dL   HCT 25.42 >70.62 - 37.6 %   MCV 92.3 78.0 - 100.0 fl  MCHC 34.4 30.0 - 36.0 g/dL    RDW 13.3 11.5 - 15.5 %   Platelets 388.0 150.0 - 400.0 K/uL   Neutrophils Relative % 68.5 43.0 - 77.0 %   Lymphocytes Relative 24.2 12.0 - 46.0 %   Monocytes Relative 5.3 3.0 - 12.0 %   Eosinophils Relative 1.5 0.0 - 5.0 %   Basophils Relative 0.5 0.0 - 3.0 %   Neutro Abs 5.2 1.4 - 7.7 K/uL   Lymphs Abs 1.8 0.7 - 4.0 K/uL   Monocytes Absolute 0.4 0.1 - 1.0 K/uL   Eosinophils Absolute 0.1 0.0 - 0.7 K/uL   Basophils Absolute 0.0 0.0 - 0.1 K/uL        Assessment & Plan:   Problem List Items Addressed This Visit     Sleep disturbance   Relevant Medications   traZODone (DESYREL) 50 MG tablet   Other Relevant Orders   CMP (Completed)   CBC w/Diff (Completed)   Primary hypertension   Relevant Medications   amLODipine (NORVASC) 5 MG tablet   Other Relevant Orders   CMP (Completed)   CBC w/Diff (Completed)   Obesity, Class III, BMI 40-49.9 (morbid obesity) ([place]) - Primary   Relevant Orders   CMP (Completed)   CBC w/Diff (Completed)    Meds ordered this encounter  Medications  . traZODone (DESYREL) 50 MG tablet    Sig: Take 0.5-1 tablets (25-50 mg total) by mouth at bedtime as needed for sleep.    Dispense:  30 tablet    Refill:  3  . amLODipine (NORVASC) 5 MG tablet    Sig: Take 1 tablet (5 mg total) by mouth daily.    Dispense:  30 tablet    Refill:  3   Consider a dosage increase to 12.5 mg of Mounjaro if the pharmacy has it available. Renewed Trazadone  No follow-ups on file.  Kennyth Arnold, FNP

## 2022-04-21 NOTE — Telephone Encounter (Signed)
Called pt back and confirmed that pharmacy has 4 doses of the 12.5 right now if we can send it as soon as you are able it would be gretly appreciated

## 2022-04-21 NOTE — Telephone Encounter (Signed)
Initial Comment Caller states that she would like to speak with [facility]. Additional Comment Caller asks that Noah Delaine return her call. Disp. Time Disposition Final User 04/20/2022 5:01:20 PM General Information Provided Yes Limmie Patricia Call Closed By: Limmie Patricia Transaction Date/Time: 04/20/2022 4:59:10 PM (ET)

## 2022-04-22 ENCOUNTER — Other Ambulatory Visit: Payer: Self-pay | Admitting: Family

## 2022-04-22 MED ORDER — TIRZEPATIDE 12.5 MG/0.5ML ~~LOC~~ SOAJ: 12.5000 mg | SUBCUTANEOUS | 1 refills | Status: DC

## 2022-05-10 ENCOUNTER — Encounter: Payer: Self-pay | Admitting: Family Medicine

## 2022-05-10 ENCOUNTER — Ambulatory Visit (INDEPENDENT_AMBULATORY_CARE_PROVIDER_SITE_OTHER): Admitting: Family Medicine

## 2022-05-10 VITALS — BP 124/78 | HR 82 | Temp 97.8°F | Ht 63.0 in | Wt 211.0 lb

## 2022-05-10 DIAGNOSIS — G43709 Chronic migraine without aura, not intractable, without status migrainosus: Secondary | ICD-10-CM

## 2022-05-10 MED ORDER — ETODOLAC 200 MG PO CAPS: 200.0000 mg | ORAL_CAPSULE | Freq: Three times a day (TID) | ORAL | 3 refills | Status: DC

## 2022-05-10 NOTE — Progress Notes (Unsigned)
New Patient Office Visit  Subjective    Patient ID: Sara Ramirez, female    DOB: Mar 03, 1981  Age: 42 y.o. MRN: [id number]  CC:  Chief Complaint  Patient presents with   Establish Care    Pt notes increase in the frequency of migraines over the last week with nausea as well other wise no changes     HPI Sara Ramirez presents to establish care with new provider and having increase in migraines. She has a history of migraines since she was a child. She reports in the last week she has been experiencing a daily headache that is more often than what she normally has. Previously she reports she has went "a long time" without having a migraine.  She reports she is waking up with a migraine. She reports it is around her eyes and base of skull. Described as aching and pressure. Rates pain 6/10. Associated symptoms nausea, vomiting once last night, light sensitivity, and intermittent blurry vision.  She reports she has been taking Lodine as needed and OTC Excedrin with no relief. She reports she has to go to sleep to relief the migraine.  She reports she started wearing an old pair of glasses with prescription lenses from 2014 about 2 weeks because she started using the computer more often, experiencing blurry vision. She reports she does have an appointment with [place] [facility] in [place] on Thursday.   She reports she previously took Indomethacin daily with relief back when she was in highschool. Also, was given a medication that she does not remember the name of from a provider in [place] that caused some side effects.  Denies having a CT or MRI of her brain.  Denies ever seeing a neurologist.   She reports she drinks about 20 oz of fluids a day, between [facility] and water. Outpatient Encounter Medications as of 05/10/2022  Medication Sig   amLODipine (NORVASC) 5 MG tablet Take 1 tablet (5 mg total) by mouth daily.   etodolac (LODINE) 200 MG capsule Take 1 capsule (200  mg total) by mouth 3 (three) times daily as needed (headache).   FLUoxetine (PROZAC) 20 MG capsule Take 1 capsule (20 mg total) by mouth daily.   ondansetron (ZOFRAN-ODT) 4 MG disintegrating tablet Take 1 tablet (4 mg total) by mouth every 8 (eight) hours as needed for nausea or vomiting.   tirzepatide (MOUNJARO) 12.5 MG/0.5ML Pen Inject 12.5 mg into the skin once a week.   traZODone (DESYREL) 50 MG tablet Take 0.5-1 tablets (25-50 mg total) by mouth at bedtime as needed for sleep.   [DISCONTINUED] amoxicillin-clavulanate (AUGMENTIN) 875-125 MG tablet Take 1 tablet by mouth 2 (two) times daily.   No facility-administered encounter medications on file as of 05/10/2022.    Past Medical History:  Diagnosis Date   Anxiety    Hypertension    Kidney stone    Migraines    Mitral regurgitation    Renal disorder    kidney stones   Tricuspid regurgitation     Past Surgical History:  Procedure Laterality Date   CHOLECYSTECTOMY N/A 06/17/2020   Procedure: LAPAROSCOPIC CHOLECYSTECTOMY;  Surgeon: Virl Cagey, MD;  Location: [facility];  Service: General;  Laterality: N/A;   CYSTOSCOPY W/ URETERAL STENT PLACEMENT Right 01/03/2013   Procedure: CYSTOSCOPY WITH RIGHT RETROGRADE PYELOGRAM/ RIGHT URETERAL STENT PLACEMENT;  Surgeon: Reece Packer, MD;  Location: [facility];  Service: Urology;  Laterality: Right;   LITHOTRIPSY  Family History  Problem Relation Age of Onset   Diabetes Father    Migraines Mother    Heart disease Paternal Grandmother    Stroke Paternal Grandmother    Migraines Son    Diabetes Maternal Grandfather     Social History   Socioeconomic History   Marital status: Married    Spouse name: Not on file   Number of children: 1   Years of education: Not on file   Highest education level: Not on file  Occupational History   Occupation: medical billing and coding  Tobacco Use   Smoking status: Never   Smokeless tobacco: Never  Vaping Use   Vaping Use: Never used   Substance and Sexual Activity   Alcohol use: No    Alcohol/week: 0.0 standard drinks of alcohol   Drug use: No   Sexual activity: Yes    Birth control/protection: None  Other Topics Concern   Not on file  Social History Narrative   ** Merged History Encounter **       Divorced. Has one child Sara Ramirez. Lives with boyfriend and his additional 3 children.  Associates degree. Insurance billing.  Drinks caffeine.  Wears her seatbelt, smoke detector in the home. Feels safe in her relationships.   Social Determinants of Health   Financial Resource Strain: Not on file  Food Insecurity: Not on file  Transportation Needs: Not on file  Physical Activity: Not on file  Stress: Not on file  Social Connections: Not on file  Intimate Partner Violence: Not on file    ROS See HPI above     Objective    BP 124/78   Pulse 82   Temp 97.8 F (36.6 C) (Temporal)   Ht 5\' 3"  (1.6 m)   Wt 211 lb (95.7 kg)   SpO2 97%   BMI 37.38 kg/m   Physical Exam Vitals reviewed.  Constitutional:      General: She is not in acute distress.    Appearance: Normal appearance. She is not ill-appearing or toxic-appearing.  HENT:     Head: Normocephalic and atraumatic.     Right Ear: Tympanic membrane, ear canal and external ear normal.     Left Ear: Tympanic membrane, ear canal and external ear normal.  Eyes:     General:        Right eye: No discharge.        Left eye: No discharge.     Conjunctiva/sclera: Conjunctivae normal.  Cardiovascular:     Rate and Rhythm: Normal rate and regular rhythm.     Heart sounds: Normal heart sounds. No murmur heard.    No friction rub. No gallop.  Pulmonary:     Effort: Pulmonary effort is normal. No respiratory distress.     Breath sounds: Normal breath sounds.  Skin:    General: Skin is warm and dry.  Neurological:     General: No focal deficit present.     Mental Status: She is alert and oriented to person, place, and time. Mental status is at baseline.      Cranial Nerves: No facial asymmetry.  Psychiatric:        Mood and Affect: Mood normal.        Behavior: Behavior normal.        Thought Content: Thought content normal.        Judgment: Judgment normal.     {Labs (Optional):23779}    Assessment & Plan:   There are no diagnoses linked to this  encounter.  No follow-ups on file.   Valarie Merino, NP

## 2022-05-10 NOTE — Patient Instructions (Signed)
It was nice to meet you today and look forward to caring for you. Ordered CT head scan without contrast.  Recommend to increase fluid intake, preferably water at least 64 oz a day. Recommend to increase Lodine to 2 tablets in the AM, 1 tablet in the afternoon, and 2 tablets in the evening for 5 days. Recommend to eat when taking medication. Do not exceed 1,000mg  a day of medication.  You are being referred to neurology.  If you have the worst headache of your life, please go to the nearest emergency department.

## 2022-05-12 NOTE — Assessment & Plan Note (Signed)
Recommend start with increasing her fluid intake, preferably water or sugar free fluids to at least 64 oz a day. Recommend to increase Lodine 200mg  tablets, 2 tablets in the AM, 1 in the afternoon, and 2 tablets in the evening for 5 days to see if this will help break the migraine cycle. Recommend to eat food when taking medication. Will refer to neurology for increase in severity of migraines and has a long history of migraines with trying several medications. In the meantime, ordered a non contrast CT head scan. Informed if headache become the worst headache of her life, she needed to go the emergency department.

## 2022-05-14 ENCOUNTER — Encounter: Payer: Self-pay | Admitting: Family Medicine

## 2022-05-16 MED ORDER — TIRZEPATIDE 10 MG/0.5ML ~~LOC~~ SOAJ: 10.0000 mg | SUBCUTANEOUS | 1 refills | Status: DC

## 2022-05-16 NOTE — Telephone Encounter (Signed)
You saw pt 05/10/22 mainly for migraines, but pt is going to run out of mounjaro due to shortage can you send 10 mg rx as temporary fill due to this issue?  Please advise

## 2022-05-19 ENCOUNTER — Encounter: Payer: Self-pay | Admitting: Neurology

## 2022-05-23 ENCOUNTER — Ambulatory Visit (HOSPITAL_BASED_OUTPATIENT_CLINIC_OR_DEPARTMENT_OTHER)
Admission: RE | Admit: 2022-05-23 | Discharge: 2022-05-23 | Disposition: A | Discharge status: Home / Self Care | Source: Ambulatory Visit | Attending: Family Medicine | Admitting: Family Medicine

## 2022-05-23 DIAGNOSIS — G43709 Chronic migraine without aura, not intractable, without status migrainosus: Secondary | ICD-10-CM | POA: Insufficient documentation

## 2022-05-24 DIAGNOSIS — I10 Essential (primary) hypertension: Secondary | ICD-10-CM | POA: Insufficient documentation

## 2022-06-07 DIAGNOSIS — N200 Calculus of kidney: Secondary | ICD-10-CM | POA: Insufficient documentation

## 2022-06-07 DIAGNOSIS — G43909 Migraine, unspecified, not intractable, without status migrainosus: Secondary | ICD-10-CM | POA: Insufficient documentation

## 2022-06-07 DIAGNOSIS — R112 Nausea with vomiting, unspecified: Secondary | ICD-10-CM | POA: Insufficient documentation

## 2022-06-08 ENCOUNTER — Ambulatory Visit: Admitting: Family Medicine

## 2022-06-27 DIAGNOSIS — M2550 Pain in unspecified joint: Secondary | ICD-10-CM | POA: Insufficient documentation

## 2022-06-27 DIAGNOSIS — E559 Vitamin D deficiency, unspecified: Secondary | ICD-10-CM | POA: Insufficient documentation

## 2022-06-27 DIAGNOSIS — F39 Unspecified mood [affective] disorder: Secondary | ICD-10-CM | POA: Insufficient documentation

## 2022-06-27 DIAGNOSIS — N951 Menopausal and female climacteric states: Secondary | ICD-10-CM | POA: Insufficient documentation

## 2022-06-27 DIAGNOSIS — Z789 Other specified health status: Secondary | ICD-10-CM | POA: Insufficient documentation

## 2022-06-27 DIAGNOSIS — R6882 Decreased libido: Secondary | ICD-10-CM | POA: Insufficient documentation

## 2022-06-27 DIAGNOSIS — R5383 Other fatigue: Secondary | ICD-10-CM | POA: Insufficient documentation

## 2022-07-19 ENCOUNTER — Encounter: Payer: Self-pay | Admitting: Family Medicine

## 2022-08-26 ENCOUNTER — Encounter: Payer: Self-pay | Admitting: Family Medicine

## 2022-08-26 ENCOUNTER — Other Ambulatory Visit: Payer: Self-pay | Admitting: Family Medicine

## 2022-08-30 ENCOUNTER — Other Ambulatory Visit: Payer: Self-pay

## 2022-08-30 DIAGNOSIS — I10 Essential (primary) hypertension: Secondary | ICD-10-CM

## 2022-08-30 MED ORDER — AMLODIPINE BESYLATE 5 MG PO TABS: 5.0000 mg | ORAL_TABLET | Freq: Every day | ORAL | 0 refills | Status: DC

## 2022-08-31 ENCOUNTER — Other Ambulatory Visit: Payer: Self-pay

## 2022-08-31 ENCOUNTER — Telehealth: Payer: Self-pay

## 2022-08-31 DIAGNOSIS — G43709 Chronic migraine without aura, not intractable, without status migrainosus: Secondary | ICD-10-CM

## 2022-08-31 DIAGNOSIS — I10 Essential (primary) hypertension: Secondary | ICD-10-CM

## 2022-08-31 MED ORDER — AMLODIPINE BESYLATE 5 MG PO TABS: 5.0000 mg | ORAL_TABLET | Freq: Every day | ORAL | 0 refills | Status: DC

## 2022-08-31 MED ORDER — ETODOLAC 200 MG PO CAPS: 200.0000 mg | ORAL_CAPSULE | Freq: Three times a day (TID) | ORAL | 3 refills | Status: DC

## 2022-08-31 NOTE — Telephone Encounter (Signed)
Called left vm to call and make appt  °

## 2022-08-31 NOTE — Telephone Encounter (Signed)
Spoke with pt and she is scheduled. Refilled  etodolac (LODINE) 200 MG capsule and Amlodipine 5 mg.

## 2022-08-31 NOTE — Telephone Encounter (Signed)
Left vm to call and schedule appt.

## 2022-09-02 ENCOUNTER — Telehealth: Payer: Self-pay

## 2022-09-02 ENCOUNTER — Other Ambulatory Visit: Payer: Self-pay

## 2022-09-02 ENCOUNTER — Ambulatory Visit: Admitting: Family Medicine

## 2022-09-02 ENCOUNTER — Other Ambulatory Visit: Payer: Self-pay | Admitting: Family Medicine

## 2022-09-02 DIAGNOSIS — G43709 Chronic migraine without aura, not intractable, without status migrainosus: Secondary | ICD-10-CM

## 2022-09-02 DIAGNOSIS — I10 Essential (primary) hypertension: Secondary | ICD-10-CM

## 2022-09-02 MED ORDER — ETODOLAC 200 MG PO CAPS: 200.0000 mg | ORAL_CAPSULE | Freq: Three times a day (TID) | ORAL | 0 refills | Status: AC

## 2022-09-02 MED ORDER — ETODOLAC 200 MG PO CAPS: 200.0000 mg | ORAL_CAPSULE | Freq: Three times a day (TID) | ORAL | 0 refills | Status: DC

## 2022-09-02 NOTE — Progress Notes (Deleted)
   Established Patient Office Visit   Subjective:  Patient ID: Sara Ramirez, female    DOB: 11/23/1980  Age: 42 y.o. MRN: [id number]  No chief complaint on file.   HPI HTN: Chronic. Patient is taking Amlodipine 5mg  daily.   Anxiety: Chronic. Patient is taking Fluoxetine 20mg  daily.   Insomnia: Chronic. Patient is taking Trazodone 25-50mg  tablet.   Obesity: Patient is taking Mounjaro   ROS See HPI above     Objective:     There were no vitals taken for this visit. {Vitals History (Optional):23777}  Physical Exam  No results found for any visits on 09/02/22.  The ASCVD Risk score (Arnett DK, et al., 2019) failed to calculate for the following reasons:   The 2019 ASCVD risk score is only valid for ages 31 to 40    Assessment & Plan:  Primary hypertension    No follow-ups on file.   Zandra Abts, NP

## 2022-09-02 NOTE — Telephone Encounter (Signed)
Pt is aware.  

## 2022-09-14 ENCOUNTER — Ambulatory Visit: Admitting: Family Medicine

## 2022-09-14 DIAGNOSIS — G43009 Migraine without aura, not intractable, without status migrainosus: Secondary | ICD-10-CM

## 2022-09-14 NOTE — Progress Notes (Deleted)
NEUROLOGY CONSULTATION NOTE  Sara Ramirez MRN: [id number] DOB: 04-08-1980  Referring provider: Alveria Apley, NP Primary care provider: Sima Matas, NP  Reason for consult:  migraines  Assessment/Plan:   ***   Subjective:  Sara Ramirez is a 42 year old female with HTN, mitral regurgitation, tricuspid regurgitation, anxiety and history of kidney stones who presents for migraines.  History supplemented by referring provider's note.  Onset:  *** Location:  *** Quality:  *** Intensity:  ***.  *** denies new headache, thunderclap headache or severe headache that wakes *** from sleep. Aura:  *** Prodrome:  *** Postdrome:  *** Associated symptoms:  ***.  *** denies associated unilateral numbness or weakness. Duration:  *** Frequency:  *** Frequency of abortive medication: *** Triggers:  *** Relieving factors:  *** Activity:  ***  CT Head on 05/23/2022 personally reviewed was normal.  Past NSAIDS/analgesics:  Fioricet, etodolac Past abortive triptans:  sumatriptan tab Past abortive ergotamine:  none Past muscle relaxants:  none Past anti-emetic:  Reglan, promethazine Past antihypertensive medications:  propranolol, diltiazem, HCTZ, lisinopril Past antidepressant medications:  sertraline Past anticonvulsant medications:  *** Past anti-CGRP:  *** Past vitamins/Herbal/Supplements:  B12 Past antihistamines/decongestants:  Zyrtec Other past therapies:  ***  Current NSAIDS/analgesics:  *** Current triptans:  none Current ergotamine:  none Current anti-emetic:  Zofran-ODT 4mg  Current muscle relaxants:  none Current Antihypertensive medications:  amlodipine Current Antidepressant medications:  fluoxetine 20mg  daily Current Anticonvulsant medications:  nonoe Current anti-CGRP:  none Current Vitamins/Herbal/Supplements:  none Current Antihistamines/Decongestants:  none Other therapy:  *** Other medications:  trazodone   Caffeine:  *** Alcohol:   *** Smoker:  *** Diet:  *** Exercise:  *** Depression:  ***; Anxiety:  *** Other pain:  *** Sleep hygiene:  *** Family history of headache:  ***      PAST MEDICAL HISTORY: Past Medical History:  Diagnosis Date   Anxiety    Hypertension    Kidney stone    Migraines    Mitral regurgitation    Renal disorder    kidney stones   Tricuspid regurgitation     PAST SURGICAL HISTORY: Past Surgical History:  Procedure Laterality Date   CHOLECYSTECTOMY N/A 06/17/2020   Procedure: LAPAROSCOPIC CHOLECYSTECTOMY;  Surgeon: Lucretia Roers, MD;  Location: [facility];  Service: General;  Laterality: N/A;   CYSTOSCOPY W/ URETERAL STENT PLACEMENT Right 01/03/2013   Procedure: CYSTOSCOPY WITH RIGHT RETROGRADE PYELOGRAM/ RIGHT URETERAL STENT PLACEMENT;  Surgeon: Martina Sinner, MD;  Location: [facility];  Service: Urology;  Laterality: Right;   LITHOTRIPSY     TYMPANOSTOMY TUBE PLACEMENT Bilateral 2002    MEDICATIONS: Current Outpatient Medications on File Prior to Visit  Medication Sig Dispense Refill   amLODipine (NORVASC) 5 MG tablet Take 1 tablet (5 mg total) by mouth daily. 30 tablet 0   etodolac (LODINE) 200 MG capsule Take 1 capsule (200 mg total) by mouth in the morning, at noon, and at bedtime. 90 capsule 0   FLUoxetine (PROZAC) 20 MG capsule Take 1 capsule (20 mg total) by mouth daily. 90 capsule 3   FLUoxetine (PROZAC) 20 MG capsule Take 1 capsule by mouth daily.     influenza vac split quadrivalent PF (FLULAVAL QUADRIVALENT) 0.5 ML injection 0.5 mLs.     MOUNJARO 7.5 MG/0.5ML Pen SMARTSIG:7.5 Milligram(s) SUB-Q Once a Week     ondansetron (ZOFRAN-ODT) 4 MG disintegrating tablet Take 1 tablet (4 mg total) by mouth every 8 (eight) hours as needed for nausea  or vomiting. 10 tablet 0   pneumococcal 20-valent conjugate vaccine (PREVNAR 20) 0.5 ML injection 0.5 mLs.     tirzepatide (MOUNJARO) 10 MG/0.5ML Pen Inject 10 mg into the skin once a week. 2 mL 1   tirzepatide (MOUNJARO) 10  MG/0.5ML Pen Inject into the skin.     tirzepatide (MOUNJARO) 12.5 MG/0.5ML Pen Inject 12.5 mg into the skin once a week. 6 mL 1   traZODone (DESYREL) 50 MG tablet Take 0.5-1 tablets (25-50 mg total) by mouth at bedtime as needed for sleep. 30 tablet 3   No current facility-administered medications on file prior to visit.    ALLERGIES: Allergies  Allergen Reactions   Imitrex [Sumatriptan] Nausea Only   Toradol [Ketorolac Tromethamine] Nausea And Vomiting    FAMILY HISTORY: Family History  Problem Relation Age of Onset   Diabetes Father    Diabetes Maternal Grandfather    Heart disease Paternal Grandmother    Stroke Paternal Grandmother    Migraines Ramirez     Objective:  *** General: No acute distress.  Patient appears well-groomed.   Head:  Normocephalic/atraumatic Eyes:  fundi examined but not visualized Neck: supple, no paraspinal tenderness, full range of motion Back: No paraspinal tenderness Heart: regular rate and rhythm Lungs: Clear to auscultation bilaterally. Vascular: No carotid bruits. Neurological Exam: Mental status: alert and oriented to person, place, and time, speech fluent and not dysarthric, language intact. Cranial nerves: CN I: not tested CN II: pupils equal, round and reactive to light, visual fields intact CN III, IV, VI:  full range of motion, no nystagmus, no ptosis CN V: facial sensation intact. CN VII: upper and lower face symmetric CN VIII: hearing intact CN IX, X: gag intact, uvula midline CN XI: sternocleidomastoid and trapezius muscles intact CN XII: tongue midline Bulk & Tone: normal, no fasciculations. Motor:  muscle strength 5/5 throughout Sensation:  Pinprick, temperature and vibratory sensation intact. Deep Tendon Reflexes:  2+ throughout,  toes downgoing.   Finger to nose testing:  Without dysmetria.   Heel to shin:  Without dysmetria.   Gait:  Normal station and stride.  Romberg negative.    Thank you for allowing me to take  part in the care of this patient.  Shon Millet, DO  CC: ***

## 2022-09-19 ENCOUNTER — Ambulatory Visit: Admitting: Neurology

## 2022-11-16 NOTE — Progress Notes (Signed)
Established Patient Office Visit   Subjective:  Patient ID: Sara Ramirez, female    DOB: 03/23/1981  Age: 42 y.o. MRN: [id number]  Chief Complaint  Patient presents with   Follow-up    Pt is here today for refill  Pt is FASTING Pt reports she has no other concerns.    HPI HTN: Chronic. Patient is prescribed Amlodipine 5mg  daily. She reports she has been monitoring her blood pressure only when she has symptoms of headaches, dizziness, lightheadedness, or when upset. She reports that is 1-2 times a week. Ranging 160-180/90 or above. Denies chest pain, shortness of breath, or lower extremity edema. She has previously been on 10mg  dose.  BP Readings from Last 3 Encounters:  11/18/22 122/80  05/10/22 124/78  04/19/22 124/70    Sleep disturbance: Chronic. Patient is prescribed 50mg  tablet, 1/2 to 1 tablet as needed at bedtime. She reports she is taking medication every night. She reports she is taking 50mg  nightly and reports she does not feel like it is helping go to sleep.   GAD/Depression: Chronic. Patient is prescribed Fluoxetine 20mg  daily. Started medication on 08/03/2021. Effective. Denies SI or HI.   Weight loss: Patient is taking Mounjaro 12.5mg  weekly injections. She reports her last injection was last week. Patient reports she gets nausea after injections, 1-2 days. She reports she takes Zofran as needed.  Wt Readings from Last 3 Encounters:  11/18/22 216 lb 4 oz (98.1 kg)  05/10/22 211 lb (95.7 kg)  04/19/22 212 lb (96.2 kg)    ROS See HPI above     Objective:   BP 122/80   Pulse 60   Temp 97.9 F (36.6 C)   Ht 5\' 3"  (1.6 m)   Wt 216 lb 4 oz (98.1 kg)   SpO2 100%   BMI 38.31 kg/m    Physical Exam Vitals reviewed.  Constitutional:      General: She is not in acute distress.    Appearance: Normal appearance. She is obese. She is not ill-appearing, toxic-appearing or diaphoretic.  HENT:     Head: Normocephalic and atraumatic.  Eyes:     General:         Right eye: No discharge.        Left eye: No discharge.     Conjunctiva/sclera: Conjunctivae normal.  Cardiovascular:     Rate and Rhythm: Normal rate and regular rhythm.     Heart sounds: Normal heart sounds. No murmur heard.    No friction rub. No gallop.  Pulmonary:     Effort: Pulmonary effort is normal. No respiratory distress.     Breath sounds: Normal breath sounds.  Musculoskeletal:        General: Normal range of motion.  Skin:    General: Skin is warm and dry.  Neurological:     General: No focal deficit present.     Mental Status: She is alert and oriented to person, place, and time. Mental status is at baseline.  Psychiatric:        Mood and Affect: Mood normal.        Behavior: Behavior normal.        Thought Content: Thought content normal.        Judgment: Judgment normal.      Assessment & Plan:  Primary hypertension Assessment & Plan: Blood pressure is stable in office with taking Amlodipine 5mg  daily. However, patient reports she is having elevated readings at home with symptoms of headaches, dizziness, and lightheadedness.  Recommend patient to monitor her blood pressure twice a day for 2 weeks and upload her readings on MyChart. This provider will review what is uploaded and provide further treatment if needed. Ordered CMP to assess kidney function. Refilled Amlodipine.   Orders: -     amLODIPine Besylate; Take 1 tablet (5 mg total) by mouth daily.  Dispense: 90 tablet; Refill: 1 -     Comprehensive metabolic panel  Sleep disturbance Assessment & Plan: Discontinue 50mg  tablet and prescribed Trazodone 100mg  as needed at bedtime. Patient reports the 50mg  is not as effective. Advised patient to send a MyChart message in 30 days to provide an update on how she is feeling with the increase in medication.   Orders: -     traZODone HCl; Take 1 tablet (100 mg total) by mouth at bedtime as needed for up to 30 doses for sleep.  Dispense: 30 tablet; Refill: 0  GAD  (generalized anxiety disorder) Assessment & Plan: Continue with Fluoxetine 20mg  daily. She reports it is effective. Denies SI or HI. Refilled medication.  Orders: -     FLUoxetine HCl; Take 1 capsule (20 mg total) by mouth daily for 90 doses.  Dispense: 30 capsule; Refill: 2  Moderate episode of recurrent major depressive disorder [facility]) Assessment & Plan: Continue with Fluoxetine 20mg  daily since it is effective. Denies SI or HI. Refilled medication.   Orders: -     FLUoxetine HCl; Take 1 capsule (20 mg total) by mouth daily for 90 doses.  Dispense: 30 capsule; Refill: 2  Need for hepatitis C screening test -     Hepatitis C antibody  Screening for endocrine, metabolic and immunity disorder -     CBC with Differential/Platelet -     TSH  Nausea and vomiting, unspecified vomiting type -     Ondansetron; Take 1 tablet (4 mg total) by mouth every 8 (eight) hours as needed for nausea or vomiting.  Dispense: 10 tablet; Refill: 0  Need for diphtheria-tetanus-pertussis (Tdap) vaccine -     Tdap vaccine greater than or equal to 7yo IM  Cervical cancer screening -     Ambulatory referral to Obstetrics / Gynecology  Obesity, Class II, BMI 35-39.9 -     Zepbound; Inject 12.5 mg into the skin once a week for 4 doses.  Dispense: 2 mL; Refill: 0  Lipid screening -     Lipid panel   1.Review health maintenance: -Pap smear-Placed a referral to GYN for pap smear -Influenza vaccine-declines vaccine for the season -Tdap-order and administered today  -Covid vaccine/booster-declines vaccine/booster -Ordered Hep C screening.  2.Ordered screening labs: CBC, CMP, and lipid panel.  3.Discontinue Mounjaro 12.5mg  weekly injections since patient does not have diabetes. Her last A1c was 4.9 on 08/03/2021. Prescribed Zepbound 12.5mg  weekly injections since this medication is approved for weight loss.  Return in about 3 months (around 02/18/2023) for chronic management.   Zandra Abts, NP

## 2022-11-18 ENCOUNTER — Ambulatory Visit (INDEPENDENT_AMBULATORY_CARE_PROVIDER_SITE_OTHER): Admitting: Family Medicine

## 2022-11-18 ENCOUNTER — Encounter: Payer: Self-pay | Admitting: Family Medicine

## 2022-11-18 VITALS — BP 122/80 | HR 60 | Temp 97.9°F | Ht 63.0 in | Wt 216.2 lb

## 2022-11-18 DIAGNOSIS — Z1329 Encounter for screening for other suspected endocrine disorder: Secondary | ICD-10-CM

## 2022-11-18 DIAGNOSIS — Z1159 Encounter for screening for other viral diseases: Secondary | ICD-10-CM

## 2022-11-18 DIAGNOSIS — E669 Obesity, unspecified: Secondary | ICD-10-CM

## 2022-11-18 DIAGNOSIS — Z13220 Encounter for screening for lipoid disorders: Secondary | ICD-10-CM

## 2022-11-18 DIAGNOSIS — F331 Major depressive disorder, recurrent, moderate: Secondary | ICD-10-CM | POA: Diagnosis not present

## 2022-11-18 DIAGNOSIS — Z13228 Encounter for screening for other metabolic disorders: Secondary | ICD-10-CM

## 2022-11-18 DIAGNOSIS — Z124 Encounter for screening for malignant neoplasm of cervix: Secondary | ICD-10-CM

## 2022-11-18 DIAGNOSIS — G479 Sleep disorder, unspecified: Secondary | ICD-10-CM | POA: Diagnosis not present

## 2022-11-18 DIAGNOSIS — F411 Generalized anxiety disorder: Secondary | ICD-10-CM | POA: Diagnosis not present

## 2022-11-18 DIAGNOSIS — Z23 Encounter for immunization: Secondary | ICD-10-CM | POA: Diagnosis not present

## 2022-11-18 DIAGNOSIS — Z130 Encounter for screening for diseases of the blood and blood-forming organs and certain disorders involving the immune mechanism: Secondary | ICD-10-CM | POA: Diagnosis not present

## 2022-11-18 DIAGNOSIS — R112 Nausea with vomiting, unspecified: Secondary | ICD-10-CM

## 2022-11-18 DIAGNOSIS — E66812 Obesity, class 2: Secondary | ICD-10-CM

## 2022-11-18 DIAGNOSIS — I10 Essential (primary) hypertension: Secondary | ICD-10-CM

## 2022-11-18 LAB — COMPREHENSIVE METABOLIC PANEL
ALT: 19 U/L (ref 0–35)
AST: 20 U/L (ref 0–37)
Albumin: 3.9 g/dL (ref 3.5–5.2)
Alkaline Phosphatase: 74 U/L (ref 39–117)
BUN: 16 mg/dL (ref 6–23)
CO2: 27 mEq/L (ref 19–32)
Calcium: 9 mg/dL (ref 8.4–10.5)
Chloride: 101 mEq/L (ref 96–112)
Creatinine, Ser: 0.76 mg/dL (ref 0.40–1.20)
GFR: 98.47 mL/min (ref >60.00)
Glucose, Bld: 78 mg/dL (ref 70–99)
Potassium: 4 mEq/L (ref 3.5–5.1)
Sodium: 134 mEq/L — ABNORMAL LOW (ref 135–145)
Total Bilirubin: 0.4 mg/dL (ref 0.2–1.2)
Total Protein: 6.8 g/dL (ref 6.0–8.3)

## 2022-11-18 LAB — LIPID PANEL
Cholesterol: 151 mg/dL (ref 0–200)
HDL: 47 mg/dL (ref >39.00)
LDL Cholesterol: 91 mg/dL (ref 0–99)
NonHDL: 103.77
Total CHOL/HDL Ratio: 3
Triglycerides: 66 mg/dL (ref 0.0–149.0)
VLDL: 13.2 mg/dL (ref 0.0–40.0)

## 2022-11-18 LAB — CBC WITH DIFFERENTIAL/PLATELET
Basophils Absolute: 0 10*3/uL (ref 0.0–0.1)
Basophils Relative: 0.6 % (ref 0.0–3.0)
Eosinophils Absolute: 0.2 10*3/uL (ref 0.0–0.7)
Eosinophils Relative: 2.6 % (ref 0.0–5.0)
HCT: 38.4 % (ref 36.0–46.0)
Hemoglobin: 12.7 g/dL (ref 12.0–15.0)
Lymphocytes Relative: 35.8 % (ref 12.0–46.0)
Lymphs Abs: 2.1 10*3/uL (ref 0.7–4.0)
MCHC: 32.9 g/dL (ref 30.0–36.0)
MCV: 95.3 fl (ref 78.0–100.0)
Monocytes Absolute: 0.3 10*3/uL (ref 0.1–1.0)
Monocytes Relative: 5.1 % (ref 3.0–12.0)
Neutro Abs: 3.2 10*3/uL (ref 1.4–7.7)
Neutrophils Relative %: 55.9 % (ref 43.0–77.0)
Platelets: 299 10*3/uL (ref 150.0–400.0)
RBC: 4.03 Mil/uL (ref 3.87–5.11)
RDW: 13.6 % (ref 11.5–15.5)
WBC: 5.7 10*3/uL (ref 4.0–10.5)

## 2022-11-18 LAB — TSH: TSH: 1.59 u[IU]/mL (ref 0.35–5.50)

## 2022-11-18 MED ORDER — ONDANSETRON 4 MG PO TBDP
4.0000 mg | ORAL_TABLET | Freq: Three times a day (TID) | ORAL | 0 refills | Status: DC | PRN
Start: 2022-11-18 — End: 2023-08-15

## 2022-11-18 MED ORDER — FLUOXETINE HCL 20 MG PO CAPS
20.0000 mg | ORAL_CAPSULE | Freq: Every day | ORAL | 2 refills | Status: DC
Start: 2022-11-18 — End: 2023-05-16

## 2022-11-18 MED ORDER — ZEPBOUND 12.5 MG/0.5ML ~~LOC~~ SOAJ
12.5000 mg | SUBCUTANEOUS | 0 refills | Status: AC
Start: 2022-11-18 — End: 2022-12-10

## 2022-11-18 MED ORDER — AMLODIPINE BESYLATE 5 MG PO TABS
5.0000 mg | ORAL_TABLET | Freq: Every day | ORAL | 1 refills | Status: DC
Start: 2022-11-18 — End: 2023-10-04

## 2022-11-18 MED ORDER — TRAZODONE HCL 100 MG PO TABS: 100.0000 mg | ORAL_TABLET | Freq: Every evening | ORAL | 0 refills | Status: DC | PRN

## 2022-11-18 NOTE — Assessment & Plan Note (Signed)
Continue with Fluoxetine 20mg  daily since it is effective. Denies SI or HI. Refilled medication.

## 2022-11-18 NOTE — Assessment & Plan Note (Signed)
Discontinue 50mg  tablet and prescribed Trazodone 100mg  as needed at bedtime. Patient reports the 50mg  is not as effective. Advised patient to send a MyChart message in 30 days to provide an update on how she is feeling with the increase in medication.

## 2022-11-18 NOTE — Assessment & Plan Note (Signed)
Continue with Fluoxetine 20mg  daily. She reports it is effective. Denies SI or HI. Refilled medication.

## 2022-11-18 NOTE — Patient Instructions (Addendum)
-  Ordered labs based on screening and medical history. Office will call with lab results and you may see them on MyChart. -Tdap vaccine was administered today. -Blood pressure was stable today, but concerns of elevated blood pressure based on reported information. Recommend to monitor your blood pressure twice a day for 2 weeks and upload to MyChart. This provider will review what is uploaded and provide further treatment if needed. Refilled Amlodipine.  -Placed a referral to GYN for pap smear. If you do not hear back about an appointment in 2 weeks from a phone call or MyChart, please call the office.  -Refilled Zofran for nausea as needed. -Discontinue Mounjaro. Prescribed Zepbound 12.5mg  weekly injections for weight loss.  -Refill Prozac.  -Increase Trazodone to 100mg  at bedtime as needed. Please send a MyChart message at the end of prescription to provide information on how you are doing this dose.  -Follow up in 3 months.

## 2022-11-18 NOTE — Assessment & Plan Note (Addendum)
Blood pressure is stable in office with taking Amlodipine 5mg  daily. However, patient reports she is having elevated readings at home with symptoms of headaches, dizziness, and lightheadedness. Recommend patient to monitor her blood pressure twice a day for 2 weeks and upload her readings on MyChart. This provider will review what is uploaded and provide further treatment if needed. Ordered CMP to assess kidney function. Refilled Amlodipine.

## 2022-11-19 LAB — HEPATITIS C ANTIBODY: Hepatitis C Ab: NONREACTIVE

## 2022-11-21 ENCOUNTER — Telehealth: Payer: Self-pay

## 2022-11-21 NOTE — Telephone Encounter (Signed)
-----   Message from Zandra Abts sent at 11/20/2022  2:21 PM EDT ----- Sodium is barely low, will continue to monitor. All other labs are stable.

## 2022-12-21 NOTE — Progress Notes (Deleted)
NEUROLOGY CONSULTATION NOTE  Sara Ramirez MRN: [id number] DOB: 09-13-1980  Referring provider: Zandra Abts, NP Primary care provider: Zandra Abts, NP  Reason for consult:  headache  Assessment/Plan:   ***   Subjective:  Sara Ramirez is a 42 year old female with HTN, mitral regurgitation, tricuspid regurgitation, anxiety and history of kidney stones who presents for headaches.  History supplemented by referring provider's note.  Onset:  childhood.  Became more frequent *** Location:  bilateral periorbital, base of skull Quality:  aching, pressure Intensity:  6/10.   Aura:  absent Prodrome:  absent Associated symptoms:  nausea, photophobia, blurred vision, rarely vomiting.  She denies associated unilateral numbness or weakness. Duration:  *** Frequency:  *** Frequency of abortive medication: *** Triggers:  *** Relieving factors:  sleep Activity:  cannot function.  Movement and routine activity aggravates.  CT head on 05/23/2022 personally reviewed was normal.  Past NSAIDS/analgesics:  indomethacin daily, ibuprofen, Fioricet, ASA Past abortive triptans:  sumatriptan tab Past abortive ergotamine:  *** Past muscle relaxants:  *** Past anti-emetic:  promethazine, Reglan Past antihypertensive medications:  propranolol, atenolol, lisinopril, diltiazem, HCTZ Past antidepressant medications:  sertraline Past anticonvulsant medications:  *** Past anti-CGRP:  *** Past vitamins/Herbal/Supplements:  B12 Past antihistamines/decongestants:  Zyrtec, benadryl Other past therapies:  ***  Current NSAIDS/analgesics:  Lodine, Excedrin Migraine Current triptans:  none Current ergotamine:  none Current anti-emetic:  Zofran ODT 4mg  Current muscle relaxants:  none Current Antihypertensive medications:  amlodipine Current Antidepressant medications:  fluoxetine 20mg  daily Current Anticonvulsant medications:  none Current anti-CGRP:  none Current  Vitamins/Herbal/Supplements:  none Current Antihistamines/Decongestants:  none Other therapy:  none Birth control:  none Other medications:  trazodone (insomnia)   Caffeine:  *** Alcohol:  *** Smoker:  *** Diet:  *** Exercise:  *** Depression:  ***; Anxiety:  *** Other pain:  *** Sleep hygiene:  *** Family history of headache:  ***      PAST MEDICAL HISTORY: Past Medical History:  Diagnosis Date   Anxiety    Hypertension    Kidney stone    Migraines    Mitral regurgitation    Renal disorder    kidney stones   Tricuspid regurgitation     PAST SURGICAL HISTORY: Past Surgical History:  Procedure Laterality Date   CHOLECYSTECTOMY N/A 06/17/2020   Procedure: LAPAROSCOPIC CHOLECYSTECTOMY;  Surgeon: Lucretia Roers, MD;  Location: [facility];  Service: General;  Laterality: N/A;   CYSTOSCOPY W/ URETERAL STENT PLACEMENT Right 01/03/2013   Procedure: CYSTOSCOPY WITH RIGHT RETROGRADE PYELOGRAM/ RIGHT URETERAL STENT PLACEMENT;  Surgeon: Martina Sinner, MD;  Location: [facility];  Service: Urology;  Laterality: Right;   LITHOTRIPSY     TYMPANOSTOMY TUBE PLACEMENT Bilateral 2002    MEDICATIONS: Current Outpatient Medications on File Prior to Visit  Medication Sig Dispense Refill   amLODipine (NORVASC) 5 MG tablet Take 1 tablet (5 mg total) by mouth daily. 90 tablet 1   FLUoxetine (PROZAC) 20 MG capsule Take 1 capsule (20 mg total) by mouth daily for 90 doses. 30 capsule 2   ondansetron (ZOFRAN-ODT) 4 MG disintegrating tablet Take 1 tablet (4 mg total) by mouth every 8 (eight) hours as needed for nausea or vomiting. 10 tablet 0   pneumococcal 20-valent conjugate vaccine (PREVNAR 20) 0.5 ML injection 0.5 mLs.     traZODone (DESYREL) 100 MG tablet Take 1 tablet (100 mg total) by mouth at bedtime as needed for up to 30 doses for sleep. 30 tablet 0  No current facility-administered medications on file prior to visit.    ALLERGIES: Allergies  Allergen Reactions   Imitrex  [Sumatriptan] Nausea Only   Toradol [Ketorolac Tromethamine] Nausea And Vomiting    FAMILY HISTORY: Family History  Problem Relation Age of Onset   Diabetes Father    Diabetes Maternal Grandfather    Heart disease Paternal Grandmother    Stroke Paternal Grandmother    Migraines Son     Objective:  *** General: No acute distress.  Patient appears well-groomed.   Head:  Normocephalic/atraumatic Eyes:  fundi examined but not visualized Neck: supple, no paraspinal tenderness, full range of motion Back: No paraspinal tenderness Heart: regular rate and rhythm Lungs: Clear to auscultation bilaterally. Vascular: No carotid bruits. Neurological Exam: Mental status: alert and oriented to person, place, and time, speech fluent and not dysarthric, language intact. Cranial nerves: CN I: not tested CN II: pupils equal, round and reactive to light, visual fields intact CN III, IV, VI:  full range of motion, no nystagmus, no ptosis CN V: facial sensation intact. CN VII: upper and lower face symmetric CN VIII: hearing intact CN IX, X: gag intact, uvula midline CN XI: sternocleidomastoid and trapezius muscles intact CN XII: tongue midline Bulk & Tone: normal, no fasciculations. Motor:  muscle strength 5/5 throughout Sensation:  Pinprick, temperature and vibratory sensation intact. Deep Tendon Reflexes:  2+ throughout,  toes downgoing.   Finger to nose testing:  Without dysmetria.   Heel to shin:  Without dysmetria.   Gait:  Normal station and stride.  Romberg negative.    Thank you for allowing me to take part in the care of this patient.  Shon Millet, DO  CC: ***

## 2022-12-22 ENCOUNTER — Ambulatory Visit: Admitting: Neurology

## 2023-01-09 ENCOUNTER — Other Ambulatory Visit: Payer: Self-pay | Admitting: Family Medicine

## 2023-01-09 ENCOUNTER — Encounter: Payer: Self-pay | Admitting: Family Medicine

## 2023-01-09 DIAGNOSIS — G479 Sleep disorder, unspecified: Secondary | ICD-10-CM

## 2023-01-09 MED ORDER — TIRZEPATIDE-WEIGHT MANAGEMENT 12.5 MG/0.5ML ~~LOC~~ SOAJ: 12.5000 mg | SUBCUTANEOUS | 2 refills | Status: DC

## 2023-01-20 ENCOUNTER — Other Ambulatory Visit (HOSPITAL_COMMUNITY): Payer: Self-pay

## 2023-02-22 ENCOUNTER — Ambulatory Visit: Admitting: Family Medicine

## 2023-03-09 ENCOUNTER — Ambulatory Visit: Admitting: Family Medicine

## 2023-03-14 ENCOUNTER — Ambulatory Visit: Admitting: Family Medicine

## 2023-04-07 ENCOUNTER — Encounter: Payer: Self-pay | Admitting: Family Medicine

## 2023-04-10 ENCOUNTER — Other Ambulatory Visit: Payer: Self-pay | Admitting: Family

## 2023-04-10 MED ORDER — TIRZEPATIDE 12.5 MG/0.5ML ~~LOC~~ SOAJ: 12.5000 mg | SUBCUTANEOUS | 1 refills | Status: DC

## 2023-04-21 ENCOUNTER — Encounter

## 2023-04-24 ENCOUNTER — Other Ambulatory Visit: Payer: Self-pay | Admitting: Family

## 2023-04-28 ENCOUNTER — Other Ambulatory Visit (HOSPITAL_COMMUNITY): Payer: Self-pay

## 2023-04-28 ENCOUNTER — Telehealth: Payer: Self-pay

## 2023-04-28 NOTE — Telephone Encounter (Signed)
Pharmacy Patient Advocate Encounter   Received notification from CoverMyMeds that prior authorization for Zepbound 12.5MG /0.5ML pen-injectors is required/requested.   Insurance verification completed.   The patient is insured through [facility] [place] .   Prior Authorization form/request asks a question that requires your assistance. Please see the question below and advise accordingly. The PA will not be submitted until the necessary information is received.

## 2023-05-02 ENCOUNTER — Other Ambulatory Visit (HOSPITAL_COMMUNITY): Payer: Self-pay

## 2023-05-07 ENCOUNTER — Encounter: Payer: Self-pay | Admitting: Family Medicine

## 2023-05-09 ENCOUNTER — Telehealth: Payer: Self-pay

## 2023-05-09 ENCOUNTER — Other Ambulatory Visit (HOSPITAL_COMMUNITY): Payer: Self-pay

## 2023-05-09 NOTE — Telephone Encounter (Signed)
*  Primary  Pharmacy Patient Advocate Encounter   Received notification from Patient Advice Request messages that prior authorization for Mounjaro  12.5MG /0.5ML auto-injectors  is required/requested.   Insurance verification completed.   The patient is insured through [facility] .   Per test claim: PA required; PA submitted to above mentioned insurance via CoverMyMeds Key/confirmation #/EOC [id number] Status is pending

## 2023-05-09 NOTE — Telephone Encounter (Signed)
PA request has been Submitted. New Encounter created for follow up. For additional info see Pharmacy Prior Auth telephone encounter from 02/04.

## 2023-05-10 NOTE — Telephone Encounter (Signed)
 Replied to patient within a different encounter informing her that the PA has been started and someone will contact her with an answer and that she could give our office a call and/or send a message via MyChart if she has further questions.

## 2023-05-12 NOTE — Telephone Encounter (Signed)
 Pharmacy Patient Advocate Encounter  Received notification from [facility] that Prior Authorization for Mounjaro  12.5MG /0.5ML auto-injectors has been DENIED.  Full denial letter will be uploaded to the media tab. See denial reason below.   PA #/Case ID/Reference #: [id number]

## 2023-05-15 ENCOUNTER — Ambulatory Visit: Payer: Self-pay | Admitting: Family Medicine

## 2023-05-15 NOTE — Telephone Encounter (Signed)
 Chief Complaint: depression Symptoms: depression, fatigue,  Frequency: 1 week Pertinent Negatives: Patient denies SI plan, HI Disposition: [] ED /[] Urgent Care (no appt availability in office) / [x] Appointment(In office/virtual)/ []  [facility]/ [] Home Care/ [] Refused Recommended Disposition /[] [facility]/ []  Follow-up with PCP Additional Notes: Patient reports she came off of her Prozac  1 month ago due to headaches and is now experiencing increase in depression symptoms x 1 week. Patient reports fatigue and feelings of sadness. Patient reports she would like to discuss getting on a different medication to help with her depression. Patient verbalizes she has been under a lot of stress this past year due to loss. Patient denies HI and SI intent/plan. Per protocol, appt scheduled 2/11. Patient advised to call back with worsening symptoms. Patient verbalized understanding.    Copied from CRM [id number]. Topic: Clinical - Red Word Triage >> May 15, 2023  9:07 AM Ilene Malling wrote: Red Word that prompted transfer to Nurse Triage: Patient [phone] quit taking Prozac  about a month ago, side effects of headaches. Patient symptoms now is crying, doesn't want to do anything, feels like needs to get on something she'll snap, and having suicidal thoughts- the world would be a better place without her. Patient wants to make an appointment to be seen to start medication. Reason for Disposition  [1] Depression AND [2] worsening (e.g., sleeping poorly, less able to do activities of daily living)  Answer Assessment - Initial Assessment Questions 1. CONCERN: "What happened that made you call today?"     I need a new anti-depressant 2. DEPRESSION SYMPTOM SCREENING: "How are you feeling overall?" (e.g., decreased energy, increased sleeping or difficulty sleeping, difficulty concentrating, feelings of sadness, guilt, hopelessness, or worthlessness)     Decreased energy, feelings of  sadness 3. RISK OF HARM - SUICIDAL IDEATION:  "Do you ever have thoughts of hurting or killing yourself?"  (e.g., yes, no, no but preoccupation with thoughts about death)   - INTENT:  "Do you have thoughts of hurting or killing yourself right NOW?" (e.g., yes, no, N/A)   - PLAN: "Do you have a specific plan for how you would do this?" (e.g., gun, knife, overdose, no plan, N/A)     No plan, just passing thoughts 4. RISK OF HARM - HOMICIDAL IDEATION:  "Do you ever have thoughts of hurting or killing someone else?"  (e.g., yes, no, no but preoccupation with thoughts about death)   - INTENT:  "Do you have thoughts of hurting or killing someone right NOW?" (e.g., yes, no, N/A)   - PLAN: "Do you have a specific plan for how you would do this?" (e.g., gun, knife, no plan, N/A)      none 5. FUNCTIONAL IMPAIRMENT: "How have things been going for you overall? Have you had more difficulty than usual doing your normal daily activities?"  (e.g., better, same, worse; self-care, school, work, interactions)     worse 6. SUPPORT: "Who is with you now?" "Who do you live with?" "Do you have family or friends who you can talk to?"      Husband, son 7. THERAPIST: "Do you have a counselor or therapist? Name?"     none 8. STRESSORS: "Has there been any new stress or recent changes in your life?"     Coming off prozac  1 month ago, dad died of cancer last year, friend has cancer 9. ALCOHOL USE OR SUBSTANCE USE (DRUG USE): "Do you drink alcohol or use any illegal drugs?"  none 10. OTHER: "Do you have any other physical symptoms right now?" (e.g., fever)       fatigue  Protocols used: Depression-A-AH

## 2023-05-16 ENCOUNTER — Encounter: Payer: Self-pay | Admitting: Family Medicine

## 2023-05-16 ENCOUNTER — Ambulatory Visit (INDEPENDENT_AMBULATORY_CARE_PROVIDER_SITE_OTHER): Admitting: Family Medicine

## 2023-05-16 VITALS — BP 140/82 | HR 76 | Temp 98.7°F | Ht 63.0 in | Wt 219.6 lb

## 2023-05-16 DIAGNOSIS — F331 Major depressive disorder, recurrent, moderate: Secondary | ICD-10-CM | POA: Diagnosis not present

## 2023-05-16 DIAGNOSIS — F411 Generalized anxiety disorder: Secondary | ICD-10-CM

## 2023-05-16 MED ORDER — ESCITALOPRAM OXALATE 10 MG PO TABS: 10.0000 mg | ORAL_TABLET | Freq: Every day | ORAL | 0 refills | Status: DC

## 2023-05-16 NOTE — Patient Instructions (Addendum)
You got this! Hang in there.  Placed a referral to psychology for counseling. Please call back to the office or send a MyChart message, if you have not received a phone call or message about an appointment. Prescribed Escitalopram (Lexapro) 10mg  for anxiety and depression. Take 1 tablet once a day at night to avoid side effects, such as nausea. If you develop suicidal or homicidal thoughts, please go directly to the closes emergency department or call someone to take you.

## 2023-05-16 NOTE — Progress Notes (Signed)
Established Patient Office Visit   Subjective:  Patient ID: Sara Ramirez, female    DOB: December 08, 1980  Age: 43 y.o. MRN: [id number]  Chief Complaint  Patient presents with   Depression    Discuss depression medication and how she is feeling     HPI Patient was taking Prozac 20mg  daily for depression and anxiety. Patient reports she thinks she has been on the medication for 2 years, initially prescribed by her previous PCP. She reports she started having daily headaches after taking the medication. She reports she started experiencing daily headaches at the end of last year. She reports she stopped taking the medication right before Christmas. She reports her headaches have improved where she is not experiencing daily headaches. However, her depression and anxiety become worse. She feels sometimes like there is no reason to live or what is the point of living if someone is going to feel like this. She does have a child that makes her not want to act out on feelings. She reports she cries most days and uncertain why she is crying. 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 She reports she thinks about loss of people like her grandparents, who raised her, her father, and friends who have suffered with cancer.   She reports she has previously been on Lorazepam, Sertraline, and Buspirone. She reports the Sertraline and Buspirone made her cry more.   Review of Systems  Psychiatric/Behavioral:  Positive for depression.    See HPI above     Objective:   BP (!) 140/82 (BP Location: Left Arm, Patient Position: Sitting)   Pulse 76   Temp 98.7 F (37.1 C) (Oral)   Ht 5\' 3"  (1.6 m)   Wt 219 lb 9.6 oz (99.6 kg)   LMP 04/30/2023   SpO2 99%   BMI 38.90 kg/m    Physical Exam Vitals reviewed.  Constitutional:      General: She is not in acute distress.    Appearance: Normal appearance. She is obese. She is not ill-appearing, toxic-appearing or  diaphoretic.  Eyes:     General:        Right eye: No discharge.        Left eye: No discharge.     Conjunctiva/sclera: Conjunctivae normal.  Cardiovascular:     Rate and Rhythm: Normal rate and regular rhythm.     Heart sounds: Normal heart sounds. No murmur heard.    No friction rub. No gallop.  Pulmonary:     Effort: Pulmonary effort is normal. No respiratory distress.     Breath sounds: Normal breath sounds.  Musculoskeletal:        General: Normal range of motion.  Skin:    General: Skin is warm and dry.  Neurological:     General: No focal deficit present.     Mental Status: She is alert and oriented to person, place, and time. Mental status is at baseline.  Psychiatric:        Attention and Perception: Attention normal.        Mood and Affect: Mood is anxious and depressed. Affect is tearful.        Speech: Speech normal.        Behavior: Behavior normal. Behavior is cooperative.        Thought Content: Thought content does not include homicidal or suicidal ideation.        Judgment: Judgment normal.      Assessment & Plan:  GAD (generalized anxiety disorder) Assessment & Plan: Scored 12 on GAD-7. Placed a referral to psychology for counseling. Prescribed Escitalopram (Lexapro) 10mg  for anxiety and depression. Take 1 tablet once a day at night to avoid side effects, such as nausea.Advised if she develops suicidal or homicidal thoughts, please go directly to the closes emergency department or call someone to take her.   Orders: -     Ambulatory referral to Psychology -     Escitalopram Oxalate; Take 1 tablet (10 mg total) by mouth daily.  Dispense: 30 tablet; Refill: 0  Moderate episode of recurrent major depressive disorder (HCC) Assessment & Plan: Scored 13 on PHQ-9. Placed a referral to psychology for counseling. Prescribed Escitalopram (Lexapro) 10mg  for anxiety and depression. Take 1 tablet once a day at night to avoid side effects, such as nausea.Advised if she  develops suicidal or homicidal thoughts, please go directly to the closes emergency department or call someone to take her.  -If this medication is not effective, discussed with patient that she would need a referral to psychiatry for medication management with being on several medications that were not effective or had side effects.   Orders: -     Ambulatory referral to Psychology -     Escitalopram Oxalate; Take 1 tablet (10 mg total) by mouth daily.  Dispense: 30 tablet; Refill: 0    Return in about 2 weeks (around 05/30/2023) for follow-up.   Zandra Abts, NP

## 2023-05-16 NOTE — Assessment & Plan Note (Signed)
Scored 12 on GAD-7. Placed a referral to psychology for counseling. Prescribed Escitalopram (Lexapro) 10mg  for anxiety and depression. Take 1 tablet once a day at night to avoid side effects, such as nausea.Advised if she develops suicidal or homicidal thoughts, please go directly to the closes emergency department or call someone to take her.

## 2023-05-16 NOTE — Assessment & Plan Note (Addendum)
Scored 13 on PHQ-9. Placed a referral to psychology for counseling. Prescribed Escitalopram (Lexapro) 10mg  for anxiety and depression. Take 1 tablet once a day at night to avoid side effects, such as nausea.Advised if she develops suicidal or homicidal thoughts, please go directly to the closes emergency department or call someone to take her.  -If this medication is not effective, discussed with patient that she would need a referral to psychiatry for medication management with being on several medications that were not effective or had side effects.

## 2023-05-17 NOTE — Progress Notes (Deleted)
 NEUROLOGY CONSULTATION NOTE  Sara Ramirez MRN: [id number] DOB: 07/22/1980  Referring provider: Alveria Apley, NP Primary care provider: Alveria Apley, NP  Reason for consult:  headache  Assessment/Plan:   ***   Subjective:  Sara Ramirez is a 43 year old ***-handed female with HTN, mitral regurgitation, depression, anxiety and history of kidney stones who presents for migraines.  History supplemented by referring provider's note.  Onset:  *** Location:  *** Quality:  *** Intensity:  ***.  *** denies new headache, thunderclap headache or severe headache that wakes *** from sleep. Aura:  *** Prodrome:  *** Postdrome:  *** Associated symptoms:  ***.  *** denies associated unilateral numbness or weakness. Duration:  *** Frequency:  *** Frequency of abortive medication: *** Triggers:  *** Relieving factors:  *** Activity:  ***  CT head on 05/23/2022 personally reviewed was normal.  Past NSAIDS/analgesics:  Fioricet, etodolac Past abortive triptans:  sumatriptan tab Past abortive ergotamine:  *** Past muscle relaxants:  *** Past anti-emetic:  Reglan, promethazine Past antihypertensive medications:  propranolol, lisinopril, HCTZ Past antidepressant medications:  sertraline, fluoxetine Past anticonvulsant medications:  *** Past anti-CGRP:  *** Past vitamins/Herbal/Supplements:  *** Past antihistamines/decongestants:  Zyrtec, Flonase, Benadryl Other past therapies:  ***  Current NSAIDS/analgesics:  *** Current triptans:  none Current ergotamine:  none Current anti-emetic:  Zofran-ODT 4mg  Current muscle relaxants:  none Current Antihypertensive medications:  amlodipine Current Antidepressant medications:  escitalopram 10mg  daily Current Anticonvulsant medications:  none Current anti-CGRP:  none Current Vitamins/Herbal/Supplements:  none Current Antihistamines/Decongestants:  none Other therapy:  *** Birth control:  none Other medications:   trazodone 100mg  at bedtime PRN, Mounjaro   Caffeine:  *** Alcohol:  *** Smoker:  *** Diet:  *** Exercise:  *** Depression:  ***; Anxiety:  *** Other pain:  *** Sleep hygiene:  *** Family history of headache:  ***      PAST MEDICAL HISTORY: Past Medical History:  Diagnosis Date   Anxiety    Hypertension    Kidney stone    Migraines    Mitral regurgitation    Renal disorder    kidney stones   Tricuspid regurgitation     PAST SURGICAL HISTORY: Past Surgical History:  Procedure Laterality Date   CHOLECYSTECTOMY N/A 06/17/2020   Procedure: LAPAROSCOPIC CHOLECYSTECTOMY;  Surgeon: Lucretia Roers, MD;  Location: [facility];  Service: General;  Laterality: N/A;   CYSTOSCOPY W/ URETERAL STENT PLACEMENT Right 01/03/2013   Procedure: CYSTOSCOPY WITH RIGHT RETROGRADE PYELOGRAM/ RIGHT URETERAL STENT PLACEMENT;  Surgeon: Martina Sinner, MD;  Location: [facility];  Service: Urology;  Laterality: Right;   LITHOTRIPSY     TYMPANOSTOMY TUBE PLACEMENT Bilateral 2002    MEDICATIONS: Current Outpatient Medications on File Prior to Visit  Medication Sig Dispense Refill   amLODipine (NORVASC) 5 MG tablet Take 1 tablet (5 mg total) by mouth daily. 90 tablet 1   escitalopram (LEXAPRO) 10 MG tablet Take 1 tablet (10 mg total) by mouth daily. 30 tablet 0   ondansetron (ZOFRAN-ODT) 4 MG disintegrating tablet Take 1 tablet (4 mg total) by mouth every 8 (eight) hours as needed for nausea or vomiting. 10 tablet 0   pneumococcal 20-valent conjugate vaccine (PREVNAR 20) 0.5 ML injection 0.5 mLs.     tirzepatide (MOUNJARO) 12.5 MG/0.5ML Pen Inject 12.5 mg into the skin once a week. 6 mL 1   traZODone (DESYREL) 100 MG tablet TAKE 1 TABLET BY MOUTH AT BEDTIME AS NEEDED FOR UP TO 30 DOSES  FOR SLEEP 30 tablet 0   No current facility-administered medications on file prior to visit.    ALLERGIES: Allergies  Allergen Reactions   Imitrex [Sumatriptan] Nausea Only   Toradol [Ketorolac Tromethamine]  Nausea And Vomiting    FAMILY HISTORY: Family History  Problem Relation Age of Onset   Diabetes Father    Diabetes Maternal Grandfather    Heart disease Paternal Grandmother    Stroke Paternal Grandmother    Migraines Son     Objective:  *** General: No acute distress.  Patient appears well-groomed.   Head:  Normocephalic/atraumatic Eyes:  fundi examined but not visualized Neck: supple, no paraspinal tenderness, full range of motion Back: No paraspinal tenderness Heart: regular rate and rhythm Lungs: Clear to auscultation bilaterally. Vascular: No carotid bruits. Neurological Exam: Mental status: alert and oriented to person, place, and time, speech fluent and not dysarthric, language intact. Cranial nerves: CN I: not tested CN II: pupils equal, round and reactive to light, visual fields intact CN III, IV, VI:  full range of motion, no nystagmus, no ptosis CN V: facial sensation intact. CN VII: upper and lower face symmetric CN VIII: hearing intact CN IX, X: gag intact, uvula midline CN XI: sternocleidomastoid and trapezius muscles intact CN XII: tongue midline Bulk & Tone: normal, no fasciculations. Motor:  muscle strength 5/5 throughout Sensation:  Pinprick, temperature and vibratory sensation intact. Deep Tendon Reflexes:  2+ throughout,  toes downgoing.   Finger to nose testing:  Without dysmetria.   Heel to shin:  Without dysmetria.   Gait:  Normal station and stride.  Romberg negative.    Thank you for allowing me to take part in the care of this patient.  Shon Millet, DO  CC: ***

## 2023-05-18 ENCOUNTER — Ambulatory Visit: Admitting: Neurology

## 2023-05-23 ENCOUNTER — Other Ambulatory Visit (HOSPITAL_COMMUNITY): Payer: Self-pay

## 2023-06-01 ENCOUNTER — Ambulatory Visit: Admitting: Family Medicine

## 2023-06-08 ENCOUNTER — Ambulatory Visit: Admitting: Psychology

## 2023-06-09 NOTE — Telephone Encounter (Signed)
 Contacted pt's pharmacy and spoke to [facility]. She states she initiates the PA with dx code.  Sending PA to dr office.   Please help with PA on Mounjaro.

## 2023-06-15 ENCOUNTER — Other Ambulatory Visit: Payer: Self-pay | Admitting: Family Medicine

## 2023-06-15 DIAGNOSIS — F411 Generalized anxiety disorder: Secondary | ICD-10-CM

## 2023-06-15 DIAGNOSIS — F331 Major depressive disorder, recurrent, moderate: Secondary | ICD-10-CM

## 2023-06-15 MED ORDER — ESCITALOPRAM OXALATE 10 MG PO TABS
10.0000 mg | ORAL_TABLET | Freq: Every day | ORAL | 0 refills | Status: DC
Start: 2023-06-15 — End: 2023-10-09

## 2023-06-15 NOTE — Telephone Encounter (Signed)
 No PA will be submitted as pt is not Type 2 diabetic, and this is a requirement for being approved for [facility]. Alternative would be Zepbound, however, this is not covered by pt's plan.

## 2023-07-03 ENCOUNTER — Encounter: Payer: Self-pay | Admitting: Family Medicine

## 2023-07-06 ENCOUNTER — Ambulatory Visit (HOSPITAL_BASED_OUTPATIENT_CLINIC_OR_DEPARTMENT_OTHER): Admitting: Student

## 2023-07-08 ENCOUNTER — Encounter: Payer: Self-pay | Admitting: Emergency Medicine

## 2023-07-08 ENCOUNTER — Ambulatory Visit
Admission: EM | Admit: 2023-07-08 | Discharge: 2023-07-08 | Disposition: A | Discharge status: Home / Self Care | Attending: Family Medicine | Admitting: Family Medicine

## 2023-07-08 ENCOUNTER — Other Ambulatory Visit: Payer: Self-pay

## 2023-07-08 DIAGNOSIS — M778 Other enthesopathies, not elsewhere classified: Secondary | ICD-10-CM

## 2023-07-08 MED ORDER — DEXAMETHASONE SODIUM PHOSPHATE 10 MG/ML IJ SOLN
10.0000 mg | Freq: Once | INTRAMUSCULAR | Status: AC
  Administered 2023-07-08: 10 mg via INTRAMUSCULAR

## 2023-07-08 MED ORDER — TIZANIDINE HCL 2 MG PO CAPS: 2.0000 mg | ORAL_CAPSULE | Freq: Three times a day (TID) | ORAL | 0 refills | Status: DC | PRN

## 2023-07-08 NOTE — Discharge Instructions (Addendum)
 We have given you a steroid shot today for pain and inflammation as well as prescribed a muscle relaxer to be taken as needed.  You may do Epsom salt soaks, Kinesiotape, massage and compression wraps

## 2023-07-08 NOTE — ED Triage Notes (Addendum)
 Pt reports right wrist pain that radiates pain to right thumb and intermittently into right upper arm since wednesday. Denies any known injury and reports "flared it up today when I was using my hands a lot at an event."

## 2023-07-08 NOTE — ED Provider Notes (Signed)
 [facility]    CSN: [id number] Arrival date & time: 07/08/23  1400      History   Chief Complaint Chief Complaint  Patient presents with   Wrist Pain    HPI Sara Ramirez is a 43 y.o. female.   Presenting today with right wrist pain symptoms radiating down into right thumb and sometimes rating up to her elbow worse with use.  Denies swelling, injury, discoloration, numbness but does have some tingling.  States she thinks she has been overdoing it at work using her hands and wrists.  So far not tried anything over-the-counter for symptoms.    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   Arthralgia 06/27/2022   Fatigue 06/27/2022   Menopausal and female climacteric states 06/27/2022   Other specified health status 06/27/2022   Reduced libido 06/27/2022   Vitamin D deficiency 06/27/2022   Nausea and vomiting 06/07/2022   Kidney stone 06/07/2022   Nephrolithiasis 06/07/2022   Migraine 06/07/2022   Hypertensive disorder 05/24/2022   Obstructive jaundice 11/28/2021   Intractable epigastric abdominal pain 11/27/2021   Sleep disturbance 09/09/2021   GAD (generalized anxiety disorder) 08/03/2021   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/03/2021   Cholelithiasis 06/16/2020   Abdominal pain 06/16/2020   Nausea & vomiting 06/16/2020   Symptomatic cholelithiasis 06/16/2020   Biliary colic 06/15/2020   B12 deficiency 07/24/2015   Uncontrolled hypertension 07/22/2015   Morbid obesity (HCC) 07/22/2015   Palpitations 07/22/2015   Other chest pain 10/02/2014   Indigestion 10/02/2014   Situational anxiety 10/02/2014   Migraine headache 11/19/2013   Primary hypertension 11/19/2013    Past Surgical  History:  Procedure Laterality Date   CHOLECYSTECTOMY N/A 06/17/2020   Procedure: LAPAROSCOPIC CHOLECYSTECTOMY;  Surgeon: Lucretia Roers, MD;  Location: [facility];  Service: General;  Laterality: N/A;   CYSTOSCOPY W/ URETERAL STENT PLACEMENT Right 01/03/2013   Procedure: CYSTOSCOPY WITH RIGHT RETROGRADE PYELOGRAM/ RIGHT URETERAL STENT PLACEMENT;  Surgeon: Martina Sinner, MD;  Location: [facility];  Service: Urology;  Laterality: Right;   LITHOTRIPSY     TYMPANOSTOMY TUBE PLACEMENT Bilateral 2002    OB History     Gravida  1   Para  0   Term  0   Preterm  0   AB  0   Living         SAB  0   IAB  0   Ectopic  0   Multiple      Live Births               Home Medications    Prior to Admission medications   Medication Sig Start Date End Date Taking? Authorizing Provider  tizanidine (ZANAFLEX) 2 MG capsule Take 1 capsule (2 mg total) by mouth 3 (three) times daily as needed for muscle spasms. Do not drink alcohol or drive while taking this medication.  May cause drowsiness. 07/08/23  Yes Particia Nearing, PA-C  amLODipine (NORVASC) 5 MG tablet Take 1 tablet (5 mg total) by mouth daily. 11/18/22   Alveria Apley, NP  escitalopram (LEXAPRO) 10 MG tablet Take 1 tablet (10 mg total) by mouth daily.  06/15/23   Alveria Apley, NP  ondansetron (ZOFRAN-ODT) 4 MG disintegrating tablet Take 1 tablet (4 mg total) by mouth every 8 (eight) hours as needed for nausea or vomiting. 11/18/22   Alveria Apley, NP  pneumococcal 20-valent conjugate vaccine (PREVNAR 20) 0.5 ML injection 0.5 mLs. 11/27/21   [provider]  tirzepatide Greggory Keen) 12.5 MG/0.5ML Pen Inject 12.5 mg into the skin once a week. 04/10/23   Worthy Rancher B, FNP  traZODone (DESYREL) 100 MG tablet TAKE 1 TABLET BY MOUTH AT BEDTIME AS NEEDED FOR UP TO 30 DOSES FOR SLEEP 01/10/23   Alveria Apley, NP    Family History Family History  Problem Relation Age of Onset   Diabetes Father     Diabetes Maternal Grandfather    Heart disease Paternal Grandmother    Stroke Paternal Grandmother    Migraines Son     Social History Social History   Tobacco Use   Smoking status: Never   Smokeless tobacco: Never  Vaping Use   Vaping status: Never Used  Substance Use Topics   Alcohol use: No    Alcohol/week: 0.0 standard drinks of alcohol   Drug use: No     Allergies   Imitrex [sumatriptan] and Toradol [ketorolac tromethamine]   Review of Systems Review of Systems PER HPI  Physical Exam Triage Vital Signs ED Triage Vitals  Encounter Vitals Group     BP 07/08/23 1409 (!) 155/103     Systolic BP Percentile --      Diastolic BP Percentile --      Pulse Rate 07/08/23 1409 81     Resp 07/08/23 1409 20     Temp 07/08/23 1409 98.3 F (36.8 C)     Temp Source 07/08/23 1409 Oral     SpO2 07/08/23 1409 98 %     Weight --      Height --      Head Circumference --      Peak Flow --      Pain Score 07/08/23 1406 8     Pain Loc --      Pain Education --      Exclude from Growth Chart --    No data found.  Updated Vital Signs BP (!) 155/103 (BP Location: Right Arm) Comment: x2, two differnet cuffs. NAD noted. pt reports "thats uaully what it is."  Pulse 81   Temp 98.3 F (36.8 C) (Oral)   Resp 20   LMP 06/16/2023 (Approximate)   SpO2 98%   Visual Acuity Right Eye Distance:   Left Eye Distance:   Bilateral Distance:    Right Eye Near:   Left Eye Near:    Bilateral Near:     Physical Exam Vitals and nursing note reviewed.  Constitutional:      Appearance: Normal appearance. She is not ill-appearing.  HENT:     Head: Atraumatic.     Mouth/Throat:     Mouth: Mucous membranes are moist.  Eyes:     Extraocular Movements: Extraocular movements intact.     Conjunctiva/sclera: Conjunctivae normal.  Cardiovascular:     Rate and Rhythm: Normal rate.  Pulmonary:     Effort: Pulmonary effort is normal.  Musculoskeletal:        General: No swelling,  tenderness, deformity or signs of injury. Normal range of motion.     Cervical back: Normal range of motion and neck supple.  Skin:    General: Skin is warm and dry.  Findings: No bruising or erythema.  Neurological:     Mental Status: She is alert and oriented to person, place, and time.     Motor: No weakness.     Gait: Gait normal.     Comments: Right upper extremity neurovascularly intact  Psychiatric:        Mood and Affect: Mood normal.        Thought Content: Thought content normal.        Judgment: Judgment normal.    UC Treatments / Results  Labs (all labs ordered are listed, but only abnormal results are displayed) Labs Reviewed - No data to display  EKG   Radiology No results found.  Procedures Procedures (including critical care time)  Medications Ordered in UC Medications  dexamethasone (DECADRON) injection 10 mg (has no administration in time range)    Initial Impression / Assessment and Plan / UC Course  I have reviewed the triage vital signs and the nursing notes.  Pertinent labs & imaging results that were available during my care of the patient were reviewed by me and considered in my medical decision making (see chart for details).     Suspect some overuse tendinitis causing her symptoms.  Treat with IM Decadron, Ace wrap, muscle relaxer, heat, massage, stretches, Kinesiotape, and other supportive measures.  Return for worsening symptoms.  Final Clinical Impressions(s) / UC Diagnoses   Final diagnoses:  Right wrist tendonitis     Discharge Instructions      We have given you a steroid shot today for pain and inflammation as well as prescribed a muscle relaxer to be taken as needed.  You may do Epsom salt soaks, Kinesiotape, massage and compression wraps    ED Prescriptions     Medication Sig Dispense Auth. Provider   tizanidine (ZANAFLEX) 2 MG capsule Take 1 capsule (2 mg total) by mouth 3 (three) times daily as needed for muscle  spasms. Do not drink alcohol or drive while taking this medication.  May cause drowsiness. 15 capsule Particia Nearing, [place]      PDMP not reviewed this encounter.   Particia Nearing, [place] 07/08/23 1512

## 2023-08-15 ENCOUNTER — Encounter: Payer: Self-pay | Admitting: Neurology

## 2023-08-15 ENCOUNTER — Ambulatory Visit (INDEPENDENT_AMBULATORY_CARE_PROVIDER_SITE_OTHER): Admitting: Neurology

## 2023-08-15 VITALS — BP 153/79 | HR 71 | Ht 63.0 in | Wt 223.0 lb

## 2023-08-15 DIAGNOSIS — G43009 Migraine without aura, not intractable, without status migrainosus: Secondary | ICD-10-CM | POA: Diagnosis not present

## 2023-08-15 MED ORDER — ONDANSETRON 4 MG PO TBDP: 4.0000 mg | ORAL_TABLET | Freq: Three times a day (TID) | ORAL | 5 refills | Status: DC | PRN

## 2023-08-15 MED ORDER — AIMOVIG 140 MG/ML ~~LOC~~ SOAJ: 140.0000 mg | SUBCUTANEOUS | 5 refills | Status: DC

## 2023-08-15 NOTE — Patient Instructions (Signed)
  Start Aimovig 140mg  injection every 28 days.  Contact us  in 3 months with update Take Nurtec at earliest onset of headache.  Maximum 1 tablet in 24 hours.  Let me know if it works. Zofran  for nausea Limit use of pain relievers to no more than 9 days out of the month.  These medications include acetaminophen , NSAIDs (ibuprofen /Advil /Motrin , naproxen/Aleve, triptans (Imitrex /sumatriptan ), Excedrin, and narcotics.  This will help reduce risk of rebound headaches. Be aware of common food triggers Routine exercise Stay adequately hydrated (aim for 64 oz water daily) Keep headache diary Maintain proper stress management Maintain proper sleep hygiene Do not skip meals Consider supplements:  MigreLief pill, coenzyme Q10 300mg  daily.

## 2023-08-15 NOTE — Progress Notes (Signed)
 NEUROLOGY CONSULTATION NOTE  Sara Ramirez MRN: [id number] DOB: Jun 06, 1980  Referring provider: Rebecca Campus, NP Primary care provider: Rebecca Campus, NP  Reason for consult:  migraines  Assessment/Plan:   Migraine without aura, without status migrainosus, not intractable  Migraine prevention:  Plan to start Aimovig 140mg  Migraine rescue:  Will try samples of Nurtec.  Zofran -ODT 4mg  for nausea Limit use of pain relievers to no more than 9 days out of the month to prevent risk of rebound or medication-overuse headache. Keep headache diary Discussed lifestyle modification Follow up 6 months.    Subjective:  Sara Ramirez is a 43 year old right-handed female with HTN and GAD who presents for migraines.  History supplemented by referring provider's note.  Onset:  Many years.  Over the last 1-2 years, increased frequency. Location:  Often starts in neck and radiates up back of head bilaterally to the front. Quality:  mostly pounding Intensity:  5-6/10 (sometimes 8-9/10). Aura:  absent Prodrome:  absent Associated symptoms:  neck pain, nausea, sometimes vomiting, photophobia, phonophobia, osmophobia, sleepy, sometimes blurred vision.  She denies associated facial droop, slurred speech, language dysfunction, unilateral numbness or weakness. Duration:  until she falls asleep Frequency:  daily for several months.  Previously only twice a month. Sometimes wakes up with headache Frequency of abortive medication: Excedrin one to two days a week Triggers:  stress Relieving factors:  sleep Activity:  aggravates  CT head on 05/23/2022 personally reviewed was unremarkable.  Past NSAIDS/analgesics:  indomethacin, Midrin, Fioricet Past abortive triptans:  sumatriptan  tab, rizatriptan (caused lock jaw) Past abortive ergotamine:  none Past muscle relaxants:  none Past anti-emetic:  Zofran -ODT 4mg  Past antihypertensive medications:  lisinopril , HCTZ Past antidepressant  medications:  sertraline  Past anticonvulsant medications:  topiramate Past anti-CGRP:  none Past vitamins/Herbal/Supplements:  magnesiium Past antihistamines/decongestants:  none Other past therapies:  none  Current NSAIDS/analgesics:  Excedrin Migraine Current triptans:  none Current ergotamine:  none Current anti-emetic:  none Current muscle relaxants:  none Current Antihypertensive medications:  amlodipine  5mg  daily Current Antidepressant medications:  escitalopram  10mg  daily Current Anticonvulsant medications:  none Current anti-CGRP:  none Current Vitamins/Herbal/Supplements:  none Current Antihistamines/Decongestants:  none Other therapy:  none Birth control:  none   Caffeine :  2-3 cans Mt Dew daily.  No coffee Diet:  16 oz water daily.  2-3 cans Mt Dew daily.  No specific diet. Exercise:  no Depression:  yes; Anxiety:  yes Sleep hygiene:  poor.  Takes time to fall asleep and stay asleep.  6 hours interrupted sleep.  No personal history of head trauma or concussion. Family history of headache:  mom (migraines).  No history of aneurysm.      PAST MEDICAL HISTORY: Past Medical History:  Diagnosis Date   Anxiety    Hypertension    Kidney stone    Migraines    Mitral regurgitation    Renal disorder    kidney stones   Tricuspid regurgitation     PAST SURGICAL HISTORY: Past Surgical History:  Procedure Laterality Date   CHOLECYSTECTOMY N/A 06/17/2020   Procedure: LAPAROSCOPIC CHOLECYSTECTOMY;  Surgeon: Awilda Bogus, MD;  Location: [facility];  Service: General;  Laterality: N/A;   CYSTOSCOPY W/ URETERAL STENT PLACEMENT Right 01/03/2013   Procedure: CYSTOSCOPY WITH RIGHT RETROGRADE PYELOGRAM/ RIGHT URETERAL STENT PLACEMENT;  Surgeon: Devorah Fonder, MD;  Location: [facility];  Service: Urology;  Laterality: Right;   LITHOTRIPSY     TYMPANOSTOMY TUBE PLACEMENT Bilateral 2002    MEDICATIONS: Current  Outpatient Medications on File Prior to Visit  Medication  Sig Dispense Refill   amLODipine  (NORVASC ) 5 MG tablet Take 1 tablet (5 mg total) by mouth daily. 90 tablet 1   escitalopram  (LEXAPRO ) 10 MG tablet Take 1 tablet (10 mg total) by mouth daily. 30 tablet 0   ondansetron  (ZOFRAN -ODT) 4 MG disintegrating tablet Take 1 tablet (4 mg total) by mouth every 8 (eight) hours as needed for nausea or vomiting. 10 tablet 0   tirzepatide  (MOUNJARO ) 12.5 MG/0.5ML Pen Inject 12.5 mg into the skin once a week. (Patient not taking: Reported on 08/15/2023) 6 mL 1   No current facility-administered medications on file prior to visit.    ALLERGIES: Allergies  Allergen Reactions   Imitrex  [Sumatriptan ] Nausea Only   Toradol  [Ketorolac  Tromethamine ] Nausea And Vomiting    FAMILY HISTORY: Family History  Problem Relation Age of Onset   Migraines Mother    Diabetes Father    Diabetes Maternal Grandfather    Heart disease Paternal Grandmother    Stroke Paternal Grandfather    Migraines Son     Objective:  Blood pressure (!) 153/79, pulse 71, height 5\' 3"  (1.6 m), weight 223 lb (101.2 kg), SpO2 100%. General: No acute distress.  Patient appears well-groomed.   Head:  Normocephalic/atraumatic Eyes:  fundi examined but not visualized Neck: supple, bilateral paraspinal tenderness, full range of motion Heart: regular rate and rhythm Neurological Exam: Mental status: alert and oriented to person, place, and time, speech fluent and not dysarthric, language intact. Cranial nerves: CN I: not tested CN II: pupils equal, round and reactive to light, visual fields intact CN III, IV, VI:  full range of motion, no nystagmus, no ptosis CN V: facial sensation intact. CN VII: upper and lower face symmetric CN VIII: hearing intact CN IX, X: gag intact, uvula midline CN XI: sternocleidomastoid and trapezius muscles intact CN XII: tongue midline Bulk & Tone: normal, no fasciculations. Motor:  muscle strength 5/5 throughout Sensation:  Pinprick, temperature and  vibratory sensation intact. Deep Tendon Reflexes:  2+ throughout,  toes downgoing.   Finger to nose testing:  Without dysmetria.   Heel to shin:  Without dysmetria.   Gait:  Normal station and stride.  Romberg negative.    Thank you for allowing me to take part in the care of this patient.  Janne Members, DO  CC: Rebecca Campus, NP

## 2023-08-15 NOTE — Progress Notes (Deleted)
z

## 2023-09-10 ENCOUNTER — Encounter: Payer: Self-pay | Admitting: Neurology

## 2023-09-12 ENCOUNTER — Other Ambulatory Visit: Payer: Self-pay | Admitting: Neurology

## 2023-09-12 ENCOUNTER — Telehealth: Payer: Self-pay | Admitting: Pharmacy Technician

## 2023-09-12 ENCOUNTER — Other Ambulatory Visit (HOSPITAL_COMMUNITY): Payer: Self-pay

## 2023-09-12 ENCOUNTER — Telehealth: Payer: Self-pay

## 2023-09-12 MED ORDER — NURTEC 75 MG PO TBDP: 1.0000 | ORAL_TABLET | Freq: Every day | ORAL | 5 refills | Status: DC | PRN

## 2023-09-12 NOTE — Telephone Encounter (Signed)
 PA has been submitted, and telephone encounter has been created. Please see telephone encounter dated 6.10.25.

## 2023-09-12 NOTE — Telephone Encounter (Signed)
 Pharmacy Patient Advocate Encounter   Received notification from Pt Calls Messages that prior authorization for NURTEC 75MG  is required/requested.   Insurance verification completed.   The patient is insured through [employer] [place] .   Per test claim: PA required; PA submitted to above mentioned insurance via CoverMyMeds Key/confirmation #/EOC [facility] Status is pending

## 2023-09-12 NOTE — Telephone Encounter (Signed)
 Pt needs a PA for her nurtec Dr Festus Hubert just started her on it

## 2023-09-15 NOTE — Telephone Encounter (Signed)
 Pharmacy Patient Advocate Encounter  Received notification from [facility] Medicaid that Prior Authorization for  NURTEC 75MG  Tablet Disintegrating has been APPROVED from 08-29-2023 to 09-11-2024   PA #/Case ID/Reference #: [facility]

## 2023-09-20 ENCOUNTER — Ambulatory Visit: Admitting: Neurology

## 2023-10-03 ENCOUNTER — Other Ambulatory Visit: Payer: Self-pay | Admitting: Family Medicine

## 2023-10-03 DIAGNOSIS — I10 Essential (primary) hypertension: Secondary | ICD-10-CM

## 2023-10-04 ENCOUNTER — Other Ambulatory Visit (HOSPITAL_COMMUNITY): Payer: Self-pay

## 2023-10-05 ENCOUNTER — Other Ambulatory Visit: Payer: Self-pay | Admitting: Family Medicine

## 2023-10-05 DIAGNOSIS — F411 Generalized anxiety disorder: Secondary | ICD-10-CM

## 2023-10-05 DIAGNOSIS — F331 Major depressive disorder, recurrent, moderate: Secondary | ICD-10-CM

## 2024-01-04 ENCOUNTER — Other Ambulatory Visit: Payer: Self-pay

## 2024-01-04 MED ORDER — NURTEC 75 MG PO TBDP: 1.0000 | ORAL_TABLET | Freq: Every day | ORAL | 2 refills | Status: DC | PRN

## 2024-01-04 NOTE — Telephone Encounter (Signed)
 Refill request received from [facility] for Nurtec.

## 2024-01-25 ENCOUNTER — Other Ambulatory Visit: Payer: Self-pay | Admitting: Neurology

## 2024-02-05 ENCOUNTER — Encounter: Payer: Self-pay | Admitting: Radiology

## 2024-02-15 ENCOUNTER — Ambulatory Visit (INDEPENDENT_AMBULATORY_CARE_PROVIDER_SITE_OTHER)

## 2024-02-15 ENCOUNTER — Ambulatory Visit (INDEPENDENT_AMBULATORY_CARE_PROVIDER_SITE_OTHER): Admitting: Family Medicine

## 2024-02-15 ENCOUNTER — Encounter: Payer: Self-pay | Admitting: Family Medicine

## 2024-02-15 VITALS — BP 134/86 | HR 75 | Temp 98.2°F | Ht 63.0 in | Wt 232.0 lb

## 2024-02-15 DIAGNOSIS — R5383 Other fatigue: Secondary | ICD-10-CM

## 2024-02-15 DIAGNOSIS — M79671 Pain in right foot: Secondary | ICD-10-CM

## 2024-02-15 DIAGNOSIS — E559 Vitamin D deficiency, unspecified: Secondary | ICD-10-CM

## 2024-02-15 DIAGNOSIS — Z6841 Body Mass Index (BMI) 40.0 and over, adult: Secondary | ICD-10-CM

## 2024-02-15 DIAGNOSIS — F411 Generalized anxiety disorder: Secondary | ICD-10-CM

## 2024-02-15 DIAGNOSIS — K625 Hemorrhage of anus and rectum: Secondary | ICD-10-CM

## 2024-02-15 DIAGNOSIS — I10 Essential (primary) hypertension: Secondary | ICD-10-CM

## 2024-02-15 DIAGNOSIS — E538 Deficiency of other specified B group vitamins: Secondary | ICD-10-CM | POA: Diagnosis not present

## 2024-02-15 DIAGNOSIS — F331 Major depressive disorder, recurrent, moderate: Secondary | ICD-10-CM

## 2024-02-15 DIAGNOSIS — N63 Unspecified lump in unspecified breast: Secondary | ICD-10-CM

## 2024-02-15 MED ORDER — PREDNISONE 10 MG PO TABS: ORAL_TABLET | ORAL | 0 refills | Status: AC

## 2024-02-15 NOTE — Progress Notes (Unsigned)
   Established Patient Office Visit   Subjective:  Patient ID: Sara Ramirez, female    DOB: Apr 06, 1980  Age: 43 y.o. MRN: [id number]  Chief Complaint  Patient presents with  . Medical Management of Chronic Issues    HPI Patient is complaining of a knot under her left axilla. Noticed it months ago. Same size, but painful. Pain on palpitation and with certain movements. No discharge, no discoloration of skin.   Patient is complaining of severe constipation with abdomen pain. Severe constipation since last August. In order to have a bowel movement, she has to take laxatives, MOM, and stool softeners. She reports she typically will go twice a week and it rock hard. Noticed she blood tinged in her stool a couple days ago. Denies dark, tarry stools. Denies having a colonoscopy or GI specialist.   Patient is complaining of right foot pain that stared 2 months ago. Pain is constant. Described as sharp and throbbing. Radiates to her knee. Denies recent injury. Biofreeze helps the pain.    ROS See HPI above     Objective:   BP 134/86   Pulse 75   Temp 98.2 F (36.8 C) (Oral)   Ht 5' 3 (1.6 m)   Wt 232 lb (105.2 kg)   SpO2 99%   BMI 41.10 kg/m  {Vitals History (Optional):23777}  Physical Exam  No results found for any visits on 02/15/24.  The 10-year ASCVD risk score (Arnett DK, et al., 2019) is: 0.7%    Assessment & Plan:  There are no diagnoses linked to this encounter.  No follow-ups on file.   Arvella Massingale, NP

## 2024-02-16 LAB — COMPREHENSIVE METABOLIC PANEL WITH GFR
ALT: 19 U/L (ref 0–35)
AST: 17 U/L (ref 0–37)
Albumin: 4.2 g/dL (ref 3.5–5.2)
Alkaline Phosphatase: 80 U/L (ref 39–117)
BUN: 16 mg/dL (ref 6–23)
CO2: 27 meq/L (ref 19–32)
Calcium: 9.2 mg/dL (ref 8.4–10.5)
Chloride: 104 meq/L (ref 96–112)
Creatinine, Ser: 0.89 mg/dL (ref 0.40–1.20)
GFR: 80.76 mL/min (ref >60.00)
Glucose, Bld: 73 mg/dL (ref 70–99)
Potassium: 3.9 meq/L (ref 3.5–5.1)
Sodium: 141 meq/L (ref 135–145)
Total Bilirubin: 0.4 mg/dL (ref 0.2–1.2)
Total Protein: 7.3 g/dL (ref 6.0–8.3)

## 2024-02-16 LAB — CBC WITH DIFFERENTIAL/PLATELET
Basophils Absolute: 0.1 K/uL (ref 0.0–0.1)
Basophils Relative: 1.4 % (ref 0.0–3.0)
Eosinophils Absolute: 0.2 K/uL (ref 0.0–0.7)
Eosinophils Relative: 2.2 % (ref 0.0–5.0)
HCT: 39 % (ref 36.0–46.0)
Hemoglobin: 13.5 g/dL (ref 12.0–15.0)
Lymphocytes Relative: 26.8 % (ref 12.0–46.0)
Lymphs Abs: 2 K/uL (ref 0.7–4.0)
MCHC: 34.7 g/dL (ref 30.0–36.0)
MCV: 94.5 fl (ref 78.0–100.0)
Monocytes Absolute: 0.6 K/uL (ref 0.1–1.0)
Monocytes Relative: 8.1 % (ref 3.0–12.0)
Neutro Abs: 4.7 K/uL (ref 1.4–7.7)
Neutrophils Relative %: 61.5 % (ref 43.0–77.0)
Platelets: 353 K/uL (ref 150.0–400.0)
RBC: 4.13 Mil/uL (ref 3.87–5.11)
RDW: 13.7 % (ref 11.5–15.5)
WBC: 7.6 K/uL (ref 4.0–10.5)

## 2024-02-16 LAB — IRON,TIBC AND FERRITIN PANEL
%SAT: 11 % — ABNORMAL LOW (ref 16–45)
Ferritin: 11 ng/mL — ABNORMAL LOW (ref 16–232)
Iron: 40 ug/dL (ref 40–190)
TIBC: 367 ug/dL (ref 250–450)

## 2024-02-16 LAB — LIPID PANEL
Cholesterol: 183 mg/dL (ref 0–200)
HDL: 64.2 mg/dL (ref >39.00)
LDL Cholesterol: 104 mg/dL — ABNORMAL HIGH (ref 0–99)
NonHDL: 118.5
Total CHOL/HDL Ratio: 3
Triglycerides: 74 mg/dL (ref 0.0–149.0)
VLDL: 14.8 mg/dL (ref 0.0–40.0)

## 2024-02-16 LAB — VITAMIN B12: Vitamin B-12: 158 pg/mL — ABNORMAL LOW (ref 211–911)

## 2024-02-16 LAB — VITAMIN D 25 HYDROXY (VIT D DEFICIENCY, FRACTURES): VITD: 18.49 ng/mL — ABNORMAL LOW (ref 30.00–100.00)

## 2024-02-16 LAB — TSH: TSH: 2.15 u[IU]/mL (ref 0.35–5.50)

## 2024-02-16 MED ORDER — ESCITALOPRAM OXALATE 10 MG PO TABS: 10.0000 mg | ORAL_TABLET | Freq: Every day | ORAL | 3 refills | Status: AC

## 2024-02-16 MED ORDER — AMLODIPINE BESYLATE 5 MG PO TABS: 5.0000 mg | ORAL_TABLET | Freq: Every day | ORAL | 3 refills | Status: DC

## 2024-02-16 NOTE — Assessment & Plan Note (Addendum)
 Stable. Patient scored 4 on PHQ-9 and 5 on GAD-7. Continue Escitalopram  10mg  daily. Refilled medication.

## 2024-02-16 NOTE — Assessment & Plan Note (Signed)
-  Ordered vitamin B12. Not taking supplement.

## 2024-02-16 NOTE — Assessment & Plan Note (Addendum)
 Blood pressure is stable today. Continue Amlodipine  5 mg daily. Ordered CMP to assess kidney function. Refilled medication.

## 2024-02-16 NOTE — Patient Instructions (Addendum)
-  It was good to see you! -Ordered diagnostic mammogram for both breast and ultrasound of both breast, including axilla for palpable mass.  -Ordered labs due to complaints of rectal bleeding. On rectal exam, positive for blood. Placed a referral to gastroenterology.  -Recommend to follow up sooner or be seen in the emergency department if stools become dark, tarry or notice blood clots in stool.  -Suspect right foot pain is related to planter fascitis. Provided general information about plantar fascitis.  -Prescribed Prednisone 10mg  tablet, 6 day taper for pain. Rest foot as much as possible.  -Ordered right foot x-ray due to pain.  -Continue all medications. -Ordered labs. Office will call with lab results and will be available via MyChart.  -Follow up in 6 months for a physical.

## 2024-02-16 NOTE — Assessment & Plan Note (Signed)
 Stable. Patient scored 4 on PHQ-9 and 5 on GAD-7. Continue Escitalopram  10mg  daily. Refilled medication.

## 2024-02-16 NOTE — Assessment & Plan Note (Signed)
 Ordered labs: CBC, CMP, Iron panel, TSH, and Vitamin B12/D. If labs are stable without a reason for fatigue, will suggest a sleep study for sleep apnea.

## 2024-02-16 NOTE — Assessment & Plan Note (Signed)
 Ordered lab. Not taking supplement.

## 2024-02-19 LAB — HEMOGLOBIN A1C: Hgb A1c MFr Bld: 4.9 % (ref 4.6–6.5)

## 2024-02-20 ENCOUNTER — Other Ambulatory Visit: Payer: Self-pay

## 2024-02-20 ENCOUNTER — Ambulatory Visit: Payer: Self-pay | Admitting: Family Medicine

## 2024-02-20 DIAGNOSIS — E559 Vitamin D deficiency, unspecified: Secondary | ICD-10-CM

## 2024-02-20 DIAGNOSIS — E538 Deficiency of other specified B group vitamins: Secondary | ICD-10-CM

## 2024-02-20 DIAGNOSIS — M1990 Unspecified osteoarthritis, unspecified site: Secondary | ICD-10-CM

## 2024-02-20 DIAGNOSIS — M7582 Other shoulder lesions, left shoulder: Secondary | ICD-10-CM

## 2024-02-20 DIAGNOSIS — D649 Anemia, unspecified: Secondary | ICD-10-CM

## 2024-02-20 MED ORDER — IRON (FERROUS SULFATE) 325 (65 FE) MG PO TABS: 325.0000 mg | ORAL_TABLET | Freq: Every day | ORAL | 0 refills | Status: AC

## 2024-02-20 MED ORDER — CYANOCOBALAMIN 1000 MCG/ML IJ SOLN: 1000.0000 ug | Freq: Once | INTRAMUSCULAR | 5 refills | Status: DC

## 2024-02-20 MED ORDER — VITAMIN D3 1.25 MG (50000 UT) PO CAPS: 1.2500 mg | ORAL_CAPSULE | ORAL | 0 refills | Status: AC

## 2024-02-20 MED ORDER — NEEDLES & SYRINGES MISC: 1.0000 | 0 refills | Status: AC

## 2024-02-20 MED ORDER — CYANOCOBALAMIN 1000 MCG/ML IJ SOLN: INTRAMUSCULAR | 5 refills | Status: AC

## 2024-02-20 MED ORDER — CYANOCOBALAMIN 1000 MCG/ML IJ SOLN: 1000.0000 ug | INTRAMUSCULAR | 5 refills | Status: DC

## 2024-02-20 NOTE — Addendum Note (Signed)
 Addended by: Evelina Lore R on: 02/20/2024 04:31 PM   Modules accepted: Orders

## 2024-02-20 NOTE — Addendum Note (Signed)
 Addended by: ELNER NANNY B on: 02/20/2024 10:30 AM   Modules accepted: Orders

## 2024-02-22 ENCOUNTER — Ambulatory Visit (INDEPENDENT_AMBULATORY_CARE_PROVIDER_SITE_OTHER)

## 2024-02-22 ENCOUNTER — Ambulatory Visit (INDEPENDENT_AMBULATORY_CARE_PROVIDER_SITE_OTHER): Admitting: Podiatry

## 2024-02-22 DIAGNOSIS — M62461 Contracture of muscle, right lower leg: Secondary | ICD-10-CM

## 2024-02-22 DIAGNOSIS — M722 Plantar fascial fibromatosis: Secondary | ICD-10-CM | POA: Diagnosis not present

## 2024-02-22 MED ORDER — TRIAMCINOLONE ACETONIDE 10 MG/ML IJ SUSP
10.0000 mg | Freq: Once | INTRAMUSCULAR | Status: AC
  Administered 2024-02-22: 10 mg

## 2024-02-22 MED ORDER — MELOXICAM 15 MG PO TABS: 15.0000 mg | ORAL_TABLET | Freq: Every day | ORAL | 0 refills | Status: DC

## 2024-02-22 NOTE — Progress Notes (Signed)
 Subjective:  Patient ID: Sara Ramirez, female    DOB: November 24, 1980,  MRN: [id number]  Chief Complaint  Patient presents with   Foot Pain    Pain ans aching in R heel and into arch x 3 months. Not diabetic no anti coag    Discussed the use of AI scribe software for clinical note transcription with the patient, who gave verbal consent to proceed.  History of Present Illness Sara Ramirez is a 43 year old female who presents with right heel pain.  She has experienced right heel pain for three months, exacerbated by prolonged periods of ambulation. The pain originates in the heel and arch, sometimes radiating upwards, and is most severe upon waking and after periods of rest, causing her to hobble. She describes the pain as throbbing and aching at night.  There is tenderness on the plantar medial aspect of the right heel, with more pain on the side than the center. Slight pain is present in the arch and when the heel is squeezed, but no shooting sensations towards the toes. No similar symptoms are present on the left side.  She has lost nearly 100 pounds with Mounjaro , which she associates with the onset of her symptoms. She denies increased physical activity during this period. She has tried cushioned bedroom slippers without relief.  She can take ibuprofen  but has adverse reactions to Toradol .      Objective:    Physical Exam EXTREMITIES: Tenderness on the plantar medial aspect of the right heel. Good muscle strength with dorsiflexion, plantar flexion, inversion, and eversion. Decreased ankle joint dorsiflexion and range of motion on the right. Equinus noted bilaterally, right greater than left, with high arches. DP and PT pulses palpable 2/4 bilaterally. No diffuse edema. Negative Tinel's sign bilaterally. Pedal skin well-hydrated within normal limits for skin texture and skin turgor, no open wounds noted. NEUROLOGICAL: Protective sensation intact.   No images are  attached to the encounter.    Results RADIOLOGY Right foot weightbearing 3 views x-ray 02/22/2024: Normal osseous mineralization.  High arch foot type noted.  Thickening of plantar fascia noted.  Plantar and posterior calcaneal enthesophyte formation noted.  Some dorsal spur formation of first metatarsal.  Wide forefoot.     Assessment:   1. Plantar fasciitis, right   2. Gastrocnemius equinus, right      Plan:  Patient was evaluated and treated and all questions answered.  Assessment and Plan Assessment & Plan Right plantar fasciitis Chronic right plantar fasciitis with pain due to biomechanical stress from high arches and equinus deformity. Heel spur present but not primary pain source. - Administered steroid injection to the right plantar fascia. - Prescribed meloxicam  15 mg daily for two weeks, then as needed. - Instructed on stretching regimen focusing on the Achilles tendon, to be performed two to three times daily. - Recommended supportive footwear with good arch support, such as Burnetta, Hoka's, Asics, or New Balance. - Provided over-the-counter Powerstep inserts with rigid arch support and deep heel cup. - Recommended a brace to offload the plantar fascia. - Advised against going barefoot on hard floors; recommended wearing supportive footwear at home. - Scheduled follow-up appointment in two to three weeks.  Right greater than left equinus deformity Equinus deformity more pronounced on the right side, contributing to biomechanical stress on the plantar fascia. - Included equinus stretching exercises in the stretching regimen.  High arch foot type (pes cavus) High arch foot type contributes to biomechanical stress on the plantar fascia. -  Recommended supportive footwear with good arch support. - Provided over-the-counter inserts with rigid arch support.  Procedure: Injection Tendon/Ligament Discussed alternatives, risks, complications and verbal consent was obtained.   Location: Right plantar fascia. Skin Prep: Alcohol. Injectate: 0.5 cc 0.5% marcaine  plain, 0.5 cc of 2% lidocaine  plain and 1 cc of Kenalog  Disposition: Patient tolerated procedure well. Injection site dressed with a band-aid.  Post-injection care was discussed and return precautions discussed.        Return in about 2 weeks (around 03/07/2024) for Plantar Fasciitis.

## 2024-02-22 NOTE — Patient Instructions (Signed)

## 2024-02-25 ENCOUNTER — Encounter: Payer: Self-pay | Admitting: Podiatry

## 2024-02-28 ENCOUNTER — Encounter: Payer: Self-pay | Admitting: Family Medicine

## 2024-03-03 NOTE — Progress Notes (Unsigned)
 NEUROLOGY FOLLOW UP OFFICE NOTE  Sara Ramirez [id number]  Assessment/Plan:   Migraine without aura, without status migrainosus, not intractable Hypertension - did not take her medication this morning   Migraine prevention:  Aimovig  140mg  Migraine rescue:  Nurtec.  Zofran -ODT 4mg  for nausea Limit use of pain relievers to no more than 9 days out of the month to prevent risk of rebound or medication-overuse headache. Keep headache diary Discussed lifestyle modification Take antihypertensive medication.  Follow up with PCP if still elevated Follow up 6 months.    Subjective:  Sara Ramirez is a 43 year old right-handed female with HTN and GAD who follows up for migraines.  UPDATE: Started Aimovig  Notes improvement Intensity:  2-3/10 Duration:   30 minutes with Nurtec Frequency:  5-7/10 She thinks her current headaches are related to prolonged computer screen time at work.  She has glasses but hasn't had an eye exam in over a year. Current NSAIDS/analgesics:  none Current triptans:  none Current ergotamine:  none Current anti-emetic:  Zofran  4mg  Current muscle relaxants:  none Current Antihypertensive medications:  amlodipine  5mg  daily Current Antidepressant medications:  escitalopram  10mg  daily Current Anticonvulsant medications:  none Current anti-CGRP:  Aimovig  140mg , Nurtec PRN Current Vitamins/Herbal/Supplements:  none Current Antihistamines/Decongestants:  none Other therapy:  none Birth control:  none   Caffeine :  1-2 cans Mt Dew daily.  No coffee Diet:  140oz water daily.  No specific diet.  Tried to stay away from chocolate.  Still has Mt Dew Exercise:  no Depression:  yes; Anxiety:  yes Sleep hygiene:  poor.  Takes time to fall asleep and stay asleep.  6 hours interrupted sleep.  She thinks it is stress-related.    HISTORY: Onset:  Many years.  Over the last 1-2 years, increased frequency. Location:  Often starts in neck and radiates up back of head  bilaterally to the front. Quality:  mostly pounding Intensity:  5-6/10 (sometimes 8-9/10). Aura:  absent Prodrome:  absent Associated symptoms:  neck pain, nausea, sometimes vomiting, photophobia, phonophobia, osmophobia, sleepy, sometimes blurred vision.  She denies associated facial droop, slurred speech, language dysfunction, unilateral numbness or weakness. Duration:  until she falls asleep Frequency:  daily for several months.  Previously only twice a month. Sometimes wakes up with headache Frequency of abortive medication: Excedrin one to two days a week Triggers:  stress Relieving factors:  sleep Activity:  aggravates  CT head on 05/23/2022 personally reviewed was unremarkable.  Past NSAIDS/analgesics:  indomethacin, Midrin, Fioricet Past abortive triptans:  sumatriptan  tab, rizatriptan (caused lock jaw) Past abortive ergotamine:  none Past muscle relaxants:  none Past anti-emetic:  Zofran -ODT 4mg  Past antihypertensive medications:  lisinopril , HCTZ Past antidepressant medications:  sertraline  Past anticonvulsant medications:  topiramate Past anti-CGRP:  none Past vitamins/Herbal/Supplements:  magnesiium Past antihistamines/decongestants:  none Other past therapies:  none    No personal history of head trauma or concussion. Family history of headache:  mom (migraines).  No history of aneurysm.  PAST MEDICAL HISTORY: Past Medical History:  Diagnosis Date   Anxiety    Hypertension    Kidney stone    Migraines    Mitral regurgitation    Renal disorder    kidney stones   Tricuspid regurgitation     MEDICATIONS: Current Outpatient Medications on File Prior to Visit  Medication Sig Dispense Refill   amLODipine  (NORVASC ) 5 MG tablet Take 1 tablet (5 mg total) by mouth daily. 90 tablet 3   Cholecalciferol (VITAMIN D3) 1.25 MG (  50000 UT) CAPS Take 1 capsule (1.25 mg total) by mouth every 7 (seven) days. 12 capsule 0   cyanocobalamin  (VITAMIN B12) 1000 MCG/ML injection  Inject 1mL ( )  every 7  days for four weeks then inject 1mL (1000mcg) every 30 days for 2 months. 1 mL 5   Erenumab -aooe (AIMOVIG ) 140 MG/ML SOAJ INJECT 140 MG INTO THE SKIN EVERY 28 DAYS 1 mL 1   escitalopram  (LEXAPRO ) 10 MG tablet Take 1 tablet (10 mg total) by mouth daily. 90 tablet 3   Iron , Ferrous Sulfate , 325 (65 Fe) MG TABS Take 325 mg by mouth daily. 90 tablet 0   meloxicam  (MOBIC ) 15 MG tablet Take 1 tablet (15 mg total) by mouth daily. 30 tablet 0   Needles & Syringes MISC 1 each by Does not apply route once a week. 6 each 0   ondansetron  (ZOFRAN -ODT) 4 MG disintegrating tablet Take 1 tablet (4 mg total) by mouth every 8 (eight) hours as needed. 20 tablet 5   Rimegepant Sulfate (NURTEC) 75 MG TBDP Take 1 tablet (75 mg total) by mouth daily as needed. 16 tablet 2   No current facility-administered medications on file prior to visit.    ALLERGIES: Allergies  Allergen Reactions   Imitrex  [Sumatriptan ] Nausea Only   Toradol  [Ketorolac  Tromethamine ] Nausea And Vomiting    FAMILY HISTORY: Family History  Problem Relation Age of Onset   Migraines Mother    Diabetes Father    Diabetes Maternal Grandfather    Heart disease Paternal Grandmother    Stroke Paternal Grandfather    Migraines Son       Objective:  Blood pressure (!) 147/100, pulse 69, height 5' 3 (1.6 m), weight 228 lb (103.4 kg), SpO2 98%. General: No acute distress.  Patient appears well-groomed.     Juliene Dunnings, DO  CC: [name], NP

## 2024-03-04 ENCOUNTER — Ambulatory Visit (INDEPENDENT_AMBULATORY_CARE_PROVIDER_SITE_OTHER): Admitting: Neurology

## 2024-03-04 ENCOUNTER — Encounter: Payer: Self-pay | Admitting: Neurology

## 2024-03-04 VITALS — BP 147/100 | HR 69 | Ht 63.0 in | Wt 228.0 lb

## 2024-03-04 DIAGNOSIS — G43009 Migraine without aura, not intractable, without status migrainosus: Secondary | ICD-10-CM | POA: Diagnosis not present

## 2024-03-04 MED ORDER — ONDANSETRON 4 MG PO TBDP: 4.0000 mg | ORAL_TABLET | Freq: Three times a day (TID) | ORAL | 5 refills | Status: AC | PRN

## 2024-03-04 MED ORDER — AIMOVIG 140 MG/ML ~~LOC~~ SOAJ: 140.0000 mg | SUBCUTANEOUS | 11 refills | Status: DC

## 2024-03-04 MED ORDER — NURTEC 75 MG PO TBDP: 1.0000 | ORAL_TABLET | Freq: Every day | ORAL | 11 refills | Status: AC | PRN

## 2024-03-04 NOTE — Patient Instructions (Signed)
 Aimovig  Nurtec and Zofran  as needed Limit use of pain relievers to no more than 9 days out of the month to prevent risk of rebound or medication-overuse headache. Keep headache diary

## 2024-03-07 ENCOUNTER — Ambulatory Visit: Admitting: Podiatry

## 2024-03-07 ENCOUNTER — Encounter: Payer: Self-pay | Admitting: Podiatry

## 2024-03-07 ENCOUNTER — Encounter: Payer: Self-pay | Admitting: Family Medicine

## 2024-03-07 DIAGNOSIS — Z1283 Encounter for screening for malignant neoplasm of skin: Secondary | ICD-10-CM

## 2024-03-07 DIAGNOSIS — R002 Palpitations: Secondary | ICD-10-CM

## 2024-03-07 DIAGNOSIS — M722 Plantar fascial fibromatosis: Secondary | ICD-10-CM | POA: Diagnosis not present

## 2024-03-07 DIAGNOSIS — R079 Chest pain, unspecified: Secondary | ICD-10-CM

## 2024-03-07 DIAGNOSIS — G4730 Sleep apnea, unspecified: Secondary | ICD-10-CM

## 2024-03-07 DIAGNOSIS — M62461 Contracture of muscle, right lower leg: Secondary | ICD-10-CM

## 2024-03-07 MED ORDER — TRIAMCINOLONE ACETONIDE 10 MG/ML IJ SUSP
10.0000 mg | Freq: Once | INTRAMUSCULAR | Status: AC
  Administered 2024-03-07: 10 mg

## 2024-03-07 NOTE — Progress Notes (Signed)
 Subjective:  Patient ID: Sara Ramirez, female    DOB: 1980/06/15,  MRN: [id number]  Chief Complaint  Patient presents with   Foot Pain    Feet were feeling good until 2 days ago soreness and throbbing came back in heel, plantar. Not diabetic.  No anti coag    Discussed the use of AI scribe software for clinical note transcription with the patient, who gave verbal consent to proceed.  History of Present Illness Sara Ramirez is a 43 year old female who presents with recurrent right heel pain.  Over the last few days she has had return of severe right heel pain that had previously improved. Pain is worst with walking and had been intense enough to make her stop activity. New shoes with insoles help, especially with longer walks.  Pain is on the plantar right heel, most on the medial aspect, with shooting pain upward and throbbing at night. Heel squeeze causes some discomfort. She denies posterior heel pain and has only slight pain with ankle flexion.  She takes meloxicam  without side effects and continues stretching exercises.  Heel pain has been present for several months and is now chronic. A prior steroid injection gave significant relief, but pain has recurred.      Objective:    Physical Exam CARDIOVASCULAR: Palpable dorsalis pedis and posterior tibial pulses.  Capillary refill intact to the digits.  No diffuse edema. MUSCULOSKELETAL: Pain on palpation of plantar medial right heel and plantar central heel to a lesser extent, equinus with less than 10 degrees of ankle joint dorsiflexion noted with knees extended and improves slightly with knees flexed, cavus foot type noted bilaterally. NEUROLOGICAL: Negative Tinel's sign, no tingling sensations with percussion of tibial nerve, light touch sensation intact. SKIN: Normal skin texture and turgor.   No images are attached to the encounter.    Results Procedure: Injection of right plantar fascia   Description:  Injected right plantar fascia with 0.5 cc of 0.5% bupivacaine , 0.5 cc of 2% lidocaine , and 1 cc of triamcinolone  acetonide at the right plantar fascia glabrous junction.  Alcohol skin prep.  Bandage applied.  Patient tolerated well.  Pain relief noted.   Assessment:   1. Plantar fasciitis, right   2. Gastrocnemius equinus, right      Plan:  Patient was evaluated and treated and all questions answered.  Assessment and Plan Assessment & Plan Right plantar fasciitis Chronic right plantar fasciitis with recent exacerbation causing significant discomfort during ambulation. Previous steroid injection provided relief, but symptoms have returned. - Fitted for a boot to immobilize the foot, removable for driving, encourage use when on feet. - Recommended night splint for maintaining foot position during sleep. - Continue meloxicam  as needed or daily. - Avoid walking barefoot on hard floors. - Consider even-up device to prevent knee, hip, and back aggravation. - Administered steroid injection with 0.5 cc of 0.5% Marcaine , 0.5 cc of 2% lidocaine , and 1 cc of Kenalog  10 at right plantar fascia glabrous junction. - Transition to naproxen twice a day as needed after completing meloxicam  over the course of the next two weeks. - Encouraged stretching exercises and use of frozen water ball or tennis ball for massage. - Suggested muscle scraping technique with moisturizing cream or Vaseline and butter knife. - Discussed low dye taping technique for additional support, demonstrated to patient today as she has been trying KT tape.  Right gastrocnemius equinus - Continue stretching exercises to address equinus.  Setting of Pes Cavus foot  type      Return in about 4 weeks (around 04/04/2024) for Plantar Fasciitis.

## 2024-03-07 NOTE — Patient Instructions (Signed)

## 2024-03-13 ENCOUNTER — Ambulatory Visit
Admission: RE | Admit: 2024-03-13 | Discharge: 2024-03-13 | Disposition: A | Discharge status: Home / Self Care | Source: Ambulatory Visit | Attending: Family Medicine | Admitting: Family Medicine

## 2024-03-13 DIAGNOSIS — N63 Unspecified lump in unspecified breast: Secondary | ICD-10-CM

## 2024-03-14 ENCOUNTER — Other Ambulatory Visit: Payer: Self-pay

## 2024-03-14 ENCOUNTER — Encounter: Payer: Self-pay | Admitting: Cardiovascular Disease

## 2024-03-14 ENCOUNTER — Other Ambulatory Visit (HOSPITAL_COMMUNITY): Payer: Self-pay

## 2024-03-14 ENCOUNTER — Ambulatory Visit: Attending: Cardiovascular Disease | Admitting: Cardiovascular Disease

## 2024-03-14 VITALS — BP 164/99 | HR 91 | Ht 63.0 in | Wt 229.0 lb

## 2024-03-14 DIAGNOSIS — R002 Palpitations: Secondary | ICD-10-CM | POA: Diagnosis not present

## 2024-03-14 DIAGNOSIS — I10 Essential (primary) hypertension: Secondary | ICD-10-CM | POA: Diagnosis not present

## 2024-03-14 DIAGNOSIS — R079 Chest pain, unspecified: Secondary | ICD-10-CM | POA: Diagnosis not present

## 2024-03-14 MED ORDER — DILTIAZEM HCL ER BEADS 360 MG PO CP24
360.0000 mg | ORAL_CAPSULE | Freq: Every day | ORAL | 3 refills | Status: DC
  Filled 2024-03-14: qty 90, 90d supply, fill #0

## 2024-03-14 MED ORDER — TRIAMTERENE-HCTZ 37.5-25 MG PO CAPS
1.0000 | ORAL_CAPSULE | Freq: Every day | ORAL | 3 refills | Status: DC
  Filled 2024-03-14: qty 90, 90d supply, fill #0

## 2024-03-14 NOTE — Patient Instructions (Addendum)
 Medication Instructions:  Stop Amlodipine  Start Diltiazem  360 mg daily Start triamterene-hydrochlorothiazide  37.5-25 mg daily *If you need a refill on your cardiac medications before your next appointment, please call your pharmacy*  Lab Work: None ordered If you have labs (blood work) drawn today and your tests are completely normal, you will receive your results only by: MyChart Message (if you have MyChart) OR A paper copy in the mail If you have any lab test that is abnormal or we need to change your treatment, we will call you to review the results.  Testing/Procedures: None ordered  Follow-Up: At [facility], you and your health needs are our priority.  As part of our continuing mission to provide you with exceptional heart care, our providers are all part of one team.  This team includes your primary Cardiologist (physician) and Advanced Practice Providers or APPs (Physician Assistants and Nurse Practitioners) who all work together to provide you with the care you need, when you need it.  Your next appointment:   APP- 1 MONTH (will need BMP)  Dr Francyne- 1 yr We recommend signing up for the patient portal called MyChart.  Sign up information is provided on this After Visit Summary.  MyChart is used to connect with patients for Virtual Visits (Telemedicine).  Patients are able to view lab/test results, encounter notes, upcoming appointments, etc.  Non-urgent messages can be sent to your provider as well.   To learn more about what you can do with MyChart, go to [email or web address].

## 2024-03-14 NOTE — Progress Notes (Signed)
 Cardiology Office Note   Date:  03/14/2024  ID:  Sara Ramirez, DOB 1981/01/08, MRN [id number] PCP: Billy Philippe SAUNDERS, NP  [facility] Providers Cardiologist:  None     History of Present Illness Sara Ramirez is a 43 y.o. female referred in consultation by Philippe Billy, NP for palpitations and high blood pressure.  She experiences palpitations described as 'flutters' and a sensation of her chest 'racing' that takes her breath away. These episodes occur randomly, without a specific trigger, and last less than a minute. They occur approximately two to three times a week, sometimes during activities like cleaning or while lying in bed, and can prevent her from speaking during an episode.  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 She was initially treated with Cardizem , which helped alleviate her symptoms (atenolol  was also tried but reportedly made her symptoms worse). However, she discontinued the medication after running out of the prescription and not returning for follow-up. She is currently taking amlodipine , which she notes does not help with her rhythm issues and causes some problems with ankle swelling.  She has a history of high blood pressure, which remains elevated despite medication.   Her family history includes her grandmother, who had congestive heart failure, and her father, who is deceased. She is married with a busy household, including a 42 year old son, a 28 year old stepson, and a 72 year old daughter who is active in sports.   ROS: Denies chest pain, syncope, focal neurological complaints, orthopnea, PND.  Studies Reviewed EKG Interpretation Date/Time:  Thursday March 14 2024 13:37:44 EST Ventricular Rate:  91 PR Interval:  122 QRS Duration:  78 QT Interval:  360 QTC Calculation: 442 R Axis:   36  Text  Interpretation: Normal sinus rhythm Normal ECG When compared with ECG of 08-Jun-2020 20:47, No significant change since last tracing Confirmed by Abdulrahim Siddiqi [id number]) on 03/14/2024 1:58:50 PM    Echocardiogram report from 2013.  I was unable to find the report of the stress test Risk Assessment/Calculations   HYPERTENSION CONTROL Vitals:   03/14/24 1338 03/14/24 2107  BP: (!) 170/108 (!) 164/99    The patient's blood pressure is elevated above target today.  In order to address the patient's elevated BP: A new medication was prescribed today.          Physical Exam VS:  BP (!) 164/99   Pulse 91   Ht 5' 3 (1.6 m)   Wt 229 lb (103.9 kg)   SpO2 98%   BMI 40.57 kg/m        Wt Readings from Last 3 Encounters:  03/14/24 229 lb (103.9 kg)  03/04/24 228 lb (103.4 kg)  02/15/24 232 lb (105.2 kg)    GEN: Well nourished, well developed in no acute distress, morbidly obese NECK: No JVD; No carotid bruits CARDIAC: RRR, no murmurs, rubs, gallops RESPIRATORY:  Clear to auscultation without rales, wheezing or rhonchi  ABDOMEN: Soft, non-tender, non-distended EXTREMITIES:  No edema; No deformity   ASSESSMENT AND PLAN  Palpitations: These responds to diltiazem , but not to atenolol , suggesting they may be PACs.  They are isolated, intermittent palpitations occurring 2-3 times a week, lasting less than a minute, with no specific triggers. Previous stress test and cardiac monitoring were inconclusive. Likely premature atrial contractions (PACs) given the brief nature of episodes and previous improvement with diltiazem .  Highly unlikely to  be harmful, they can cause anxiety.  Switch from amlodipine  to diltiazem  360 mg daily for rhythm control.  Recommended purchasing an [employer] for home rhythm monitoring.  Instructed to record and send EKG tracings during palpitations via MyChart.  Ordered a cardiac monitor for two weeks to capture episodes.  Hypertension: Edema is likely  to persist with diltiazem  just like it does with amlodipine .  Her blood pressure is markedly elevated with a diastolic blood pressure around 100 mmHg.  Add a combination diuretic to manage blood pressure and edema. Started triamterene/hydrochlorothiazide  37.5/25 mg daily.  Instructed to keep a daily log of blood pressure readings. Scheduled follow-up in four weeks to review blood pressure log and labs (check bmet when she returns).       Dispo: Stop amlodipine .  Start diltiazem  sustained-release 360 mg once daily.  Start triamterene-hydrochlorothiazide  37.5-25 mg once daily.  Return in 4 weeks for labs and titration of medications.  Signed, Jerel Balding, MD

## 2024-03-19 ENCOUNTER — Emergency Department (HOSPITAL_COMMUNITY)

## 2024-03-19 ENCOUNTER — Encounter (HOSPITAL_COMMUNITY): Payer: Self-pay | Admitting: Emergency Medicine

## 2024-03-19 ENCOUNTER — Other Ambulatory Visit: Payer: Self-pay

## 2024-03-19 ENCOUNTER — Emergency Department (HOSPITAL_COMMUNITY)
Admission: EM | Admit: 2024-03-19 | Discharge: 2024-03-19 | Disposition: A | Discharge status: Home / Self Care | Attending: Emergency Medicine | Admitting: Emergency Medicine

## 2024-03-19 DIAGNOSIS — E860 Dehydration: Secondary | ICD-10-CM

## 2024-03-19 DIAGNOSIS — R112 Nausea with vomiting, unspecified: Secondary | ICD-10-CM

## 2024-03-19 DIAGNOSIS — R509 Fever, unspecified: Secondary | ICD-10-CM | POA: Diagnosis not present

## 2024-03-19 DIAGNOSIS — R109 Unspecified abdominal pain: Secondary | ICD-10-CM | POA: Diagnosis not present

## 2024-03-19 DIAGNOSIS — I10 Essential (primary) hypertension: Secondary | ICD-10-CM | POA: Diagnosis not present

## 2024-03-19 DIAGNOSIS — R519 Headache, unspecified: Secondary | ICD-10-CM | POA: Diagnosis not present

## 2024-03-19 LAB — CBC WITH DIFFERENTIAL/PLATELET
Abs Immature Granulocytes: 0.01 K/uL (ref 0.00–0.07)
Basophils Absolute: 0 K/uL (ref 0.0–0.1)
Basophils Relative: 0 %
Eosinophils Absolute: 0.1 K/uL (ref 0.0–0.5)
Eosinophils Relative: 1 %
HCT: 45.1 % (ref 36.0–46.0)
Hemoglobin: 15.4 g/dL — ABNORMAL HIGH (ref 12.0–15.0)
Immature Granulocytes: 0 %
Lymphocytes Relative: 18 %
Lymphs Abs: 1.2 K/uL (ref 0.7–4.0)
MCH: 32.7 pg (ref 26.0–34.0)
MCHC: 34.1 g/dL (ref 30.0–36.0)
MCV: 95.8 fL (ref 80.0–100.0)
Monocytes Absolute: 0.5 K/uL (ref 0.1–1.0)
Monocytes Relative: 8 %
Neutro Abs: 4.9 K/uL (ref 1.7–7.7)
Neutrophils Relative %: 73 %
Platelets: 318 K/uL (ref 150–400)
RBC: 4.71 MIL/uL (ref 3.87–5.11)
RDW: 13 % (ref 11.5–15.5)
WBC: 6.8 K/uL (ref 4.0–10.5)
nRBC: 0 % (ref 0.0–0.2)

## 2024-03-19 LAB — COMPREHENSIVE METABOLIC PANEL WITH GFR
ALT: 22 U/L (ref 0–44)
AST: 20 U/L (ref 15–41)
Albumin: 4.7 g/dL (ref 3.5–5.0)
Alkaline Phosphatase: 98 U/L (ref 38–126)
Anion gap: 8 (ref 5–15)
BUN: 16 mg/dL (ref 6–20)
CO2: 31 mmol/L (ref 22–32)
Calcium: 9.6 mg/dL (ref 8.9–10.3)
Chloride: 100 mmol/L (ref 98–111)
Creatinine, Ser: 0.99 mg/dL (ref 0.44–1.00)
GFR, Estimated: >60 mL/min (ref >60)
Glucose, Bld: 88 mg/dL (ref 70–99)
Potassium: 3.9 mmol/L (ref 3.5–5.1)
Sodium: 138 mmol/L (ref 135–145)
Total Bilirubin: 1 mg/dL (ref 0.0–1.2)
Total Protein: 7.8 g/dL (ref 6.5–8.1)

## 2024-03-19 LAB — RESP PANEL BY RT-PCR (RSV, FLU A&B, COVID)  RVPGX2
Influenza A by PCR: NEGATIVE
Influenza B by PCR: NEGATIVE
Resp Syncytial Virus by PCR: NEGATIVE
SARS Coronavirus 2 by RT PCR: NEGATIVE

## 2024-03-19 LAB — URINALYSIS, ROUTINE W REFLEX MICROSCOPIC
Bilirubin Urine: NEGATIVE
Glucose, UA: NEGATIVE mg/dL
Ketones, ur: NEGATIVE mg/dL
Nitrite: NEGATIVE
Protein, ur: 30 mg/dL — AB
Specific Gravity, Urine: 1.023 (ref 1.005–1.030)
pH: 5 (ref 5.0–8.0)

## 2024-03-19 LAB — LIPASE, BLOOD: Lipase: 32 U/L (ref 11–51)

## 2024-03-19 LAB — POC URINE PREG, ED: Preg Test, Ur: NEGATIVE

## 2024-03-19 LAB — MAGNESIUM: Magnesium: 2.1 mg/dL (ref 1.7–2.4)

## 2024-03-19 MED ORDER — PROMETHAZINE HCL 25 MG RE SUPP: 25.0000 mg | Freq: Four times a day (QID) | RECTAL | 0 refills | Status: DC | PRN

## 2024-03-19 MED ORDER — DILTIAZEM HCL ER COATED BEADS 180 MG PO CP24
360.0000 mg | ORAL_CAPSULE | Freq: Every day | ORAL | Status: DC
  Administered 2024-03-19: 12:00:00 360 mg via ORAL
  Filled 2024-03-19: qty 2

## 2024-03-19 MED ORDER — IBUPROFEN 400 MG PO TABS
600.0000 mg | ORAL_TABLET | Freq: Once | ORAL | Status: AC
  Administered 2024-03-19: 11:00:00 600 mg via ORAL
  Filled 2024-03-19: qty 2

## 2024-03-19 MED ORDER — LACTATED RINGERS IV BOLUS
1000.0000 mL | Freq: Once | INTRAVENOUS | Status: AC
  Administered 2024-03-19: 09:00:00 1000 mL via INTRAVENOUS

## 2024-03-19 MED ORDER — DIPHENHYDRAMINE HCL 50 MG/ML IJ SOLN
12.5000 mg | Freq: Once | INTRAMUSCULAR | Status: AC
  Administered 2024-03-19: 09:00:00 12.5 mg via INTRAVENOUS
  Filled 2024-03-19: qty 1

## 2024-03-19 MED ORDER — DILTIAZEM HCL 25 MG/5ML IV SOLN
10.0000 mg | Freq: Once | INTRAVENOUS | Status: AC
  Administered 2024-03-19: 09:00:00 10 mg via INTRAVENOUS
  Filled 2024-03-19: qty 5

## 2024-03-19 MED ORDER — ESCITALOPRAM OXALATE 10 MG PO TABS
10.0000 mg | ORAL_TABLET | Freq: Every day | ORAL | Status: DC
  Administered 2024-03-19: 12:00:00 10 mg via ORAL
  Filled 2024-03-19: qty 1

## 2024-03-19 MED ORDER — OXYCODONE-ACETAMINOPHEN 5-325 MG PO TABS: 1.0000 | ORAL_TABLET | Freq: Once | ORAL | Status: DC

## 2024-03-19 MED ORDER — ONDANSETRON HCL 4 MG/2ML IJ SOLN: 4.0000 mg | Freq: Four times a day (QID) | INTRAMUSCULAR | Status: DC | PRN

## 2024-03-19 MED ORDER — PROMETHAZINE HCL 25 MG PO TABS: 25.0000 mg | ORAL_TABLET | Freq: Four times a day (QID) | ORAL | 0 refills | Status: DC | PRN

## 2024-03-19 MED ORDER — FENTANYL CITRATE (PF) 100 MCG/2ML IJ SOLN
50.0000 ug | Freq: Once | INTRAMUSCULAR | Status: AC
  Administered 2024-03-19: 09:00:00 50 ug via INTRAVENOUS
  Filled 2024-03-19: qty 2

## 2024-03-19 MED ORDER — METOCLOPRAMIDE HCL 5 MG/ML IJ SOLN
10.0000 mg | Freq: Once | INTRAMUSCULAR | Status: AC
  Administered 2024-03-19: 09:00:00 10 mg via INTRAVENOUS
  Filled 2024-03-19: qty 2

## 2024-03-19 NOTE — ED Provider Notes (Signed)
 [place] EMERGENCY DEPARTMENT AT [facility] Provider Note   CSN: [id number] Arrival date & time: 03/19/24  9170     Patient presents with: Emesis   Sara Ramirez is a 43 y.o. female.   HPI Patient presents for multiple complaints.  Medical history includes HTN, anxiety, migraines.  For the past 6 days, patient has had subjective fever, headache, myalgias, nausea, vomiting, and p.o. intolerance.  She was seen by her cardiologist shortly after symptoms started.  At the time, her blood pressure was elevated and she was advised to make some changes to her home antihypertensive medication.  Her amlodipine  was switched to diltiazem .  She was started on triamterene /HCTZ.  She has not been able to take any these medications due to her persistent p.o. intolerance.  Patient describes not being able to even take in fluids.  She has constipation at baseline and will typically need to take a stool softener just to have a bowel movement.  She has not had a bowel movement in the past week.  She has had a generalized abdominal pain.  Current headache pain is occipital with radiation into vertex of scalp.  She has ongoing nausea.  She does have dissolvable Nurtec and dissolvable Zofran  at home which she has tried but feels like she vomits even with these.    Prior to Admission medications  Medication Sig Start Date End Date Taking? Authorizing Provider  Cholecalciferol (VITAMIN D3) 1.25 MG (50000 UT) CAPS Take 1 capsule (1.25 mg total) by mouth every 7 (seven) days. 02/20/24  Yes Billy Philippe SAUNDERS, NP  cyanocobalamin  (VITAMIN B12) 1000 MCG/ML injection Inject 1mL ( )  every 7  days for four weeks then inject 1mL (1000mcg) every 30 days for 2 months. 02/20/24  Yes Billy Philippe SAUNDERS, NP  diltiazem  (TIAZAC ) 360 MG 24 hr capsule Take 1 capsule (360 mg total) by mouth daily. 03/14/24  Yes Croitoru, Mihai, MD  Erenumab -aooe (AIMOVIG ) 140 MG/ML SOAJ Inject 140 mg as directed every 28  (twenty-eight) days. INJECT 140 MG INTO THE SKIN EVERY 28 DAYS 03/04/24  Yes Jaffe, Adam R, DO  escitalopram  (LEXAPRO ) 10 MG tablet Take 1 tablet (10 mg total) by mouth daily. 02/16/24  Yes Billy Philippe SAUNDERS, NP  Iron , Ferrous Sulfate , 325 (65 Fe) MG TABS Take 325 mg by mouth daily. 02/20/24  Yes Billy Philippe SAUNDERS, NP  meloxicam  (MOBIC ) 15 MG tablet Take 1 tablet (15 mg total) by mouth daily. 02/22/24  Yes Semon, Jake L, DPM  ondansetron  (ZOFRAN -ODT) 4 MG disintegrating tablet Take 1 tablet (4 mg total) by mouth every 8 (eight) hours as needed. 03/04/24  Yes Skeet, Adam R, DO  promethazine  (PHENERGAN ) 25 MG suppository Place 1 suppository (25 mg total) rectally every 6 (six) hours as needed for nausea or vomiting. 03/19/24  Yes Melvenia Motto, MD  Rimegepant Sulfate (NURTEC) 75 MG TBDP Take 1 tablet (75 mg total) by mouth daily as needed. 03/04/24  Yes Jaffe, Adam R, DO  triamterene -hydrochlorothiazide  (DYAZIDE ) 37.5-25 MG capsule Take 1 each (1 capsule total) by mouth daily. 03/14/24  Yes Croitoru, Mihai, MD  Needles & Syringes MISC 1 each by Does not apply route once a week. 02/20/24   Billy Philippe SAUNDERS, NP  promethazine  (PHENERGAN ) 25 MG tablet Take 1 tablet (25 mg total) by mouth every 6 (six) hours as needed for nausea. 03/19/24 03/31/24  Melvenia Motto, MD    Allergies: Imitrex  [sumatriptan ] and Toradol  [ketorolac  tromethamine ]    Review of Systems  Constitutional:  Positive for chills, fatigue and fever.  Gastrointestinal:  Positive for abdominal pain, constipation, nausea and vomiting.  Neurological:  Positive for headaches.  All other systems reviewed and are negative.   Updated Vital Signs BP 136/89   Pulse (!) 59   Temp (!) 97.5 F (36.4 C) (Oral)   Resp 12   Ht 5' 3 (1.6 m)   Wt 104 kg   SpO2 100%   BMI 40.61 kg/m   Physical Exam Vitals and nursing note reviewed.  Constitutional:      General: She is not in acute distress.    Appearance: Normal appearance. She is  well-developed. She is not ill-appearing, toxic-appearing or diaphoretic.  HENT:     Head: Normocephalic and atraumatic.     Right Ear: External ear normal.     Left Ear: External ear normal.     Nose: Nose normal.     Mouth/Throat:     Mouth: Mucous membranes are moist.  Eyes:     Extraocular Movements: Extraocular movements intact.     Conjunctiva/sclera: Conjunctivae normal.  Cardiovascular:     Rate and Rhythm: Normal rate and regular rhythm.  Pulmonary:     Effort: Pulmonary effort is normal. No respiratory distress.     Breath sounds: No wheezing or rales.  Chest:     Chest wall: No tenderness.  Abdominal:     General: There is no distension.     Palpations: Abdomen is soft.     Tenderness: There is abdominal tenderness. There is no guarding or rebound.  Musculoskeletal:        General: No swelling or deformity. Normal range of motion.     Cervical back: Normal range of motion and neck supple.  Skin:    General: Skin is warm and dry.     Coloration: Skin is not jaundiced or pale.  Neurological:     General: No focal deficit present.     Mental Status: She is alert and oriented to person, place, and time.     Cranial Nerves: No cranial nerve deficit.     Sensory: No sensory deficit.     Motor: No weakness.     Coordination: Coordination normal.  Psychiatric:        Mood and Affect: Mood normal.        Behavior: Behavior normal.     (all labs ordered are listed, but only abnormal results are displayed) Labs Reviewed  CBC WITH DIFFERENTIAL/PLATELET - Abnormal; Notable for the following components:      Result Value   Hemoglobin 15.4 (*)    All other components within normal limits  URINALYSIS, ROUTINE W REFLEX MICROSCOPIC - Abnormal; Notable for the following components:   Color, Urine AMBER (*)    APPearance CLOUDY (*)    Hgb urine dipstick LARGE (*)    Protein, ur 30 (*)    Leukocytes,Ua MODERATE (*)    Bacteria, UA RARE (*)    All other components within  normal limits  RESP PANEL BY RT-PCR (RSV, FLU A&B, COVID)  RVPGX2  COMPREHENSIVE METABOLIC PANEL WITH GFR  LIPASE, BLOOD  MAGNESIUM  POC URINE PREG, ED    EKG: EKG Interpretation Date/Time:  Tuesday March 19 2024 09:30:31 EST Ventricular Rate:  109 PR Interval:  133 QRS Duration:  106 QT Interval:  370 QTC Calculation: 499 R Axis:   71  Text Interpretation: Sinus tachycardia Low voltage, precordial leads Borderline prolonged QT interval Confirmed by Melvenia Motto ([id number]) on 03/19/2024 10:24:43  AM  Radiology: US  PELVIS (TRANSABDOMINAL ONLY) Result Date: 03/19/2024 EXAM: PELVIC ULTRASOUND TECHNIQUE: Transabdominal pelvic duplex ultrasound using B-mode/gray scaled imaging with Doppler spectral analysis and color flow was obtained. COMPARISON: US  Pelvis 04/10/2008. CLINICAL HISTORY: Pelvic pain. FINDINGS: ULTRASOUND FINDINGS: UTERUS: Uterus measures 8.1 x 4.9 x 6.2 cm with a volume of 130.6 cc. Anteverted uterus. Uterus demonstrates normal myometrial echotexture. ENDOMETRIAL STRIPE: Endometrial stripe measures 5.0 mm. Endometrial stripe is within normal limits. RIGHT OVARY: Right ovary measures 4.9 x 3.1 x 4.4 cm with a volume of 34.94 cc. Right ovary is within normal limits. There is normal arterial and venous Doppler flow. LEFT OVARY: Left ovary measures 9.0 x 4.6 x 5.4 cm with a volume of 117.48 cc. Cyst within the left ovary measures 7.6 x 5.0 x 7.7 cm and contains a single internal area of septation measuring 4 to 5 mm. There is normal arterial and venous Doppler flow. FREE FLUID: No free fluid. IMPRESSION: 1. Large left ovarian cyst measuring up to 7.7 cm with a single internal septation measuring 4-5 mm, favor mildly complicated cyst; recommend MRI with IV contrast or gynecologic surgical evaluation. Electronically signed by: Waddell Calk MD 03/19/2024 12:05 PM EST RP Workstation: [id number]   CT ABDOMEN PELVIS WO CONTRAST Result Date: 03/19/2024 EXAM: CT ABDOMEN AND PELVIS WITHOUT  CONTRAST 03/19/2024 10:16:01 AM TECHNIQUE: CT of the abdomen and pelvis was performed without the administration of intravenous contrast. Multiplanar reformatted images are provided for review. Automated exposure control, iterative reconstruction, and/or weight-based adjustment of the mA/kV was utilized to reduce the radiation dose to as low as reasonably achievable. COMPARISON: None available. CLINICAL HISTORY: Abdominal pain, acute, nonlocalized. FINDINGS: LOWER CHEST: No acute abnormality. LIVER: The liver is unremarkable. GALLBLADDER AND BILE DUCTS: Post cholecystectomy. No biliary ductal dilatation. SPLEEN: No acute abnormality. PANCREAS: No acute abnormality. ADRENAL GLANDS: No acute abnormality. KIDNEYS, URETERS AND BLADDER: No stones in the kidneys or ureters. No hydronephrosis. No perinephric or periureteral stranding. Urinary bladder is unremarkable. GI AND BOWEL: Stomach demonstrates no acute abnormality. No small or large bowel thickening or dilatation. There is no bowel obstruction. The appendix is unremarkable. PERITONEUM AND RETROPERITONEUM: No ascites. No free air. VASCULATURE: Aorta is normal in caliber. LYMPH NODES: No lymphadenopathy. REPRODUCTIVE ORGANS: Internal increase in size of a multiloculated left ovarian cyst measuring up to 6.2 x 7.2 cm (from 4.5 cm). The right ovary is unremarkable. The uterus is unremarkable. BONES AND SOFT TISSUES: No acute osseous abnormality. No focal soft tissue abnormality. IMPRESSION: 1. No acute abdominopelvic abnormality. 2. Interval increase in size of a multiloculated left ovarian cyst to 6.2 x 7.2 cm. Recommend gynecology consultation; consider prompt pelvic ultrasound for further characterization. Electronically signed by: Morgane Naveau MD 03/19/2024 10:35 AM EST RP Workstation: [id number]   CT Head Wo Contrast Result Date: 03/19/2024 EXAM: CT HEAD WITHOUT 03/19/2024 10:16:01 AM TECHNIQUE: CT of the head was performed without the administration of  intravenous contrast. Automated exposure control, iterative reconstruction, and/or weight based adjustment of the mA/kV was utilized to reduce the radiation dose to as low as reasonably achievable. COMPARISON: None available. CLINICAL HISTORY: Headache, increasing frequency or severity. FINDINGS: BRAIN AND VENTRICLES: No acute intracranial hemorrhage. No mass effect or midline shift. No extra-axial fluid collection. No evidence of acute infarct. No hydrocephalus. Incidentally noted cavum septum variant. ORBITS: No acute abnormality. SINUSES AND MASTOIDS: No acute abnormality. SOFT TISSUES AND SKULL: No acute skull fracture. No acute soft tissue abnormality. IMPRESSION: 1. No acute intracranial abnormality. Electronically signed by: Morgane  Naveau MD 03/19/2024 10:32 AM EST RP Workstation: [id number]     Procedures   Medications Ordered in the ED  ondansetron  (ZOFRAN ) injection 4 mg (has no administration in time range)  escitalopram  (LEXAPRO ) tablet 10 mg (10 mg Oral Given 03/19/24 1211)  diltiazem  (CARDIZEM  CD) 24 hr capsule 360 mg (360 mg Oral Given 03/19/24 1211)  metoCLOPramide  (REGLAN ) injection 10 mg (10 mg Intravenous Given 03/19/24 0928)  lactated ringers  bolus 1,000 mL (0 mLs Intravenous Stopped 03/19/24 1123)  diphenhydrAMINE  (BENADRYL ) injection 12.5 mg (12.5 mg Intravenous Given 03/19/24 0928)  diltiazem  (CARDIZEM ) injection 10 mg (10 mg Intravenous Given 03/19/24 0928)  fentaNYL  (SUBLIMAZE ) injection 50 mcg (50 mcg Intravenous Given 03/19/24 0928)  ibuprofen  (ADVIL ) tablet 600 mg (600 mg Oral Given 03/19/24 1119)                                    Medical Decision Making Amount and/or Complexity of Data Reviewed Labs: ordered. Radiology: ordered.  Risk Prescription drug management.   Patient presenting for recent flulike symptoms in addition to p.o. intolerance for the past 6 days.  Vital signs on arrival are notable for hypertension and tachycardia.  She has not been able  to take her home medications since onset of her symptoms.  Patient was recently prescribed 360 mg daily diltiazem .  Will give IV dose here in the ED.  IV fluids and medications for symptomatic relief were ordered.  Lab work and imaging studies ordered as well.  Patient's lab work showed normal hemoglobin, no leukocytosis, normal kidney function, normal electrolytes, and normal hepatobiliary enzymes.  Her urinalysis was equivocal.  She has not had any significant urine symptoms to suggest UTI.  On her CT scan, she did have a enlarged left ovarian cyst.  Ultrasound was ordered to further characterize.  Ultrasound findings suggest a mildly complicated cyst.  Patient was advised to follow-up with OB/GYN regarding this.  While in the ED, she did have resolution of her nausea and improvement of her headache.  She is stable for discharge.     Final diagnoses:  Nausea and vomiting, unspecified vomiting type  Dehydration    ED Discharge Orders          Ordered    promethazine  (PHENERGAN ) 25 MG suppository  Every 6 hours PRN        03/19/24 1224    promethazine  (PHENERGAN ) 25 MG tablet  Every 6 hours PRN        03/19/24 1225               Melvenia Motto, MD 03/19/24 1227

## 2024-03-19 NOTE — ED Notes (Signed)
 Patient transported to Ultrasound

## 2024-03-19 NOTE — ED Notes (Signed)
 Pt returned from US  Study.

## 2024-03-19 NOTE — ED Triage Notes (Signed)
 Pt states she has had emesis, nausea and a headache since Wednesday. Endorses fever and chills. Afebrile today. Pt has not been able to take her BP meds since wednesday so pt is hypertensive.

## 2024-03-19 NOTE — Discharge Instructions (Addendum)
 Your imaging today showed the following: Large left ovarian cyst measuring up to 7.7 cm with a single internal septation measuring 4-5 mm, favor mildly complicated cyst You should follow-up with OB/GYN for this.  This may be the cause of your recent abdominal discomfort.  You may at some point need surgical removal of this.  The rest of your test results were reassuring.  Continue to drink plenty of fluids at home to stay hydrated.  Take Zofran  as needed for nausea.  A prescription for Phenergan  suppositories was sent to your pharmacy.  These can be used if you are unable to take the oral nausea medications.  If you have any new or worsening symptoms of concern, return to the emergency department.

## 2024-03-19 NOTE — ED Notes (Signed)
Pt returned from CT Scan 

## 2024-03-20 ENCOUNTER — Encounter: Payer: Self-pay | Admitting: Cardiovascular Disease

## 2024-03-20 DIAGNOSIS — R002 Palpitations: Secondary | ICD-10-CM

## 2024-03-20 NOTE — Telephone Encounter (Signed)
 Med list updated

## 2024-03-20 NOTE — Telephone Encounter (Signed)
 She took diltiazem  in the past without side effects, so that I suspect the symptoms are from the triamterene  hydrochlorothiazide , which we will stop.  Please continue taking the diltiazem .  The purpose of the other medication was to try to prevent the swelling that she has had with calcium channel blockers before.  Let's just see how she does without any diuretics for a couple of weeks

## 2024-04-02 ENCOUNTER — Encounter: Payer: Self-pay | Admitting: Adult Health

## 2024-04-02 ENCOUNTER — Ambulatory Visit (INDEPENDENT_AMBULATORY_CARE_PROVIDER_SITE_OTHER): Admitting: Adult Health

## 2024-04-02 VITALS — BP 150/95 | HR 79 | Temp 98.2°F | Ht 63.0 in | Wt 224.8 lb

## 2024-04-02 DIAGNOSIS — F5104 Psychophysiologic insomnia: Secondary | ICD-10-CM

## 2024-04-02 DIAGNOSIS — Z6839 Body mass index (BMI) 39.0-39.9, adult: Secondary | ICD-10-CM

## 2024-04-02 DIAGNOSIS — R400 Somnolence: Secondary | ICD-10-CM

## 2024-04-02 DIAGNOSIS — R0683 Snoring: Secondary | ICD-10-CM

## 2024-04-02 DIAGNOSIS — R631 Polydipsia: Secondary | ICD-10-CM

## 2024-04-02 DIAGNOSIS — G4719 Other hypersomnia: Secondary | ICD-10-CM

## 2024-04-02 NOTE — Patient Instructions (Signed)
 Set up for home sleep study  Work on healthy sleep regimen  Do not drive if sleepy Work on healthy weight  Follow up in 6 weeks -[facility]

## 2024-04-02 NOTE — Progress Notes (Signed)
 "  @Patient  ID: Sara Ramirez, female    DOB: [birth year], 43 y.o.   MRN: [id number]  Chief Complaint  Patient presents with   Consult    SLEEP    Referring provider: Billy Philippe SAUNDERS, NP  HPI: 43 year old female seen for sleep consult for snoring and daytime sleepiness    TEST/EVENTS : Reviewed 04/02/2024  Discussed the use of AI scribe software for clinical note transcription with the patient, who gave verbal consent to proceed.  History of Present Illness Sara Ramirez is a 43 year old female with medical history significant for high blood pressure and migraines who presents with sleep disturbances.  She has experienced sleep disturbances for several years, characterized by snoring, difficulty falling asleep, staying asleep, and restless sleep. She feels tired and sleepy throughout the day. She has not undergone a sleep study before.  She has tried trazodone  in the past for insomnia , but it was ineffective. She does not use other sleep aids such as melatonin or Ambien. Her sleep schedule involves going to bed around 11 PM and waking up around 8 AM, with multiple awakenings during the night. She notes that she can fall asleep easily when under a heated blanket while watching TV.  Her caffeine  intake includes one can of [facility] daily, and she has been drinking more water recently. She does not consume coffee or energy drinks and avoids caffeine  after 6 PM to prevent nighttime awakenings.  She has a history of high blood pressure for 17 years and experiences chronic headaches . No history of congestive heart failure or stroke personally, though her grandmother had a stroke.  She works in medical records during the day shift and has two children aged 93 and 55. She does not smoke or use alcohol or drugs.  She is married.  Her husband has sleep apnea  No history of sleepwalking or sleep talking.  No symptoms suspicious for cataplexy or  sleep  paralysis     Allergies[1]  Immunization History  Administered Date(s) Administered   Influenza-Unspecified 03/05/2015   Tdap 11/18/2022    Past Medical History:  Diagnosis Date   Anxiety    Hypertension    Kidney stone    Migraines    Mitral regurgitation    Renal disorder    kidney stones   Tricuspid regurgitation     Tobacco History: Tobacco Use History[2] Counseling given: Not Answered   Outpatient Medications Prior to Visit  Medication Sig Dispense Refill   Cholecalciferol (VITAMIN D3) 1.25 MG (50000 UT) CAPS Take 1 capsule (1.25 mg total) by mouth every 7 (seven) days. 12 capsule 0   cyanocobalamin  (VITAMIN B12) 1000 MCG/ML injection Inject 1mL ( )  every 7  days for four weeks then inject 1mL (1000mcg) every 30 days for 2 months. 1 mL 5   diltiazem  (TIAZAC ) 360 MG 24 hr capsule Take 1 capsule (360 mg total) by mouth daily. 90 capsule 3   Erenumab -aooe (AIMOVIG ) 140 MG/ML SOAJ Inject 140 mg as directed every 28 (twenty-eight) days. INJECT 140 MG INTO THE SKIN EVERY 28 DAYS 1 mL 11   escitalopram  (LEXAPRO ) 10 MG tablet Take 1 tablet (10 mg total) by mouth daily. 90 tablet 3   Iron , Ferrous Sulfate , 325 (65 Fe) MG TABS Take 325 mg by mouth daily. 90 tablet 0   Needles & Syringes MISC 1 each by Does not apply route once a week. 6 each 0   ondansetron  (ZOFRAN -ODT) 4 MG disintegrating tablet Take  1 tablet (4 mg total) by mouth every 8 (eight) hours as needed. 20 tablet 5   Rimegepant Sulfate (NURTEC) 75 MG TBDP Take 1 tablet (75 mg total) by mouth daily as needed. 16 tablet 11   meloxicam  (MOBIC ) 15 MG tablet Take 1 tablet (15 mg total) by mouth daily. (Patient not taking: Reported on 04/02/2024) 30 tablet 0   promethazine  (PHENERGAN ) 25 MG suppository Place 1 suppository (25 mg total) rectally every 6 (six) hours as needed for nausea or vomiting. (Patient not taking: Reported on 04/02/2024) 12 each 0   promethazine  (PHENERGAN ) 25 MG tablet Take 1 tablet (25 mg  total) by mouth every 6 (six) hours as needed for nausea. (Patient not taking: Reported on 04/02/2024) 12 tablet 0   No facility-administered medications prior to visit.     Review of Systems:   Constitutional:   No  weight loss, night sweats,  Fevers, chills,+ fatigue, or  lassitude.  HEENT:   No headaches,  Difficulty swallowing,  Tooth/dental problems, or  Sore throat,                No sneezing, itching, ear ache, nasal congestion, post nasal drip,   CV:  No chest pain,  Orthopnea, PND, swelling in lower extremities, anasarca, dizziness, palpitations, syncope.   GI  No heartburn, indigestion, abdominal pain, nausea, vomiting, diarrhea, change in bowel habits, loss of appetite, bloody stools.   Resp: No shortness of breath with exertion or at rest.  No excess mucus, no productive cough,  No non-productive cough,  No coughing up of blood.  No change in color of mucus.  No wheezing.  No chest wall deformity  Skin: no rash or lesions.  GU: no dysuria, change in color of urine, no urgency or frequency.  No flank pain, no hematuria   MS:  No joint pain or swelling.  No decreased range of motion.  No back pain.    Physical Exam  BP (!) 150/95   Pulse 79   Temp 98.2 F (36.8 C)   Ht 5' 3 (1.6 m) Comment: per pt  Wt 224 lb 12.8 oz (102 kg)   SpO2 99% Comment: ra  BMI 39.82 kg/m   GEN: A/Ox3; pleasant , NAD, well nourished    HEENT:  [place]/AT,   NOSE-clear, THROAT-clear, no lesions, no postnasal drip or exudate noted. Class 2-3 MP airway   NECK:  Supple w/ fair ROM; no JVD; normal carotid impulses w/o bruits; no thyromegaly or nodules palpated; no lymphadenopathy.    RESP  Clear  P & A; w/o, wheezes/ rales/ or rhonchi. no accessory muscle use, no dullness to percussion  CARD:  RRR, no m/r/g, no peripheral edema, pulses intact, no cyanosis or clubbing.  GI:   Soft & nt; nml bowel sounds; no organomegaly or masses detected.   Musco: Warm bil, no deformities or joint swelling  noted.   Neuro: alert, no focal deficits noted.    Skin: Warm, no lesions or rashes    Lab Results:Reviewed 04/02/2024   CBC    Component Value Date/Time   WBC 6.8 03/19/2024 0936   RBC 4.71 03/19/2024 0936   HGB 15.4 (H) 03/19/2024 0936   HCT 45.1 03/19/2024 0936   PLT 318 03/19/2024 0936   MCV 95.8 03/19/2024 0936   MCH 32.7 03/19/2024 0936   MCHC 34.1 03/19/2024 0936   RDW 13.0 03/19/2024 0936   LYMPHSABS 1.2 03/19/2024 0936   MONOABS 0.5 03/19/2024 0936   EOSABS 0.1 03/19/2024  0936   BASOSABS 0.0 03/19/2024 0936    BMET    Component Value Date/Time   NA 138 03/19/2024 0936   K 3.9 03/19/2024 0936   CL 100 03/19/2024 0936   CO2 31 03/19/2024 0936   GLUCOSE 88 03/19/2024 0936   BUN 16 03/19/2024 0936   CREATININE 0.99 03/19/2024 0936   CALCIUM 9.6 03/19/2024 0936   GFRNONAA >60 03/19/2024 0936   GFRAA >60 09/25/2016 1852    BNP No results found for: BNP  ProBNP No results found for: PROBNP  Imaging:         No data to display          No results found for: NITRICOXIDE     04/02/2024    2:00 PM  Results of the [place] flowsheet  Sitting and reading 3  Watching TV 3  Sitting, inactive in a public place (e.g. a theatre or a meeting) 3  As a passenger in a car for an hour without a break 3  Lying down to rest in the afternoon when circumstances permit 3  Sitting and talking to someone 1  Sitting quietly after a lunch without alcohol 2  In a car, while stopped for a few minutes in traffic 2  Total score 20        Assessment & Plan:   Assessment and Plan Assessment & Plan Snoring, restless sleep and significant daytime sleepiness -suspicious for sleep apnea Symptoms include snoring, restless sleep, frequent awakenings, and daytime sleepiness, suggesting sleep-disordered breathing.Sara Ramirez No prior sleep study has been conducted. Ordered a home sleep study  . A follow-up appointment is scheduled in six weeks to review the results.  Provided information on sleep apnea. - discussed how weight can impact sleep and risk for sleep disordered breathing - discussed options to assist with weight loss: combination of diet modification, cardiovascular and strength training exercises   - had an extensive discussion regarding the adverse health consequences related to untreated sleep disordered breathing - specifically discussed the risks for hypertension, coronary artery disease, cardiac dysrhythmias, cerebrovascular disease, and diabetes - lifestyle modification discussed   - discussed how sleep disruption can increase risk of accidents, particularly when driving - safe driving practices were discussed     Insomnia  -chronic ongoing  Chronic insomnia is characterized by difficulty initiating and maintaining sleep. A previous trial of trazodone  was ineffective, and she is not currently using sleep aids. Sleep apnea may contribute to insomnia symptoms. Will evaluate for sleep apnea as a contributing factor. Will discuss treatment options for insomnia if sleep apnea is ruled out.  BMI 39-healthy weight loss discussed      Sara Stank, NP 04/02/2024       [1]  Allergies Allergen Reactions   Imitrex  [Sumatriptan ] Nausea Only   Toradol  [Ketorolac  Tromethamine ] Nausea And Vomiting  [2]  Social History Tobacco Use  Smoking Status Never  Smokeless Tobacco Never   "

## 2024-04-03 ENCOUNTER — Ambulatory Visit: Attending: Cardiovascular Disease

## 2024-04-03 DIAGNOSIS — R002 Palpitations: Secondary | ICD-10-CM

## 2024-04-03 NOTE — Telephone Encounter (Signed)
 Per Dr. Francyne office note on 03/14/24:     Will place order for 2 week zio monitor.

## 2024-04-03 NOTE — Progress Notes (Unsigned)
 Enrolled for [employer] to mail a ZIO XT long term holter monitor to the patients address on file.

## 2024-04-03 NOTE — Addendum Note (Signed)
 Addended by: LORRENE FEDERICO CROME on: 04/03/2024 08:45 AM   Modules accepted: Orders

## 2024-04-05 ENCOUNTER — Ambulatory Visit: Admitting: Podiatry

## 2024-04-05 ENCOUNTER — Encounter: Payer: Self-pay | Admitting: Podiatry

## 2024-04-05 DIAGNOSIS — F603 Borderline personality disorder: Secondary | ICD-10-CM | POA: Insufficient documentation

## 2024-04-05 DIAGNOSIS — M62461 Contracture of muscle, right lower leg: Secondary | ICD-10-CM | POA: Diagnosis not present

## 2024-04-05 DIAGNOSIS — Z789 Other specified health status: Secondary | ICD-10-CM | POA: Insufficient documentation

## 2024-04-05 DIAGNOSIS — M722 Plantar fascial fibromatosis: Secondary | ICD-10-CM | POA: Diagnosis not present

## 2024-04-05 MED ORDER — MELOXICAM 15 MG PO TABS: 15.0000 mg | ORAL_TABLET | Freq: Every day | ORAL | 0 refills | Status: AC

## 2024-04-05 MED ORDER — METHYLPREDNISOLONE 4 MG PO TBPK: ORAL_TABLET | ORAL | 0 refills | Status: AC

## 2024-04-05 NOTE — Progress Notes (Signed)
"  °  Subjective:  Patient ID: Sara Ramirez, female    DOB: Sep 29, 1980,  MRN: [id number]  Chief Complaint  Patient presents with   Plantar Fasciitis    4 week follow up right foot - she felt great for 3 weeks - started hurting again last week    Discussed the use of AI scribe software for clinical note transcription with the patient, who gave verbal consent to proceed.  History of Present Illness Sara Ramirez Nat is a 44 year old female with chronic right plantar fasciitis and right gastrocnemius equinus who presents with worsening right heel pain.  She has had right heel pain for 4-5 months with initial improvement, then acute worsening over the past 1-1.5 weeks. Pain is throbbing, 6-7/10, and localized to the plantar and medial right heel without posterior heel pain.  She has used supportive footwear, orthotic inserts, and a walking boot at home, which she avoids while driving. She previously tried KT taping under her foot with unclear benefit and is not using other taping.  She completed a 30-day course of meloxicam  but stopped when she was unable to take oral medications during an intercurrent illness and is not taking it now. A prior steroid injection helped, and she has tolerated oral steroids in the past without adverse effects.  She has not had physical therapy, advanced imaging, or surgery for this condition.      Objective:    Physical Exam CARDIOVASCULAR: Palpable dorsalis pedis and posterior tibial pulses.  Capillary refill intact to the digits.  No diffuse edema. MUSCULOSKELETAL: Pain on palpation of plantar medial right heel and plantar central heel, equinus with less than 10 degrees of ankle joint dorsiflexion noted with knees extended and improves slightly with knees flexed, high arch foot type noted bilaterally. NEUROLOGICAL: Negative Tinel's sign, no tingling sensations with percussion of tibial nerve, light touch sensation intact. SKIN: Normal skin texture  and turgor.   No images are attached to the encounter.    Results    Assessment:   1. Plantar fasciitis, right   2. Gastrocnemius equinus, right      Plan:  Patient was evaluated and treated and all questions answered.  Assessment and Plan Assessment & Plan Plantar fasciitis, right Chronic plantar fasciitis with persistent right heel pain. Conservative measures provided relief. Briefly discussed surgery but recommend that we exhaust conservative therapy options first. Discussed alternative modalities if physical therapy fails. - Refilled meloxicam , instructed regular use for two weeks after completion of the Medrol  Dosepak, then as needed. - Prescribed 6 day Medrol  Dosepak. - Ordered referral to physical therapy for stretching and gait assessment. - Advised use of walking boot at home, supportive footwear outside, avoid barefoot on hard surfaces. - Recommended Tuli's heel cups for heel support. - Scheduled follow-up in six weeks. - Conditional plan to contact office if pain becomes severe.  Gastrocnemius equinus, right Persistent gastrocnemius tightness contributing to plantar fasciitis symptoms.      Return in about 6 weeks (around 05/17/2024) for Plantar Fasciitis.    "

## 2024-04-05 NOTE — Patient Instructions (Addendum)

## 2024-04-10 ENCOUNTER — Encounter: Payer: Self-pay | Admitting: Cardiovascular Disease

## 2024-04-10 NOTE — Progress Notes (Signed)
 "  Cardiology Office Note    Date:  04/12/2024  ID:  Sara Ramirez, DOB 31-Aug-1980, MRN [id number] PCP:  Billy Philippe SAUNDERS, NP  Cardiologist:  None  Electrophysiologist:  None   Chief Complaint: Palpitations, HTN   History of Present Illness: .    Sara Ramirez is a 44 y.o. female with visit-pertinent history of palpitations and high blood pressure.  Patient recently evaluated by Dr. Francyne on 03/14/2024 for palpitations.  In 2015 she was told that she had two leaky heart valves, review of echo only shows trivial MR and trace TR. she was initially treated with Cardizem  which alleviated her symptoms, atenolol  was also tried however reportedly made her symptoms worse.  When seen by Dr. Francyne she was experiencing palpitations described as a flutter and a sensation of chest racing and taking her breath away.  She notes these episodes would occur randomly without a specific trigger.  Was noted she had high blood pressure that remained elevated despite medication.  Patient was switched from amlodipine  to diltiazem  360 mg daily for rhythm control, 2-week cardiac monitored ordered for further evaluation.  Patient was started on triamterene -hydrochlorothiazide  37.5/25 mg daily.  On 12/17 patient notified the office that she presented to the ED with fatigue, headaches nausea and heaviness.  Her triamterene /hydrochlorothiazide  was discontinued.  Today she presents for follow-up.  She reports that she was unable to tolerate higher dose diltiazem , reported significant fatigue, chest heaviness and feeling as though she was unable to function.  Patient reports that she has been intermittently taking amlodipine  following discontinuation as her blood pressure had been uncontrolled at home reporting readings with systolics in the 170s and 190s.  She notes earlier this week she had an episode of chest discomfort with elevated blood pressure, resolved with deep breathing.  She notes some shortness of  breath typically associated with increased palpitations that still occur a few times a day described as heart racing.  She notes intermittent lower extremity swelling.  Patient received her heart monitor earlier this week, is currently in place.  She is also pending a sleep study.  ROS: .   Today she denies melena, hematuria, hemoptysis, diaphoresis, weakness, presyncope, syncope, orthopnea, and PND.  All other systems are reviewed and otherwise negative. Studies Reviewed: SABRA   EKG:  EKG is not ordered today.  CV Studies: Cardiac studies reviewed are outlined and summarized above. Otherwise please see EMR for full report.      Current Reported Medications:.    Active Medications[1]  Physical Exam:    VS:  BP (!) 166/92   Pulse 62   Ht 5' 3 (1.6 m)   Wt 223 lb 6.4 oz (101.3 kg)   SpO2 99%   BMI 39.57 kg/m    Wt Readings from Last 3 Encounters:  04/12/24 223 lb 6.4 oz (101.3 kg)  04/02/24 224 lb 12.8 oz (102 kg)  03/19/24 229 lb 4.5 oz (104 kg)    GEN: Well nourished, well developed in no acute distress NECK: No JVD; No carotid bruits CARDIAC: RRR, no murmurs, rubs, gallops RESPIRATORY:  Clear to auscultation without rales, wheezing or rhonchi  ABDOMEN: Soft, non-tender, non-distended EXTREMITIES:  No edema; No acute deformity     Asessement and Plan:.    Palpitations/shortness of breath: Patient has noted feelings of heart racing that can occur multiple times a day, has associated shortness of breath.  She has previously been on atenolol  resulting in significant fatigue.  Patient tried diltiazem  360 mg  daily, she tried the medication once resulting in significant fatigue, chest heaviness and inability to function, felt that the medication dosage was too high.  On discussion with patient she has previously tolerated diltiazem  30 mg twice daily, is willing to retry this.  Cardiac monitor is in place, given chest heaviness and reported shortness of breath will also proceed with  echocardiogram.  Reviewed ED precautions.  Start diltiazem  30 mg twice daily.  Hypertension: Blood pressure today 136/88, on recheck 166/92.  Patient reports significant fluctuations in blood pressure at home, did not tolerate triamterene -hydrochlorothiazide  or high-dose diltiazem .  Patient reports that she has previously tolerated lisinopril  10 mg daily, is willing to retry with diltiazem  30 mg twice daily.  Discussed also adding hydrochlorothiazide  12.5 mg daily if needed given intermittent lower extremity edema.  Patient will keep blood pressure log at home and will have close follow-up for further blood pressure monitoring and medication titration. Amlodipine  caused increased lower extremity edema. Triamterene -hydrochlorothiazide  caused significant fatigue, inability to function.  Start diltiazem  30 mg twice daily and lisinopril  10 mg daily. Check BMET in two weeks.   OSA: Sleep study currently pending with pulmonology.   Disposition: F/u with Prabhleen Montemayor, NP in 3-4 weeks.   Signed, Celest Reitz D Kirstin Kugler, NP       [1]  Current Meds  Medication Sig   Cholecalciferol (VITAMIN D3) 1.25 MG (50000 UT) CAPS Take 1 capsule (1.25 mg total) by mouth every 7 (seven) days.   cyanocobalamin  (VITAMIN B12) 1000 MCG/ML injection Inject 1mL ( )  every 7  days for four weeks then inject 1mL ( ) every 30 days for 2 months.   diltiazem  (CARDIZEM ) 30 MG tablet Take 1 tablet (30 mg total) by mouth 2 (two) times daily.   Erenumab -aooe (AIMOVIG ) 140 MG/ML SOAJ Inject 140 mg as directed every 28 (twenty-eight) days. INJECT 140 MG INTO THE SKIN EVERY 28 DAYS   escitalopram  (LEXAPRO ) 10 MG tablet Take 1 tablet (10 mg total) by mouth daily.   Iron , Ferrous Sulfate , 325 (65 Fe) MG TABS Take 325 mg by mouth daily.   lisinopril  (ZESTRIL ) 10 MG tablet Take 1 tablet (10 mg total) by mouth daily.   meloxicam  (MOBIC ) 15 MG tablet Take 1 tablet (15 mg total) by mouth daily.   methylPREDNISolone  (MEDROL  DOSEPAK) 4 MG  TBPK tablet 6 Day Tapering Dose   Needles & Syringes MISC 1 each by Does not apply route once a week.   ondansetron  (ZOFRAN -ODT) 4 MG disintegrating tablet Take 1 tablet (4 mg total) by mouth every 8 (eight) hours as needed.   Rimegepant Sulfate (NURTEC) 75 MG TBDP Take 1 tablet (75 mg total) by mouth daily as needed.   "

## 2024-04-12 ENCOUNTER — Encounter: Payer: Self-pay | Admitting: Cardiology

## 2024-04-12 ENCOUNTER — Other Ambulatory Visit (HOSPITAL_COMMUNITY): Payer: Self-pay

## 2024-04-12 ENCOUNTER — Ambulatory Visit: Attending: Cardiology | Admitting: Cardiology

## 2024-04-12 VITALS — BP 166/92 | HR 62 | Ht 63.0 in | Wt 223.4 lb

## 2024-04-12 DIAGNOSIS — R002 Palpitations: Secondary | ICD-10-CM

## 2024-04-12 DIAGNOSIS — Z79899 Other long term (current) drug therapy: Secondary | ICD-10-CM | POA: Diagnosis not present

## 2024-04-12 DIAGNOSIS — R600 Localized edema: Secondary | ICD-10-CM

## 2024-04-12 DIAGNOSIS — R0602 Shortness of breath: Secondary | ICD-10-CM

## 2024-04-12 DIAGNOSIS — I10 Essential (primary) hypertension: Secondary | ICD-10-CM | POA: Diagnosis not present

## 2024-04-12 MED ORDER — DILTIAZEM HCL 30 MG PO TABS
30.0000 mg | ORAL_TABLET | Freq: Two times a day (BID) | ORAL | 11 refills | Status: AC
  Filled 2024-04-12: qty 60, 30d supply, fill #0
  Filled 2024-04-30 – 2024-05-04 (×2): qty 60, 30d supply, fill #1
  Filled ????-??-??: fill #1

## 2024-04-12 MED ORDER — LISINOPRIL 10 MG PO TABS
10.0000 mg | ORAL_TABLET | Freq: Every day | ORAL | 11 refills | Status: AC
  Filled 2024-04-12: qty 30, 30d supply, fill #0
  Filled 2024-04-30 – 2024-05-04 (×2): qty 30, 30d supply, fill #1
  Filled ????-??-??: fill #1

## 2024-04-12 NOTE — Patient Instructions (Signed)
 Medication Instructions:  STOP: Diltiazem  360 mg  START: Diltiazem  30 mg (1 tablet) twice daily START: Lisinopril  10 mg (1 tablet) daily  *If you need a refill on your cardiac medications before your next appointment, please call your pharmacy*  Lab Work: IN 2 weeks: BMET  If you have labs (blood work) drawn today and your tests are completely normal, you will receive your results only by: MyChart Message (if you have MyChart) OR A paper copy in the mail If you have any lab test that is abnormal or we need to change your treatment, we will call you to review the results.  Testing/Procedures: Your physician has requested that you have an echocardiogram. Echocardiography is a painless test that uses sound waves to create images of your heart. It provides your doctor with information about the size and shape of your heart and how well your hearts chambers and valves are working. This procedure takes approximately one hour. There are no restrictions for this procedure. Please do NOT wear cologne, perfume, aftershave, or lotions (deodorant is allowed). Please arrive 15 minutes prior to your appointment time.  Please note: We ask at that you not bring children with you during ultrasound (echo/ vascular) testing. Due to room size and safety concerns, children are not allowed in the ultrasound rooms during exams. Our front office staff cannot provide observation of children in our lobby area while testing is being conducted. An adult accompanying a patient to their appointment will only be allowed in the ultrasound room at the discretion of the ultrasound technician under special circumstances. We apologize for any inconvenience.   Follow-Up: At [facility], you and your health needs are our priority.  As part of our continuing mission to provide you with exceptional heart care, our providers are all part of one team.  This team includes your primary Cardiologist (physician) and Advanced  Practice Providers or APPs (Physician Assistants and Nurse Practitioners) who all work together to provide you with the care you need, when you need it.  Your next appointment:   4 week(s)  Provider:   Katlyn West, NP  We recommend signing up for the patient portal called MyChart.  Sign up information is provided on this After Visit Summary.  MyChart is used to connect with patients for Virtual Visits (Telemedicine).  Patients are able to view lab/test results, encounter notes, upcoming appointments, etc.  Non-urgent messages can be sent to your provider as well.   To learn more about what you can do with MyChart, go to [email or web address].   Other Instructions Check blood Pressure 2 time daily until next appointment

## 2024-04-15 ENCOUNTER — Other Ambulatory Visit (INDEPENDENT_AMBULATORY_CARE_PROVIDER_SITE_OTHER)

## 2024-04-15 DIAGNOSIS — R600 Localized edema: Secondary | ICD-10-CM | POA: Diagnosis not present

## 2024-04-15 DIAGNOSIS — R002 Palpitations: Secondary | ICD-10-CM | POA: Diagnosis not present

## 2024-04-15 DIAGNOSIS — R0602 Shortness of breath: Secondary | ICD-10-CM

## 2024-04-15 LAB — ECHOCARDIOGRAM COMPLETE
AR max vel: 1.93 cm2
AV Area VTI: 1.85 cm2
AV Area mean vel: 1.88 cm2
AV Mean grad: 4 mmHg
AV Peak grad: 7.1 mmHg
Ao pk vel: 1.33 m/s
Area-P 1/2: 3.6 cm2
S' Lateral: 2.95 cm

## 2024-04-16 ENCOUNTER — Ambulatory Visit: Payer: Self-pay | Admitting: Cardiology

## 2024-04-20 ENCOUNTER — Encounter

## 2024-04-20 DIAGNOSIS — R0683 Snoring: Secondary | ICD-10-CM

## 2024-04-23 ENCOUNTER — Encounter (INDEPENDENT_AMBULATORY_CARE_PROVIDER_SITE_OTHER): Payer: Self-pay | Admitting: *Deleted

## 2024-04-30 ENCOUNTER — Other Ambulatory Visit (HOSPITAL_COMMUNITY): Payer: Self-pay

## 2024-04-30 ENCOUNTER — Ambulatory Visit: Payer: Self-pay | Admitting: Cardiovascular Disease

## 2024-04-30 DIAGNOSIS — R002 Palpitations: Secondary | ICD-10-CM

## 2024-05-02 ENCOUNTER — Encounter: Payer: Self-pay | Admitting: Podiatry

## 2024-05-02 ENCOUNTER — Encounter: Payer: Self-pay | Admitting: Neurology

## 2024-05-02 ENCOUNTER — Ambulatory Visit (HOSPITAL_COMMUNITY): Attending: Podiatry

## 2024-05-02 NOTE — Therapy (Unsigned)
 " OUTPATIENT PHYSICAL THERAPY LOWER EXTREMITY EVALUATION   Patient Name: Sara Ramirez MRN: [id number] DOB:Jun 03, 1980, 44 y.o., female Today's Date: 05/02/2024  END OF SESSION:   Past Medical History:  Diagnosis Date   Anxiety    Hypertension    Kidney stone    Migraines    Mitral regurgitation    Renal disorder    kidney stones   Tricuspid regurgitation    Past Surgical History:  Procedure Laterality Date   CHOLECYSTECTOMY N/A 06/17/2020   Procedure: LAPAROSCOPIC CHOLECYSTECTOMY;  Surgeon: Kallie Manuelita BROCKS, MD;  Location: [facility];  Service: General;  Laterality: N/A;   CYSTOSCOPY W/ URETERAL STENT PLACEMENT Right 01/03/2013   Procedure: CYSTOSCOPY WITH RIGHT RETROGRADE PYELOGRAM/ RIGHT URETERAL STENT PLACEMENT;  Surgeon: Glendia DELENA Elizabeth, MD;  Location: [facility];  Service: Urology;  Laterality: Right;   LITHOTRIPSY     TYMPANOSTOMY TUBE PLACEMENT Bilateral 2002   Patient Active Problem List   Diagnosis Date Noted   Emotional instability (HCC) 04/05/2024   LDL-c greater than or equal to 100 mg/dl 98/97/7973   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   Arthralgia 06/27/2022   Fatigue 06/27/2022   Menopausal and female climacteric states 06/27/2022   Other specified health status 06/27/2022   Reduced libido 06/27/2022   Vitamin D  deficiency 06/27/2022   Nausea and vomiting 06/07/2022   Kidney stone 06/07/2022   Nephrolithiasis 06/07/2022   Migraine 06/07/2022   Hypertensive disorder 05/24/2022   Obstructive jaundice (HCC) 11/28/2021   Intractable epigastric abdominal pain 11/27/2021   Sleep disturbance 09/09/2021   GAD (generalized anxiety disorder) 08/03/2021   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/03/2021   Cholelithiasis 06/16/2020   Abdominal pain 06/16/2020   Nausea & vomiting 06/16/2020   Symptomatic cholelithiasis 06/16/2020   Biliary colic 06/15/2020   B12 deficiency 07/24/2015   Morbid obesity (HCC) 07/22/2015   Palpitations  07/22/2015   Other chest pain 10/02/2014   Indigestion 10/02/2014   Situational anxiety 10/02/2014   Migraine headache 11/19/2013   Primary hypertension 11/19/2013    PCP: Billy Philippe SAUNDERS, NP    REFERRING PROVIDER: Lamount Ethan CROME, DPM   REFERRING DIAG: Recalcitrant right plantar fasciitis   THERAPY DIAG:  No diagnosis found.  Rationale for Evaluation and Treatment: Rehabilitation  ONSET DATE: ***  SUBJECTIVE:   SUBJECTIVE STATEMENT: ***  PERTINENT HISTORY: *** PAIN:  Are you having pain? Yes: NPRS scale: *** Pain location: *** Pain description: *** Aggravating factors: *** Relieving factors: ***  PRECAUTIONS: {Therapy precautions:24002}  RED FLAGS: {PT Red Flags:29287}   WEIGHT BEARING RESTRICTIONS: {Yes ***/No:24003}  FALLS:  Has patient fallen in last 6 months? {fallsyesno:27318}  LIVING ENVIRONMENT: Lives with: {OPRC lives with:25569::lives with their family} Lives in: {Lives in:25570} Stairs: {opstairs:27293} Has following equipment at home: {Assistive devices:23999}  OCCUPATION: ***  PLOF: {PLOF:24004}  PATIENT GOALS: ***  NEXT MD VISIT: ***  OBJECTIVE:  Note: Objective measures were completed at Evaluation unless otherwise noted.  PATIENT SURVEYS:  LEFS  Extreme difficulty/unable (0), Quite a bit of difficulty (1), Moderate difficulty (2), Little difficulty (3), No difficulty (4) Survey date:    Any of your usual work, housework or school activities   2. Usual hobbies, recreational or sporting activities   3. Getting into/out of the bath   4. Walking between rooms   5. Putting on socks/shoes   6. Squatting    7. Lifting an object, like a bag of groceries from the floor   8. Performing light activities around your home  9. Performing heavy activities around your home   10. Getting into/out of a car   11. Walking 2 blocks   12. Walking 1 mile   13. Going up/down 10 stairs (1 flight)   14. Standing for 1 hour   15.  sitting for  1 hour   16. Running on even ground   17. Running on uneven ground   18. Making sharp turns while running fast   19. Hopping    20. Rolling over in bed   Score total:  ***     COGNITION: Overall cognitive status: {cognition:24006}     SENSATION: {sensation:27233}  EDEMA:  {edema:24020}   POSTURE: {posture:25561}  PALPATION: ***  LOWER EXTREMITY ROM:  {AROM/PROM:27142} ROM Right eval Left eval  Hip flexion    Hip extension    Hip abduction    Hip adduction    Hip internal rotation    Hip external rotation    Knee flexion    Knee extension    Ankle dorsiflexion    Ankle plantarflexion    Ankle inversion    Ankle eversion     (Blank rows = not tested)  LOWER EXTREMITY MMT:  MMT Right eval Left eval  Hip flexion    Hip extension    Hip abduction    Hip adduction    Hip internal rotation    Hip external rotation    Knee flexion    Knee extension    Ankle dorsiflexion    Ankle plantarflexion    Ankle inversion    Ankle eversion     (Blank rows = not tested)  LOWER EXTREMITY SPECIAL TESTS:  {LEspecialtests:26242}  FUNCTIONAL TESTS:  2 minute walk test: ***  GAIT: Distance walked: *** Assistive device utilized: {Assistive devices:23999} Level of assistance: {Levels of assistance:24026} Comments: ***                                                                                                                                TREATMENT DATE:  05/02/24: Eval, HEP, and pt education.   PATIENT EDUCATION:  Education details: *** Person educated: {Person educated:25204} Education method: {Education Method:25205} Education comprehension: {Education Comprehension:25206}  HOME EXERCISE PROGRAM: ***  ASSESSMENT:  CLINICAL IMPRESSION: Patient is a 44 y.o. female who was seen today for physical therapy evaluation and treatment for recalcitrant right plantar fasciitis .   OBJECTIVE IMPAIRMENTS: {opptimpairments:25111}.   ACTIVITY LIMITATIONS:  {activitylimitations:27494}  PARTICIPATION LIMITATIONS: {participationrestrictions:25113}  PERSONAL FACTORS: {Personal factors:25162} are also affecting patient's functional outcome.   REHAB POTENTIAL: {rehabpotential:25112}  CLINICAL DECISION MAKING: {clinical decision making:25114}  EVALUATION COMPLEXITY: {Evaluation complexity:25115}   GOALS: Goals reviewed with patient? {yes/no:20286}  SHORT TERM GOALS: Target date: *** *** Baseline: Goal status: INITIAL  2.  *** Baseline:  Goal status: INITIAL  3.  *** Baseline:  Goal status: INITIAL  4.  *** Baseline:  Goal status: INITIAL  5.  *** Baseline:  Goal status: INITIAL  6.  *** Baseline:  Goal status: INITIAL  LONG TERM GOALS: Target date: ***  *** Baseline:  Goal status: INITIAL  2.  *** Baseline:  Goal status: INITIAL  3.  *** Baseline:  Goal status: INITIAL  4.  *** Baseline:  Goal status: INITIAL  5.  *** Baseline:  Goal status: INITIAL  6.  *** Baseline:  Goal status: INITIAL   PLAN:  PT FREQUENCY: {rehab frequency:25116}  PT DURATION: {rehab duration:25117}  PLANNED INTERVENTIONS: {rehab planned interventions:25118::97110-Therapeutic exercises,97530- Therapeutic [phone]- Neuromuscular re-education,97535- Self Rjmz,02859- Manual therapy,Patient/Family education}  PLAN FOR NEXT SESSION: ***   Venus Galeazzi, Student-PT 05/02/2024, 7:32 AM  "

## 2024-05-03 ENCOUNTER — Encounter (HOSPITAL_COMMUNITY): Payer: Self-pay

## 2024-05-04 ENCOUNTER — Other Ambulatory Visit (HOSPITAL_COMMUNITY): Payer: Self-pay

## 2024-05-07 ENCOUNTER — Telehealth: Payer: Self-pay

## 2024-05-07 ENCOUNTER — Other Ambulatory Visit: Payer: Self-pay | Admitting: Neurology

## 2024-05-07 DIAGNOSIS — R0683 Snoring: Secondary | ICD-10-CM | POA: Diagnosis not present

## 2024-05-07 DIAGNOSIS — G4733 Obstructive sleep apnea (adult) (pediatric): Secondary | ICD-10-CM

## 2024-05-07 MED ORDER — AJOVY 225 MG/1.5ML ~~LOC~~ SOAJ
225.0000 mg | SUBCUTANEOUS | 11 refills | Status: AC
  Filled 2024-05-09: qty 1.5, 28d supply, fill #0

## 2024-05-07 NOTE — Telephone Encounter (Signed)
 Please let patient know sleep study -O2 sat was not connected. We will need to repeat Home sleep study or go for in lab study to better evaluate. Whichever she would like to do I'm glad to order.

## 2024-05-07 NOTE — Telephone Encounter (Signed)
 Oximetry not present on home sleep study.  Will need repeat.  Primary sleep provider has been notified.

## 2024-05-09 ENCOUNTER — Other Ambulatory Visit: Payer: Self-pay

## 2024-05-09 ENCOUNTER — Other Ambulatory Visit (HOSPITAL_COMMUNITY): Payer: Self-pay

## 2024-05-09 NOTE — Telephone Encounter (Signed)
 ATC pt, unable to LVM as VM box was not set up. Sent MyChart message to pt.

## 2024-05-09 NOTE — Telephone Encounter (Signed)
 Needs in lab sleep study , ordered

## 2024-05-09 NOTE — Addendum Note (Signed)
 Addended by: ORLIE MADELIN RAMAN on: 05/09/2024 12:49 PM   Modules accepted: Orders

## 2024-05-09 NOTE — Telephone Encounter (Signed)
 PA not needed for Ajovy . Pharmacy Patient Advocate Encounter  Insurance verification completed.   The patient is insured through [facility]   Ran test claim for AJOVY  225MG . Currently a quantity of 1.5ML is a 30 day supply and the co-pay is 138.04 . The current 30 day co-pay is, $138.04.  No PA needed at this time.  This test claim was processed through [facility]- copay amounts may vary at other pharmacies due to pharmacy/plan contracts, or as the patient moves through the different stages of their insurance plan.

## 2024-05-10 ENCOUNTER — Ambulatory Visit: Admitting: Cardiology

## 2024-05-10 ENCOUNTER — Other Ambulatory Visit (HOSPITAL_COMMUNITY): Payer: Self-pay

## 2024-05-10 VITALS — BP 122/72 | HR 68 | Ht 63.0 in | Wt 231.2 lb

## 2024-05-10 DIAGNOSIS — G4733 Obstructive sleep apnea (adult) (pediatric): Secondary | ICD-10-CM

## 2024-05-10 DIAGNOSIS — I10 Essential (primary) hypertension: Secondary | ICD-10-CM

## 2024-05-10 DIAGNOSIS — R0602 Shortness of breath: Secondary | ICD-10-CM

## 2024-05-10 DIAGNOSIS — R002 Palpitations: Secondary | ICD-10-CM

## 2024-05-10 NOTE — Patient Instructions (Signed)
 Medication Instructions:  Your physician recommends that you continue on your current medications as directed. Please refer to the Current Medication list given to you today.  *If you need a refill on your cardiac medications before your next appointment, please call your pharmacy*  Follow-Up: At [facility] you and your health needs are our priority.  As part of our continuing mission to provide you with exceptional heart care, our providers are all part of one team.  This team includes your primary Cardiologist (physician) and Advanced Practice Providers or APPs (Physician Assistants and Nurse Practitioners) who all work together to provide you with the care you need, when you need it.  Your next appointment:   6 month(s)  Provider:   Katlyn West, NP

## 2024-05-16 ENCOUNTER — Ambulatory Visit: Admitting: Podiatry

## 2024-05-16 ENCOUNTER — Ambulatory Visit: Admitting: Internal Medicine

## 2024-05-17 ENCOUNTER — Ambulatory Visit: Admitting: Adult Health

## 2024-05-22 ENCOUNTER — Other Ambulatory Visit

## 2024-07-18 ENCOUNTER — Ambulatory Visit (HOSPITAL_BASED_OUTPATIENT_CLINIC_OR_DEPARTMENT_OTHER): Admitting: Pulmonary Disease

## 2024-08-15 ENCOUNTER — Ambulatory Visit: Admitting: Family Medicine

## 2024-10-03 ENCOUNTER — Ambulatory Visit: Admitting: Neurology

## 2024-11-11 ENCOUNTER — Ambulatory Visit: Admitting: Physician Assistant
